# Patient Record
Sex: Male | Born: 1950 | Race: White | Hispanic: No | Marital: Married | Smoking: Never smoker
Health system: Southern US, Community
[De-identification: ages and names within clinical notes are randomized; demographics above are authoritative.]

## PROBLEM LIST (undated history)

## (undated) DIAGNOSIS — E782 Mixed hyperlipidemia: Secondary | ICD-10-CM

## (undated) DIAGNOSIS — N501 Vascular disorders of male genital organs: Secondary | ICD-10-CM

## (undated) DIAGNOSIS — G44229 Chronic tension-type headache, not intractable: Secondary | ICD-10-CM

## (undated) DIAGNOSIS — L409 Psoriasis, unspecified: Secondary | ICD-10-CM

## (undated) DIAGNOSIS — E1140 Type 2 diabetes mellitus with diabetic neuropathy, unspecified: Secondary | ICD-10-CM

## (undated) DIAGNOSIS — I10 Essential (primary) hypertension: Secondary | ICD-10-CM

## (undated) HISTORY — DX: Chronic tension-type headache, not intractable: G44.229

## (undated) HISTORY — DX: Type 2 diabetes mellitus with diabetic neuropathy, unspecified: E11.40

## (undated) HISTORY — DX: Mixed hyperlipidemia: E78.2

## (undated) HISTORY — DX: Essential (primary) hypertension: I10

## (undated) HISTORY — DX: Vascular disorders of male genital organs: N50.1

## (undated) HISTORY — PX: ANKLE SURGERY: SHX546

## (undated) HISTORY — DX: Psoriasis, unspecified: L40.9

---

## 2007-02-01 ENCOUNTER — Ambulatory Visit: Payer: Self-pay | Admitting: Internal Medicine

## 2007-03-20 ENCOUNTER — Emergency Department: Payer: Self-pay | Admitting: Emergency Medicine

## 2010-02-20 HISTORY — PX: COLECTOMY: SHX59

## 2010-05-23 ENCOUNTER — Inpatient Hospital Stay: Payer: Self-pay | Admitting: Surgery

## 2010-05-25 LAB — CEA: CEA: 1 ng/mL (ref 0.0–4.7)

## 2010-05-27 DIAGNOSIS — I472 Ventricular tachycardia: Secondary | ICD-10-CM

## 2010-05-30 LAB — PATHOLOGY REPORT

## 2010-06-25 ENCOUNTER — Inpatient Hospital Stay: Payer: Self-pay | Admitting: Internal Medicine

## 2011-01-03 LAB — HM COLONOSCOPY

## 2011-10-20 ENCOUNTER — Ambulatory Visit: Payer: Self-pay | Admitting: Internal Medicine

## 2012-01-05 ENCOUNTER — Ambulatory Visit: Payer: Self-pay | Admitting: Unknown Physician Specialty

## 2014-02-02 ENCOUNTER — Ambulatory Visit: Payer: Self-pay | Admitting: Family Medicine

## 2014-02-04 DIAGNOSIS — R51 Headache: Secondary | ICD-10-CM

## 2014-02-04 DIAGNOSIS — M5481 Occipital neuralgia: Secondary | ICD-10-CM | POA: Insufficient documentation

## 2014-02-04 DIAGNOSIS — R519 Headache, unspecified: Secondary | ICD-10-CM | POA: Insufficient documentation

## 2017-02-07 DIAGNOSIS — M1710 Unilateral primary osteoarthritis, unspecified knee: Secondary | ICD-10-CM | POA: Insufficient documentation

## 2017-02-07 DIAGNOSIS — M179 Osteoarthritis of knee, unspecified: Secondary | ICD-10-CM | POA: Insufficient documentation

## 2017-03-07 ENCOUNTER — Other Ambulatory Visit: Payer: Self-pay

## 2017-03-07 ENCOUNTER — Encounter: Payer: Self-pay | Admitting: Internal Medicine

## 2017-03-07 ENCOUNTER — Ambulatory Visit (INDEPENDENT_AMBULATORY_CARE_PROVIDER_SITE_OTHER): Payer: PPO | Admitting: Internal Medicine

## 2017-03-07 VITALS — BP 139/59 | HR 75 | Resp 16 | Ht 69.0 in | Wt 177.8 lb

## 2017-03-07 DIAGNOSIS — E1140 Type 2 diabetes mellitus with diabetic neuropathy, unspecified: Secondary | ICD-10-CM

## 2017-03-07 DIAGNOSIS — Z9049 Acquired absence of other specified parts of digestive tract: Secondary | ICD-10-CM

## 2017-03-07 DIAGNOSIS — I152 Hypertension secondary to endocrine disorders: Secondary | ICD-10-CM | POA: Insufficient documentation

## 2017-03-07 DIAGNOSIS — I10 Essential (primary) hypertension: Secondary | ICD-10-CM

## 2017-03-07 DIAGNOSIS — E781 Pure hyperglyceridemia: Secondary | ICD-10-CM

## 2017-03-07 DIAGNOSIS — Z125 Encounter for screening for malignant neoplasm of prostate: Secondary | ICD-10-CM | POA: Diagnosis not present

## 2017-03-07 DIAGNOSIS — E785 Hyperlipidemia, unspecified: Secondary | ICD-10-CM | POA: Insufficient documentation

## 2017-03-07 LAB — POCT GLYCOSYLATED HEMOGLOBIN (HGB A1C): Hemoglobin A1C: 6.7

## 2017-03-07 MED ORDER — GABAPENTIN 300 MG PO CAPS: 600.0000 mg | ORAL_CAPSULE | Freq: Three times a day (TID) | ORAL | 6 refills | Status: DC

## 2017-03-07 MED ORDER — GLIMEPIRIDE 1 MG PO TABS: ORAL_TABLET | ORAL | 3 refills | Status: DC

## 2017-03-07 MED ORDER — METOPROLOL TARTRATE 50 MG PO TABS: 50.0000 mg | ORAL_TABLET | Freq: Two times a day (BID) | ORAL | 6 refills | Status: DC

## 2017-03-07 MED ORDER — METOPROLOL SUCCINATE ER 50 MG PO TB24: 50.0000 mg | ORAL_TABLET | Freq: Two times a day (BID) | ORAL | 6 refills | Status: DC

## 2017-03-07 MED ORDER — GEMFIBROZIL 600 MG PO TABS: 600.0000 mg | ORAL_TABLET | Freq: Two times a day (BID) | ORAL | 6 refills | Status: DC

## 2017-03-07 MED ORDER — LISINOPRIL 10 MG PO TABS: 10.0000 mg | ORAL_TABLET | Freq: Every day | ORAL | 6 refills | Status: DC

## 2017-03-07 MED ORDER — GABAPENTIN 600 MG PO TABS: 600.0000 mg | ORAL_TABLET | Freq: Three times a day (TID) | ORAL | 6 refills | Status: DC

## 2017-03-07 NOTE — Telephone Encounter (Signed)
Called in phar freestyle teststrips and lancets 28 g with 10o with 3 refill as per dr [facility]

## 2017-03-07 NOTE — Progress Notes (Signed)
[facility] [place], [place] [address]  Internal MEDICINE  Office Visit Note  Patient Name: Shawn Greene  [id number]  [id number]  Date of Service: 03/07/2017   Hyperglycemia  Other  This is a new problem. The current episode started more than 1 month ago. The problem occurs intermittently. The problem has been gradually worsening. Associated symptoms include arthralgias. Pertinent negatives include no fatigue. The symptoms are aggravated by eating. The treatment provided no relief.    Pt is here for routine follow up.    Current Medication: Outpatient Encounter Medications as of 03/07/2017  Medication Sig  . gabapentin (NEURONTIN) 300 MG capsule Take 600 mg by mouth.  Marland Kitchen aspirin EC 81 MG tablet Take 81 mg by mouth.  Marland Kitchen gemfibrozil (LOPID) 600 MG tablet Take 600 mg by mouth.  Marland Kitchen lisinopril (PRINIVIL,ZESTRIL) 10 MG tablet Take 10 mg by mouth.  . metoprolol succinate (TOPROL-XL) 50 MG 24 hr tablet Take 50 mg by mouth 2 (two) times daily.   No facility-administered encounter medications on file as of 03/07/2017.     Surgical History: Past Surgical History:  Procedure Laterality Date  . ANKLE SURGERY    . COLECTOMY  2012    Medical History: Past Medical History:  Diagnosis Date  . Chronic tension-type headache, not intractable   . Essential (primary) hypertension   . Mixed hyperlipidemia   . Psoriasis   . Type 2 diabetes, controlled, with neuropathy ([place])   . 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 Hypertension Mother     Social History   Socioeconomic History  . Marital status: Married    Spouse name: Not on file  . Number of children: Not on file  . Years of education: Not on file  . Highest education level: Not on file  Social Needs  . Financial resource strain: Not on file  . Food insecurity - worry: Not on file  . Food insecurity - inability: Not on file  .  Transportation needs - medical: Not on file  . Transportation needs - non-medical: Not on file  Occupational History  . Not on file  Tobacco Use  . Smoking status: Never Smoker  . Smokeless tobacco: Never Used  Substance and Sexual Activity  . Alcohol use: Yes    Alcohol/week: 0.6 oz    Types: 1 Cans of beer per week    Comment: occational  . Drug use: No  . Sexual activity: Not on file  Other Topics Concern  . Not on file  Social History Narrative  . Not on file      Review of Systems  Constitutional: Negative.  Negative for fatigue.  HENT: Negative.   Eyes: Negative.   Respiratory: Negative.   Cardiovascular: Negative.   Musculoskeletal: Positive for arthralgias.    Vital Signs: BP (!) 139/59 (BP Location: Left Arm, Patient Position: Sitting)   Pulse 75   Resp 16   Ht 5\' 9"  (1.753 m)   Wt 177 lb 12.8 oz (80.6 kg)   SpO2 97%   BMI 26.26 kg/m    Physical Exam  Constitutional: He is oriented to person, place, and time. He appears well-developed and well-nourished. No distress.  HENT:  Head: Normocephalic and atraumatic.  Mouth/Throat: Oropharynx is clear and moist. No oropharyngeal exudate.  Eyes: EOM are normal. Pupils are equal, round, and reactive to light.  Neck: Normal range of motion. Neck  supple.  Cardiovascular: Normal rate, regular rhythm and normal heart sounds.  Pulmonary/Chest: Effort normal. He has no rales.  Abdominal: Soft. Bowel sounds are normal.  Neurological: He is alert and oriented to person, place, and time. No cranial nerve deficit.  Skin: Skin is warm and dry. He is not diaphoretic.  Psychiatric: He has a normal mood and affect.     Assessment/Plan: 1. Type 2 diabetes mellitus with diabetic neuropathy, without long-term current use of insulin (HCC)  - B12 for neuropthy - Continue gabapentin (NEURONTIN) 300 MG capsule; Take 2 capsules (600 mg total) by mouth 3 (three) times daily.  Dispense: 90 capsule; Refill: 6 - Start   glimepiride (AMARYL) 1 MG tablet; Take one tab po qd with lunch  Dispense: 30 tablet; Refill: 3 - CBC with Differential/Platelet - TSH - T4, free - Urinalysis  2. Essential hypertension, benign - Continue - metoprolol succinate (TOPROL-XL) 50 MG 24 hr tablet; Take 1 tablet (50 mg total) by mouth 2 (two) times daily.  Dispense: 60 tablet; Refill: 6 - lisinopril (PRINIVIL,ZESTRIL) 10 MG tablet; Take 1 tablet (10 mg total) by mouth daily.  Dispense: 30 tablet; Refill: 6 - Comprehensive metabolic panel  3. S/P partial resection of colon - Stable, pt developed a diverticular abscess   4. Pure hyperglyceridemia - Stable  - gemfibrozil (LOPID) 600 MG tablet; Take 1 tablet (600 mg total) by mouth 2 (two) times daily before a meal.  Dispense: 60 tablet; Refill: 6 - Lipid Panel With LDL/HDL Ratio  5. Screening PSA (prostate specific antigen) - PSA, total and free with CPE  General Counseling: yonathan perrow understanding of the findings of todays visit and agrees with plan of treatment. I have discussed any further diagnostic evaluation that may be needed or ordered today. We also reviewed his medications today. he has been encouraged to call the office with any questions or concerns that should arise related to todays visit.    Counseling: Diabetes Counseling:  1. Addition of ACE inh/ ARB'S for nephroprotection. 2. Diabetic foot care, prevention of complications.  3.Exercise and lose weight.  4. Diabetic eye examination, 5. Monitor blood sugar closlely. nutrition counseling.  6.Sign and symptoms of hypoglycemia including shaking sweating,confusion and headaches.   Dr [name] Internal medicine

## 2017-03-08 ENCOUNTER — Other Ambulatory Visit: Payer: Self-pay

## 2017-03-08 ENCOUNTER — Other Ambulatory Visit: Payer: Self-pay | Admitting: Internal Medicine

## 2017-03-08 DIAGNOSIS — I10 Essential (primary) hypertension: Secondary | ICD-10-CM

## 2017-03-08 MED ORDER — LISINOPRIL 20 MG PO TABS: ORAL_TABLET | ORAL | 6 refills | Status: DC

## 2017-03-08 MED ORDER — GLUCOCOM BLOOD GLUCOSE MONITOR DEVI: 1 refills | Status: DC

## 2017-03-09 ENCOUNTER — Ambulatory Visit: Payer: Self-pay | Admitting: Nurse Practitioner

## 2017-03-09 ENCOUNTER — Other Ambulatory Visit: Payer: Self-pay

## 2017-03-13 ENCOUNTER — Emergency Department
Admission: EM | Admit: 2017-03-13 | Discharge: 2017-03-13 | Discharge status: Absent without leave | Payer: PPO | Attending: Emergency Medicine | Admitting: Emergency Medicine

## 2017-03-15 LAB — VITAMIN B12: Vitamin B-12: 1192 pg/mL (ref 232–1245)

## 2017-05-30 LAB — CBC WITH DIFFERENTIAL/PLATELET
Basophils Absolute: 0.1 10*3/uL (ref 0.0–0.2)
Basos: 1 %
EOS (ABSOLUTE): 0.4 10*3/uL (ref 0.0–0.4)
Eos: 6 %
Hematocrit: 39.8 % (ref 37.5–51.0)
Hemoglobin: 14 g/dL (ref 13.0–17.7)
Immature Grans (Abs): 0 10*3/uL (ref 0.0–0.1)
Immature Granulocytes: 0 %
Lymphocytes Absolute: 2.4 10*3/uL (ref 0.7–3.1)
Lymphs: 41 %
MCH: 30.2 pg (ref 26.6–33.0)
MCHC: 35.2 g/dL (ref 31.5–35.7)
MCV: 86 fL (ref 79–97)
Monocytes Absolute: 0.7 10*3/uL (ref 0.1–0.9)
Monocytes: 12 %
Neutrophils Absolute: 2.3 10*3/uL (ref 1.4–7.0)
Neutrophils: 40 %
Platelets: 318 10*3/uL (ref 150–379)
RBC: 4.63 x10E6/uL (ref 4.14–5.80)
RDW: 12.9 % (ref 12.3–15.4)
WBC: 5.8 10*3/uL (ref 3.4–10.8)

## 2017-05-30 LAB — COMPREHENSIVE METABOLIC PANEL
ALT: 22 IU/L (ref 0–44)
AST: 20 IU/L (ref 0–40)
Albumin/Globulin Ratio: 1.5 (ref 1.2–2.2)
Albumin: 4.3 g/dL (ref 3.6–4.8)
Alkaline Phosphatase: 41 IU/L (ref 39–117)
BUN/Creatinine Ratio: 22 (ref 10–24)
BUN: 20 mg/dL (ref 8–27)
Bilirubin Total: 0.4 mg/dL (ref 0.0–1.2)
CO2: 21 mmol/L (ref 20–29)
Calcium: 9.5 mg/dL (ref 8.6–10.2)
Chloride: 102 mmol/L (ref 96–106)
Creatinine, Ser: 0.93 mg/dL (ref 0.76–1.27)
GFR calc Af Amer: 98 mL/min/{1.73_m2} (ref >59)
GFR calc non Af Amer: 85 mL/min/{1.73_m2} (ref >59)
Globulin, Total: 2.8 g/dL (ref 1.5–4.5)
Glucose: 99 mg/dL (ref 65–99)
Potassium: 4 mmol/L (ref 3.5–5.2)
Sodium: 138 mmol/L (ref 134–144)
Total Protein: 7.1 g/dL (ref 6.0–8.5)

## 2017-05-30 LAB — TSH: TSH: 2.44 u[IU]/mL (ref 0.450–4.500)

## 2017-05-30 LAB — PSA, TOTAL AND FREE
PSA, Free Pct: 36.7 %
PSA, Free: 0.33 ng/mL
Prostate Specific Ag, Serum: 0.9 ng/mL (ref 0.0–4.0)

## 2017-05-30 LAB — LIPID PANEL WITH LDL/HDL RATIO
Cholesterol, Total: 149 mg/dL (ref 100–199)
HDL: 33 mg/dL — ABNORMAL LOW (ref >39)
LDL Calculated: 88 mg/dL (ref 0–99)
LDl/HDL Ratio: 2.7 ratio (ref 0.0–3.6)
Triglycerides: 141 mg/dL (ref 0–149)
VLDL Cholesterol Cal: 28 mg/dL (ref 5–40)

## 2017-05-30 LAB — T4, FREE: Free T4: 1.15 ng/dL (ref 0.82–1.77)

## 2017-06-05 ENCOUNTER — Encounter: Payer: Self-pay | Admitting: Nurse Practitioner

## 2017-06-05 ENCOUNTER — Ambulatory Visit (INDEPENDENT_AMBULATORY_CARE_PROVIDER_SITE_OTHER): Payer: PPO | Admitting: Nurse Practitioner

## 2017-06-05 VITALS — BP 145/75 | HR 64 | Resp 16 | Ht 69.0 in | Wt 178.0 lb

## 2017-06-05 DIAGNOSIS — N401 Enlarged prostate with lower urinary tract symptoms: Secondary | ICD-10-CM | POA: Insufficient documentation

## 2017-06-05 DIAGNOSIS — E781 Pure hyperglyceridemia: Secondary | ICD-10-CM

## 2017-06-05 DIAGNOSIS — N400 Enlarged prostate without lower urinary tract symptoms: Secondary | ICD-10-CM | POA: Diagnosis not present

## 2017-06-05 DIAGNOSIS — Z0001 Encounter for general adult medical examination with abnormal findings: Secondary | ICD-10-CM

## 2017-06-05 DIAGNOSIS — R300 Dysuria: Secondary | ICD-10-CM

## 2017-06-05 DIAGNOSIS — E1140 Type 2 diabetes mellitus with diabetic neuropathy, unspecified: Secondary | ICD-10-CM | POA: Diagnosis not present

## 2017-06-05 DIAGNOSIS — I10 Essential (primary) hypertension: Secondary | ICD-10-CM

## 2017-06-05 DIAGNOSIS — E1165 Type 2 diabetes mellitus with hyperglycemia: Secondary | ICD-10-CM | POA: Diagnosis not present

## 2017-06-05 LAB — POCT GLYCOSYLATED HEMOGLOBIN (HGB A1C): Hemoglobin A1C: 5.7

## 2017-06-05 MED ORDER — TADALAFIL 5 MG PO TABS: 5.0000 mg | ORAL_TABLET | Freq: Every day | ORAL | 5 refills | Status: DC

## 2017-06-05 MED ORDER — METOPROLOL TARTRATE 50 MG PO TABS: 50.0000 mg | ORAL_TABLET | Freq: Two times a day (BID) | ORAL | 6 refills | Status: DC

## 2017-06-05 MED ORDER — GEMFIBROZIL 600 MG PO TABS: 600.0000 mg | ORAL_TABLET | Freq: Two times a day (BID) | ORAL | 6 refills | Status: DC

## 2017-06-05 MED ORDER — GABAPENTIN 600 MG PO TABS: 600.0000 mg | ORAL_TABLET | Freq: Three times a day (TID) | ORAL | 6 refills | Status: DC

## 2017-06-05 MED ORDER — GLIMEPIRIDE 1 MG PO TABS: ORAL_TABLET | ORAL | 3 refills | Status: DC

## 2017-06-05 MED ORDER — LISINOPRIL 20 MG PO TABS
ORAL_TABLET | ORAL | 6 refills | Status: DC
Start: 2017-06-05 — End: 2017-10-10

## 2017-06-05 NOTE — Progress Notes (Signed)
[facility] [place], [place] [address]  Internal MEDICINE  Office Visit Note  Patient Name: Shawn Greene  [id number]  [id number]  Date of Service: 06/05/2017  Chief Complaint  Patient presents with  . Diabetes    peripheral neuropathy     The patient is here for health maintenance exam. Today, he states that his blood sugars are doing well. Was started on glimepiride 1mg  daily. Has noted sugars are improved. States that even neuropathy in both feet has improved just a little.  He did see orthopedics due to left knee pain. Was given cortisone injection. This relieved a small amount of the pain, but started coming right back after a week or so. Will make another appointment for follow up.   Pt is here for routine health maintenance examination  Current Medication: Outpatient Encounter Medications as of 06/05/2017  Medication Sig  . aspirin EC 81 MG tablet Take 81 mg by mouth.  . Blood Glucose Monitoring Suppl (GLUCOCOM BLOOD GLUCOSE MONITOR) DEVI FREESTYLE LITE METER.  use as directed to check blood sugars  . gabapentin (NEURONTIN) 600 MG tablet Take 1 tablet (600 mg total) by mouth 3 (three) times daily.  Marland Kitchen gemfibrozil (LOPID) 600 MG tablet Take 1 tablet (600 mg total) by mouth 2 (two) times daily before a meal.  . glimepiride (AMARYL) 1 MG tablet Take one tab po qd with lunch  . glucose blood test strip 1 each by Other route daily. Use once a daily to check blood sugar (free style test strips )  . Lancets 28G MISC by Does not apply route daily.  Marland Kitchen lisinopril (PRINIVIL,ZESTRIL) 20 MG tablet Take one tab po qd  . metoprolol tartrate (LOPRESSOR) 50 MG tablet Take 1 tablet (50 mg total) by mouth 2 (two) times daily.  . tadalafil (CIALIS) 5 MG tablet Take 1 tablet (5 mg total) by mouth daily.  . [DISCONTINUED] gabapentin (NEURONTIN) 600 MG tablet Take 1 tablet (600 mg total) by mouth 3 (three) times daily.  . [DISCONTINUED] gemfibrozil (LOPID) 600 MG tablet  Take 1 tablet (600 mg total) by mouth 2 (two) times daily before a meal.  . [DISCONTINUED] glimepiride (AMARYL) 1 MG tablet Take one tab po qd with lunch  . [DISCONTINUED] lisinopril (PRINIVIL,ZESTRIL) 20 MG tablet Take one tab po qd  . [DISCONTINUED] metoprolol tartrate (LOPRESSOR) 50 MG tablet Take 1 tablet (50 mg total) by mouth 2 (two) times daily.   No facility-administered encounter medications on file as of 06/05/2017.     Surgical History: Past Surgical History:  Procedure Laterality Date  . ANKLE SURGERY    . COLECTOMY  2012    Medical History: Past Medical History:  Diagnosis Date  . Chronic tension-type headache, not intractable   . Essential (primary) hypertension   . Mixed hyperlipidemia   . Psoriasis   . Type 2 diabetes, controlled, with neuropathy ([place])   . 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 Hypertension Mother       Review of Systems  Constitutional: Negative for activity change, chills, fatigue and unexpected weight change.  HENT: Negative for congestion, postnasal drip, rhinorrhea, sneezing and sore throat.   Eyes: Negative.  Negative for redness.  Respiratory: Negative for cough, chest tightness, shortness of breath and wheezing.   Cardiovascular: Negative for chest pain and palpitations.  Gastrointestinal: Negative for abdominal pain, constipation, diarrhea, nausea and vomiting.  Endocrine: Negative  for cold intolerance, heat intolerance, polydipsia, polyphagia and polyuria.       Blood sugars doing well   Genitourinary: Negative for dysuria and frequency.  Musculoskeletal: Positive for arthralgias. Negative for back pain, joint swelling and neck pain.       Left knee pain  Skin: Negative for rash.  Allergic/Immunologic: Negative for environmental allergies.  Neurological: Positive for numbness. Negative for tremors.  Hematological: Negative for adenopathy. Does not  bruise/bleed easily.  Psychiatric/Behavioral: Negative for behavioral problems (Depression), dysphoric mood, sleep disturbance and suicidal ideas. The patient is not nervous/anxious.      Today's Vitals   06/05/17 0913  BP: (!) 145/75  Pulse: 64  Resp: 16  SpO2: 98%  Weight: 178 lb (80.7 kg)  Height: 5\' 9"  (1.753 m)   Physical Exam  Constitutional: He is oriented to person, place, and time. He appears well-developed and well-nourished. No distress.  HENT:  Head: Normocephalic and atraumatic.  Mouth/Throat: Oropharynx is clear and moist. No oropharyngeal exudate.  Eyes: Pupils are equal, round, and reactive to light. Conjunctivae and EOM are normal.  Neck: Normal range of motion. Neck supple. No JVD present. Carotid bruit is not present.  Cardiovascular: Normal rate, regular rhythm, normal heart sounds and intact distal pulses.  Pulmonary/Chest: Effort normal and breath sounds normal. He has no wheezes. He has no rales.  Abdominal: Soft. Bowel sounds are normal. There is no tenderness.  Musculoskeletal: Normal range of motion.  Neurological: He is alert and oriented to person, place, and time. No cranial nerve deficit.  Skin: Skin is warm and dry. Capillary refill takes 2 to 3 seconds. He is not diaphoretic.  Psychiatric: He has a normal mood and affect. His behavior is normal. Judgment and thought content normal.  Nursing note and vitals reviewed.    LABS: Recent Results (from the past 2160 hour(s))  B12     Status: None   Collection Time: 03/14/17  8:27 AM  Result Value Ref Range   Vitamin B-12 1,192 232 - 1,245 pg/mL  CBC with Differential/Platelet     Status: None   Collection Time: 05/29/17  8:38 AM  Result Value Ref Range   WBC 5.8 3.4 - 10.8 x10E3/uL   RBC 4.63 4.14 - 5.80 x10E6/uL   Hemoglobin 14.0 13.0 - 17.7 g/dL   Hematocrit 39.8 37.5 - 51.0 %   MCV 86 79 - 97 fL   MCH 30.2 26.6 - 33.0 pg   MCHC 35.2 31.5 - 35.7 g/dL   RDW 12.9 12.3 - 15.4 %   Platelets 318  150 - 379 x10E3/uL   Neutrophils 40 Not Estab. %   Lymphs 41 Not Estab. %   Monocytes 12 Not Estab. %   Eos 6 Not Estab. %   Basos 1 Not Estab. %   Neutrophils Absolute 2.3 1.4 - 7.0 x10E3/uL   Lymphocytes Absolute 2.4 0.7 - 3.1 x10E3/uL   Monocytes Absolute 0.7 0.1 - 0.9 x10E3/uL   EOS (ABSOLUTE) 0.4 0.0 - 0.4 x10E3/uL   Basophils Absolute 0.1 0.0 - 0.2 x10E3/uL   Immature Granulocytes 0 Not Estab. %   Immature Grans (Abs) 0.0 0.0 - 0.1 x10E3/uL  Lipid Panel With LDL/HDL Ratio     Status: Abnormal   Collection Time: 05/29/17  8:38 AM  Result Value Ref Range   Cholesterol, Total 149 100 - 199 mg/dL   Triglycerides 141 0 - 149 mg/dL   HDL 33 (L) >39 mg/dL   VLDL Cholesterol Cal 28 5 - 40  mg/dL   LDL Calculated 88 0 - 99 mg/dL   LDl/HDL Ratio 2.7 0.0 - 3.6 ratio    Comment:                                     LDL/HDL Ratio                                             Men  Women                               1/2 Avg.Risk  1.0    1.5                                   Avg.Risk  3.6    3.2                                2X Avg.Risk  6.2    5.0                                3X Avg.Risk  8.0    6.1   TSH     Status: None   Collection Time: 05/29/17  8:38 AM  Result Value Ref Range   TSH 2.440 0.450 - 4.500 uIU/mL  T4, free     Status: None   Collection Time: 05/29/17  8:38 AM  Result Value Ref Range   Free T4 1.15 0.82 - 1.77 ng/dL  Comprehensive metabolic panel     Status: None   Collection Time: 05/29/17  8:38 AM  Result Value Ref Range   Glucose 99 65 - 99 mg/dL   BUN 20 8 - 27 mg/dL   Creatinine, Ser 0.93 0.76 - 1.27 mg/dL   GFR calc non Af Amer 85 >59 mL/min/1.73   GFR calc Af Amer 98 >59 mL/min/1.73   BUN/Creatinine Ratio 22 10 - 24   Sodium 138 134 - 144 mmol/L   Potassium 4.0 3.5 - 5.2 mmol/L   Chloride 102 96 - 106 mmol/L   CO2 21 20 - 29 mmol/L   Calcium 9.5 8.6 - 10.2 mg/dL   Total Protein 7.1 6.0 - 8.5 g/dL   Albumin 4.3 3.6 - 4.8 g/dL   Globulin, Total 2.8  1.5 - 4.5 g/dL   Albumin/Globulin Ratio 1.5 1.2 - 2.2   Bilirubin Total 0.4 0.0 - 1.2 mg/dL   Alkaline Phosphatase 41 39 - 117 IU/L   AST 20 0 - 40 IU/L   ALT 22 0 - 44 IU/L  PSA, total and free     Status: None   Collection Time: 05/29/17  8:38 AM  Result Value Ref Range   Prostate Specific Ag, Serum 0.9 0.0 - 4.0 ng/mL    Comment: Roche ECLIA methodology. According to the American Urological Association, Serum PSA should decrease and remain at undetectable levels after radical prostatectomy. The AUA defines biochemical recurrence as an initial PSA value 0.2 ng/mL or greater followed by a subsequent confirmatory PSA value 0.2 ng/mL or greater. Values obtained with different assay  methods or kits cannot be used interchangeably. Results cannot be interpreted as absolute evidence of the presence or absence of malignant disease.    PSA, Free 0.33 N/A ng/mL    Comment: Roche ECLIA methodology.   PSA, Free Pct 36.7 %    Comment: The table below lists the probability of prostate cancer for men with non-suspicious DRE results and total PSA between 4 and 10 ng/mL, by patient age Ricci Barker, [place], [id number]).                   % Free PSA       50-64 yr        65-75 yr                   0.00-10.00%        56%             55%                  10.01-15.00%        24%             35%                  15.01-20.00%        17%             23%                  20.01-25.00%        10%             20%                       >25.00%         5%              9% Please note:  Catalona et al did not make specific               recommendations regarding the use of               percent free PSA for any other population               of men.   POCT HgB A1C     Status: None   Collection Time: 06/05/17  9:39 AM  Result Value Ref Range   Hemoglobin A1C 5.7    Assessment/Plan: 1. Encounter for general adult medical examination with abnormal findings Annual wellness visit today.  2. Type 2  diabetes mellitus with diabetic neuropathy, without long-term current use of insulin ([place]) Diabetes well controlled. Tolerating amaryl well. Continue daily. Continue gabapentin as prescribed  - gabapentin (NEURONTIN) 600 MG tablet; Take 1 tablet (600 mg total) by mouth 3 (three) times daily.  Dispense: 90 tablet; Refill: 6 - glimepiride (AMARYL) 1 MG tablet; Take one tab po qd with lunch  Dispense: 30 tablet; Refill: 3  3. Uncontrolled type 2 diabetes mellitus with hyperglycemia (HCC) - POCT HgB A1C 5.7 today. Continue amaryl as prescribed   4. Essential hypertension, benign Well controlled. Continue bp medications as prescribed  - lisinopril (PRINIVIL,ZESTRIL) 20 MG tablet; Take one tab po qd  Dispense: 30 tablet; Refill: 6 - metoprolol tartrate (LOPRESSOR) 50 MG tablet; Take 1 tablet (50 mg total) by mouth 2 (two) times daily.  Dispense: 60 tablet; Refill: 6  5. Pure hyperglyceridemia - gemfibrozil (LOPID) 600 MG tablet; Take 1 tablet (600 mg total) by  mouth 2 (two) times daily before a meal.  Dispense: 60 tablet; Refill: 6  6. Benign prostatic hyperplasia without lower urinary tract symptoms - tadalafil (CIALIS) 5 MG tablet; Take 1 tablet (5 mg total) by mouth daily.  Dispense: 30 tablet; Refill: 5  7. Dysuria - Urinalysis, Routine w reflex microscopic  General Counseling: Shawn Greene understanding of the findings of todays visit and agrees with plan of treatment. I have discussed any further diagnostic evaluation that may be needed or ordered today. We also reviewed his medications today. he has been encouraged to call the office with any questions or concerns that should arise related to todays visit.  Diabetes Counseling:  1. Addition of ACE inh/ ARB'S for nephroprotection. 2. Diabetic foot care, prevention of complications.  3.Exercise and lose weight.  4. Diabetic eye examination, 5. Monitor blood sugar closlely. nutrition counseling.  6.Sign and symptoms of hypoglycemia  including shaking sweating,confusion and headaches.   This patient was seen by Leretha Pol, FNP- C in Collaboration with Dr [name] as a part of collaborative care agreement    Orders Placed This Encounter  Procedures  . Urinalysis, Routine w reflex microscopic  . POCT HgB A1C    Meds ordered this encounter  Medications  . gabapentin (NEURONTIN) 600 MG tablet    Sig: Take 1 tablet (600 mg total) by mouth 3 (three) times daily.    Dispense:  90 tablet    Refill:  6    Order Specific Question:   Supervising Provider    Answer:   [name] [[id number]]  . gemfibrozil (LOPID) 600 MG tablet    Sig: Take 1 tablet (600 mg total) by mouth 2 (two) times daily before a meal.    Dispense:  60 tablet    Refill:  6    Order Specific Question:   Supervising Provider    Answer:   [name] [place]  . glimepiride (AMARYL) 1 MG tablet    Sig: Take one tab po qd with lunch    Dispense:  30 tablet    Refill:  3    Order Specific Question:   Supervising Provider    Answer:   [name] [place]  . lisinopril (PRINIVIL,ZESTRIL) 20 MG tablet    Sig: Take one tab po qd    Dispense:  30 tablet    Refill:  6    Order Specific Question:   Supervising Provider    Answer:   [name] [[id number]]  . metoprolol tartrate (LOPRESSOR) 50 MG tablet    Sig: Take 1 tablet (50 mg total) by mouth 2 (two) times daily.    Dispense:  60 tablet    Refill:  6    Order Specific Question:   Supervising Provider    Answer:   [name] [[id number]]  . tadalafil (CIALIS) 5 MG tablet    Sig: Take 1 tablet (5 mg total) by mouth daily.    Dispense:  30 tablet    Refill:  5    Order Specific Question:   Supervising Provider    Answer:   [name] [[id number]]    Time spent: [place], MD  Internal Medicine

## 2017-06-06 LAB — URINALYSIS, ROUTINE W REFLEX MICROSCOPIC
Bilirubin, UA: NEGATIVE
Glucose, UA: NEGATIVE
Ketones, UA: NEGATIVE
Leukocytes, UA: NEGATIVE
Nitrite, UA: NEGATIVE
Protein, UA: NEGATIVE
RBC, UA: NEGATIVE
Specific Gravity, UA: 1.023 (ref 1.005–1.030)
Urobilinogen, Ur: 0.2 mg/dL (ref 0.2–1.0)
pH, UA: 5.5 (ref 5.0–7.5)

## 2017-10-08 ENCOUNTER — Ambulatory Visit: Payer: Self-pay | Admitting: Nurse Practitioner

## 2017-10-10 ENCOUNTER — Encounter: Payer: Self-pay | Admitting: Nurse Practitioner

## 2017-10-10 ENCOUNTER — Ambulatory Visit (INDEPENDENT_AMBULATORY_CARE_PROVIDER_SITE_OTHER): Payer: PPO | Admitting: Nurse Practitioner

## 2017-10-10 VITALS — BP 142/68 | HR 74 | Resp 16 | Ht 69.0 in | Wt 177.0 lb

## 2017-10-10 DIAGNOSIS — E781 Pure hyperglyceridemia: Secondary | ICD-10-CM

## 2017-10-10 DIAGNOSIS — N400 Enlarged prostate without lower urinary tract symptoms: Secondary | ICD-10-CM

## 2017-10-10 DIAGNOSIS — E1140 Type 2 diabetes mellitus with diabetic neuropathy, unspecified: Secondary | ICD-10-CM | POA: Diagnosis not present

## 2017-10-10 DIAGNOSIS — I10 Essential (primary) hypertension: Secondary | ICD-10-CM

## 2017-10-10 DIAGNOSIS — E1165 Type 2 diabetes mellitus with hyperglycemia: Secondary | ICD-10-CM

## 2017-10-10 LAB — POCT GLYCOSYLATED HEMOGLOBIN (HGB A1C): Hemoglobin A1C: 5.3 % (ref 4.0–5.6)

## 2017-10-10 MED ORDER — GABAPENTIN ENACARBIL ER 600 MG PO TBCR: 600.0000 mg | EXTENDED_RELEASE_TABLET | Freq: Two times a day (BID) | ORAL | 3 refills | Status: DC

## 2017-10-10 MED ORDER — LISINOPRIL 20 MG PO TABS: ORAL_TABLET | ORAL | 6 refills | Status: DC

## 2017-10-10 MED ORDER — GLIMEPIRIDE 1 MG PO TABS: ORAL_TABLET | ORAL | 3 refills | Status: DC

## 2017-10-10 MED ORDER — GEMFIBROZIL 600 MG PO TABS: 600.0000 mg | ORAL_TABLET | Freq: Two times a day (BID) | ORAL | 6 refills | Status: DC

## 2017-10-10 MED ORDER — GABAPENTIN 600 MG PO TABS: 600.0000 mg | ORAL_TABLET | Freq: Three times a day (TID) | ORAL | 6 refills | Status: DC

## 2017-10-10 MED ORDER — TADALAFIL 5 MG PO TABS: 5.0000 mg | ORAL_TABLET | Freq: Every day | ORAL | 5 refills | Status: DC

## 2017-10-10 MED ORDER — METOPROLOL TARTRATE 50 MG PO TABS: 50.0000 mg | ORAL_TABLET | Freq: Two times a day (BID) | ORAL | 6 refills | Status: DC

## 2017-10-10 NOTE — Progress Notes (Signed)
[facility] [place], [place] [address]  Internal MEDICINE  Office Visit Note  Patient Name: Shawn Greene  [id number]  [id number]  Date of Service: 10/10/2017  Chief Complaint  Patient presents with  . Diabetes    4 month follow up  . Peripheral Neuropathy    bilateral lower legs and feet    The patient reports continued and worsening neuropathy in both lower legs and feet. Constantly feel numb and tingly. Makes walking a little difficult.   Diabetes  He presents for his follow-up diabetic visit. He has type 2 diabetes mellitus. No MedicAlert identification noted. There are no hypoglycemic associated symptoms. Pertinent negatives for hypoglycemia include no nervousness/anxiousness or tremors. Associated symptoms include foot paresthesias. Pertinent negatives for diabetes include no chest pain, no fatigue, no polydipsia, no polyphagia and no polyuria. There are no hypoglycemic complications. Symptoms are stable. Diabetic complications include peripheral neuropathy. Risk factors for coronary artery disease include diabetes mellitus, dyslipidemia, male sex and stress. Current diabetic treatment includes oral agent (monotherapy). He is compliant with treatment all of the time. He is following a generally healthy diet. Meal planning includes avoidance of concentrated sweets. He has not had a previous visit with a dietitian. He participates in exercise intermittently. There is no change in his home blood glucose trend. An ACE inhibitor/angiotensin II receptor blocker is being taken. He does not see a podiatrist.Eye exam is current.       Current Medication: Outpatient Encounter Medications as of 10/10/2017  Medication Sig  . aspirin EC 81 MG tablet Take 81 mg by mouth.  . Blood Glucose Monitoring Suppl (GLUCOCOM BLOOD GLUCOSE MONITOR) DEVI FREESTYLE LITE METER.  use as directed to check blood sugars  . gabapentin (NEURONTIN) 600 MG tablet Take 1 tablet (600 mg total)  by mouth 3 (three) times daily.  Marland Kitchen gemfibrozil (LOPID) 600 MG tablet Take 1 tablet (600 mg total) by mouth 2 (two) times daily before a meal.  . glimepiride (AMARYL) 1 MG tablet Take one tab po qd with lunch  . glucose blood test strip 1 each by Other route daily. Use once a daily to check blood sugar (free style test strips )  . Lancets 28G MISC by Does not apply route daily.  Marland Kitchen lisinopril (PRINIVIL,ZESTRIL) 20 MG tablet Take one tab po qd  . metoprolol tartrate (LOPRESSOR) 50 MG tablet Take 1 tablet (50 mg total) by mouth 2 (two) times daily.  . tadalafil (CIALIS) 5 MG tablet Take 1 tablet (5 mg total) by mouth daily.  . [DISCONTINUED] gabapentin (NEURONTIN) 600 MG tablet Take 1 tablet (600 mg total) by mouth 3 (three) times daily.  . [DISCONTINUED] gemfibrozil (LOPID) 600 MG tablet Take 1 tablet (600 mg total) by mouth 2 (two) times daily before a meal.  . [DISCONTINUED] glimepiride (AMARYL) 1 MG tablet Take one tab po qd with lunch  . [DISCONTINUED] lisinopril (PRINIVIL,ZESTRIL) 20 MG tablet Take one tab po qd  . [DISCONTINUED] metoprolol tartrate (LOPRESSOR) 50 MG tablet Take 1 tablet (50 mg total) by mouth 2 (two) times daily.  . [DISCONTINUED] tadalafil (CIALIS) 5 MG tablet Take 1 tablet (5 mg total) by mouth daily.  . Gabapentin Enacarbil (HORIZANT) 600 MG TBCR Take 600 mg by mouth 2 (two) times daily.   No facility-administered encounter medications on file as of 10/10/2017.     Surgical History: Past Surgical History:  Procedure Laterality Date  . ANKLE SURGERY    . COLECTOMY  2012  Medical History: Past Medical History:  Diagnosis Date  . Chronic tension-type headache, not intractable   . Essential (primary) hypertension   . Mixed hyperlipidemia   . Psoriasis   . Type 2 diabetes, controlled, with neuropathy ([place])   . 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 Hypertension Mother     Social  History   Socioeconomic History  . Marital status: Married    Spouse name: Not on file  . Number of children: Not on file  . Years of education: Not on file  . Highest education level: Not on file  Occupational History  . Not on file  Social Needs  . Financial resource strain: Not on file  . Food insecurity:    Worry: Not on file    Inability: Not on file  . Transportation needs:    Medical: Not on file    Non-medical: Not on file  Tobacco Use  . Smoking status: Never Smoker  . Smokeless tobacco: Never Used  Substance and Sexual Activity  . Alcohol use: Yes    Alcohol/week: 1.0 standard drinks    Types: 1 Cans of beer per week    Comment: occasional/rarely  . Drug use: No  . Sexual activity: Not on file  Lifestyle  . Physical activity:    Days per week: Not on file    Minutes per session: Not on file  . Stress: Not on file  Relationships  . Social connections:    Talks on phone: Not on file    Gets together: Not on file    Attends religious service: Not on file    Active member of club or organization: Not on file    Attends meetings of clubs or organizations: Not on file    Relationship status: Not on file  . Intimate partner violence:    Fear of current or ex partner: Not on file    Emotionally abused: Not on file    Physically abused: Not on file    Forced sexual activity: Not on file  Other Topics Concern  . Not on file  Social History Narrative  . Not on file      Review of Systems  Constitutional: Negative for activity change, chills, fatigue and unexpected weight change.  HENT: Negative for congestion, postnasal drip, rhinorrhea, sneezing and sore throat.   Eyes: Negative.  Negative for redness.  Respiratory: Negative for cough, chest tightness, shortness of breath and wheezing.   Cardiovascular: Negative for chest pain and palpitations.  Gastrointestinal: Negative for abdominal pain, constipation, diarrhea, nausea and vomiting.  Endocrine: Negative  for cold intolerance, heat intolerance, polydipsia, polyphagia and polyuria.       Blood sugars doing well   Genitourinary: Negative for dysuria and frequency.  Musculoskeletal: Negative for arthralgias, back pain, joint swelling and neck pain.  Skin: Negative for rash.  Allergic/Immunologic: Negative for environmental allergies.  Neurological: Positive for numbness. Negative for tremors.  Hematological: Negative for adenopathy. Does not bruise/bleed easily.  Psychiatric/Behavioral: Negative for behavioral problems (Depression), dysphoric mood, sleep disturbance and suicidal ideas. The patient is not nervous/anxious.     Today's Vitals   10/10/17 1346  BP: (!) 142/68  Pulse: 74  Resp: 16  SpO2: 97%  Weight: 177 lb (80.3 kg)  Height: 5\' 9"  (1.753 m)    Physical Exam  Constitutional: He is oriented to person, place, and time. He appears well-developed and well-nourished.  No distress.  HENT:  Head: Normocephalic and atraumatic.  Nose: Nose normal.  Mouth/Throat: Oropharynx is clear and moist. No oropharyngeal exudate.  Eyes: Pupils are equal, round, and reactive to light. Conjunctivae and EOM are normal.  Neck: Normal range of motion. Neck supple. No JVD present. Carotid bruit is not present. No tracheal deviation present. No thyromegaly present.  Cardiovascular: Normal rate, regular rhythm and normal heart sounds.  Pulmonary/Chest: Effort normal and breath sounds normal. He has no wheezes. He has no rales.  Abdominal: Soft. Bowel sounds are normal. There is no tenderness.  Musculoskeletal: Normal range of motion.  Lymphadenopathy:    He has no cervical adenopathy.  Neurological: He is alert and oriented to person, place, and time. No cranial nerve deficit.  Skin: Skin is warm and dry. Capillary refill takes 2 to 3 seconds. He is not diaphoretic.  Psychiatric: He has a normal mood and affect. His behavior is normal. Judgment and thought content normal.  Nursing note and vitals  reviewed.   Assessment/Plan: 1. Uncontrolled type 2 diabetes mellitus with hyperglycemia (HCC) - POCT HgB A1C 5.5 today. Continue diabetic medication as prescribed.   2. Type 2 diabetes mellitus with diabetic neuropathy, without long-term current use of insulin (HCC) Trial of horizant 600mg  BID. Samples provided and new prescription sent to patient care pharmacy. Will go back to gabapentin if no improvement and will adjust dosing as indicated.  - gabapentin (NEURONTIN) 600 MG tablet; Take 1 tablet (600 mg total) by mouth 3 (three) times daily.  Dispense: 90 tablet; Refill: 6 - glimepiride (AMARYL) 1 MG tablet; Take one tab po qd with lunch  Dispense: 30 tablet; Refill: 3 - Gabapentin Enacarbil (HORIZANT) 600 MG TBCR; Take 600 mg by mouth 2 (two) times daily.  Dispense: 60 tablet; Refill: 3  3. Pure hyperglyceridemia Lipid panel well managed. Continue lopid as prescribed.  - gemfibrozil (LOPID) 600 MG tablet; Take 1 tablet (600 mg total) by mouth 2 (two) times daily before a meal.  Dispense: 60 tablet; Refill: 6  4. Essential hypertension, benign Generally stable. Continue bp medication as prescribed.  - lisinopril (PRINIVIL,ZESTRIL) 20 MG tablet; Take one tab po qd  Dispense: 30 tablet; Refill: 6 - metoprolol tartrate (LOPRESSOR) 50 MG tablet; Take 1 tablet (50 mg total) by mouth 2 (two) times daily.  Dispense: 60 tablet; Refill: 6  5. Benign prostatic hyperplasia without lower urinary tract symptoms - tadalafil (CIALIS) 5 MG tablet; Take 1 tablet (5 mg total) by mouth daily.  Dispense: 30 tablet; Refill: 5    General Counseling: Shawn Greene understanding of the findings of todays visit and agrees with plan of treatment. I have discussed any further diagnostic evaluation that may be needed or ordered today. We also reviewed his medications today. he has been encouraged to call the office with any questions or concerns that should arise related to todays visit.  Diabetes Counseling:   1. Addition of ACE inh/ ARB'S for nephroprotection. Microalbumin is updated  2. Diabetic foot care, prevention of complications. Podiatry consult 3. Exercise and lose weight.  4. Diabetic eye examination, Diabetic eye exam is updated  5. Monitor blood sugar closlely. nutrition counseling.  6. Sign and symptoms of hypoglycemia including shaking sweating,confusion and headaches.  This patient was seen by Leretha Pol FNP Collaboration with Dr [name] as a part of collaborative care agreement   Orders Placed This Encounter  Procedures  . POCT HgB A1C    Meds ordered this encounter  Medications  .  gabapentin (NEURONTIN) 600 MG tablet    Sig: Take 1 tablet (600 mg total) by mouth 3 (three) times daily.    Dispense:  90 tablet    Refill:  6    Order Specific Question:   Supervising Provider    Answer:   [name] [[id number]]  . gemfibrozil (LOPID) 600 MG tablet    Sig: Take 1 tablet (600 mg total) by mouth 2 (two) times daily before a meal.    Dispense:  60 tablet    Refill:  6    Order Specific Question:   Supervising Provider    Answer:   [name] [place]  . glimepiride (AMARYL) 1 MG tablet    Sig: Take one tab po qd with lunch    Dispense:  30 tablet    Refill:  3    Order Specific Question:   Supervising Provider    Answer:   [name] [place]  . lisinopril (PRINIVIL,ZESTRIL) 20 MG tablet    Sig: Take one tab po qd    Dispense:  30 tablet    Refill:  6    Order Specific Question:   Supervising Provider    Answer:   [name] [[id number]]  . metoprolol tartrate (LOPRESSOR) 50 MG tablet    Sig: Take 1 tablet (50 mg total) by mouth 2 (two) times daily.    Dispense:  60 tablet    Refill:  6    Order Specific Question:   Supervising Provider    Answer:   [name] [[id number]]  . tadalafil (CIALIS) 5 MG tablet    Sig: Take 1 tablet (5 mg total) by mouth daily.    Dispense:  30 tablet    Refill:  5    Order Specific Question:   Supervising Provider    Answer:    [name] [[id number]]  . Gabapentin Enacarbil (HORIZANT) 600 MG TBCR    Sig: Take 600 mg by mouth 2 (two) times daily.    Dispense:  60 tablet    Refill:  3    Order Specific Question:   Supervising Provider    Answer:   [name] [[id number]]    Time spent: 18 Minutes      Dr [name] Internal medicine

## 2018-02-11 ENCOUNTER — Encounter: Payer: Self-pay | Admitting: Nurse Practitioner

## 2018-02-11 ENCOUNTER — Ambulatory Visit: Payer: PPO | Admitting: Nurse Practitioner

## 2018-02-11 VITALS — BP 138/84 | HR 74 | Resp 16 | Ht 69.0 in | Wt 179.8 lb

## 2018-02-11 DIAGNOSIS — I10 Essential (primary) hypertension: Secondary | ICD-10-CM

## 2018-02-11 DIAGNOSIS — M170 Bilateral primary osteoarthritis of knee: Secondary | ICD-10-CM

## 2018-02-11 DIAGNOSIS — E1165 Type 2 diabetes mellitus with hyperglycemia: Secondary | ICD-10-CM | POA: Diagnosis not present

## 2018-02-11 DIAGNOSIS — E1140 Type 2 diabetes mellitus with diabetic neuropathy, unspecified: Secondary | ICD-10-CM | POA: Diagnosis not present

## 2018-02-11 DIAGNOSIS — E785 Hyperlipidemia, unspecified: Secondary | ICD-10-CM | POA: Insufficient documentation

## 2018-02-11 LAB — POCT GLYCOSYLATED HEMOGLOBIN (HGB A1C): Hemoglobin A1C: 5.7 % — AB (ref 4.0–5.6)

## 2018-02-11 MED ORDER — TRAMADOL HCL 50 MG PO TABS: 50.0000 mg | ORAL_TABLET | Freq: Every evening | ORAL | 1 refills | Status: DC | PRN

## 2018-02-11 MED ORDER — GLIMEPIRIDE 1 MG PO TABS: ORAL_TABLET | ORAL | 3 refills | Status: DC

## 2018-02-11 NOTE — Progress Notes (Signed)
[facility] [place], [place] [address]  Internal MEDICINE  Office Visit Note  Patient Name: Shawn Greene  [id number]  [id number]  Date of Service: 02/11/2018  Chief Complaint  Patient presents with  . Medical Management of Chronic Issues    4 month follow up  . Pain    pain in feetand knees  . Diabetes    The patient is here for routine follow up visit. Blood sugars are well controlled, with HgbA1c at 5.7 today. States that neuropathy in his feet continues to be moderate to severe. Tried the [employer] 600mg  daily to twice daily which did not work at all. Lyrica made him feel dizzy and confused and tired. Is now back on gabapentin 600mg  three times daily, but pain continues ti get worse. Pain is described as 4 to 5/10 in severity, but has moments when the pain reaches 7/10 in severity. Pain will wake him up at night due to severity.       Current Medication: Outpatient Encounter Medications as of 02/11/2018  Medication Sig  . aspirin EC 81 MG tablet Take 81 mg by mouth.  . Blood Glucose Monitoring Suppl (GLUCOCOM BLOOD GLUCOSE MONITOR) DEVI FREESTYLE LITE METER.  use as directed to check blood sugars  . gabapentin (NEURONTIN) 600 MG tablet Take 1 tablet (600 mg total) by mouth 3 (three) times daily.  Marland Kitchen gemfibrozil (LOPID) 600 MG tablet Take 1 tablet (600 mg total) by mouth 2 (two) times daily before a meal.  . glimepiride (AMARYL) 1 MG tablet Take one tab po qd with lunch  . glucose blood test strip 1 each by Other route daily. Use once a daily to check blood sugar (free style test strips )  . Lancets 28G MISC by Does not apply route daily.  Marland Kitchen lisinopril (PRINIVIL,ZESTRIL) 20 MG tablet Take one tab po qd  . metoprolol tartrate (LOPRESSOR) 50 MG tablet Take 1 tablet (50 mg total) by mouth 2 (two) times daily.  . tadalafil (CIALIS) 5 MG tablet Take 1 tablet (5 mg total) by mouth daily.  . [DISCONTINUED] Gabapentin Enacarbil (HORIZANT) 600 MG TBCR Take 600  mg by mouth 2 (two) times daily.  . [DISCONTINUED] glimepiride (AMARYL) 1 MG tablet Take one tab po qd with lunch  . traMADol (ULTRAM) 50 MG tablet Take 1 tablet (50 mg total) by mouth at bedtime as needed for moderate pain.   No facility-administered encounter medications on file as of 02/11/2018.     Surgical History: Past Surgical History:  Procedure Laterality Date  . ANKLE SURGERY    . COLECTOMY  2012    Medical History: Past Medical History:  Diagnosis Date  . Chronic tension-type headache, not intractable   . Essential (primary) hypertension   . Mixed hyperlipidemia   . Psoriasis   . Type 2 diabetes, controlled, with neuropathy ([place])   . 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 Hypertension Mother     Social History   Socioeconomic History  . Marital status: Married    Spouse name: Not on file  . Number of children: Not on file  . Years of education: Not on file  . Highest education level: Not on file  Occupational History  . Not on file  Social Needs  . Financial resource strain: Not on file  . Food insecurity:    Worry: Not on file    Inability: Not  on file  . Transportation needs:    Medical: Not on file    Non-medical: Not on file  Tobacco Use  . Smoking status: Never Smoker  . Smokeless tobacco: Never Used  Substance and Sexual Activity  . Alcohol use: Yes    Alcohol/week: 1.0 standard drinks    Types: 1 Cans of beer per week    Comment: has not had any in 3 months drinks rarely  . Drug use: No  . Sexual activity: Not on file  Lifestyle  . Physical activity:    Days per week: Not on file    Minutes per session: Not on file  . Stress: Not on file  Relationships  . Social connections:    Talks on phone: Not on file    Gets together: Not on file    Attends religious service: Not on file    Active member of club or organization: Not on file    Attends meetings of clubs or  organizations: Not on file    Relationship status: Not on file  . Intimate partner violence:    Fear of current or ex partner: Not on file    Emotionally abused: Not on file    Physically abused: Not on file    Forced sexual activity: Not on file  Other Topics Concern  . Not on file  Social History Narrative  . Not on file      Review of Systems  Constitutional: Negative for activity change, chills, fatigue and unexpected weight change.  HENT: Negative for congestion, postnasal drip, rhinorrhea, sneezing and sore throat.   Respiratory: Negative for cough, chest tightness, shortness of breath and wheezing.   Cardiovascular: Negative for chest pain and palpitations.  Gastrointestinal: Negative for abdominal pain, constipation, diarrhea, nausea and vomiting.  Endocrine: Negative for cold intolerance, heat intolerance, polydipsia, polyphagia and polyuria.       Blood sugars doing well   Musculoskeletal: Positive for arthralgias. Negative for back pain, joint swelling and neck pain.       Bilateral knee pain for which he sees orthopedics.   Skin: Negative for rash.  Allergic/Immunologic: Negative for environmental allergies.  Neurological: Positive for numbness. Negative for tremors.       Neuropathy in his feet which is worsening, especially at night.   Hematological: Negative for adenopathy. Does not bruise/bleed easily.  Psychiatric/Behavioral: Negative for behavioral problems (Depression), dysphoric mood, sleep disturbance and suicidal ideas. The patient is not nervous/anxious.     Vital Signs: BP 138/84 (BP Location: Left Arm, Patient Position: Sitting, Cuff Size: Large)   Pulse 74   Resp 16   Ht 5\' 9"  (1.753 m)   Wt 179 lb 12.8 oz (81.6 kg)   SpO2 95%   BMI 26.55 kg/m    Physical Exam Vitals signs and nursing note reviewed.  Constitutional:      General: He is not in acute distress.    Appearance: Normal appearance. He is well-developed. He is not diaphoretic.  HENT:      Head: Normocephalic and atraumatic.     Nose: Nose normal.     Mouth/Throat:     Pharynx: No oropharyngeal exudate.  Eyes:     Conjunctiva/sclera: Conjunctivae normal.     Pupils: Pupils are equal, round, and reactive to light.  Neck:     Musculoskeletal: Normal range of motion and neck supple.     Thyroid: No thyromegaly.     Vascular: No carotid bruit or JVD.  Trachea: No tracheal deviation.  Cardiovascular:     Rate and Rhythm: Normal rate and regular rhythm.     Heart sounds: Normal heart sounds.  Pulmonary:     Effort: Pulmonary effort is normal.     Breath sounds: Normal breath sounds. No wheezing or rales.  Abdominal:     General: Bowel sounds are normal.     Palpations: Abdomen is soft.     Tenderness: There is no abdominal tenderness.  Musculoskeletal: Normal range of motion.     Comments: Bilateral knee tenderness.   Lymphadenopathy:     Cervical: No cervical adenopathy.  Skin:    General: Skin is warm and dry.     Capillary Refill: Capillary refill takes 2 to 3 seconds.  Neurological:     General: No focal deficit present.     Mental Status: He is alert and oriented to person, place, and time.     Cranial Nerves: No cranial nerve deficit.  Psychiatric:        Behavior: Behavior normal.        Thought Content: Thought content normal.        Judgment: Judgment normal.   Assessment/Plan: 1. Type 2 diabetes mellitus with diabetic neuropathy, without long-term current use of insulin (HCC) - POCT HgB A1C 5.7 today. Continue glimepiride as prescribed. Due to worsening neuropathy in both feet at night, will do trial of tramadol 50mg  at bedtime to help pain. Continue gabapentin 600mg  three times day as before  - glimepiride (AMARYL) 1 MG tablet; Take one tab po qd with lunch  Dispense: 30 tablet; Refill: 3 - traMADol (ULTRAM) 50 MG tablet; Take 1 tablet (50 mg total) by mouth at bedtime as needed for moderate pain.  Dispense: 30 tablet; Refill: 1  2. Essential  hypertension, benign Stable. Continue bp medication as prescribed   3. Primary osteoarthritis of both knees Continue regular visits with orthopedics as scheduled.     General Counseling: ubaldo daywalt understanding of the findings of todays visit and agrees with plan of treatment. I have discussed any further diagnostic evaluation that may be needed or ordered today. We also reviewed his medications today. he has been encouraged to call the office with any questions or concerns that should arise related to todays visit.  Diabetes Counseling:  1. Addition of ACE inh/ ARB'S for nephroprotection. Microalbumin is updated  2. Diabetic foot care, prevention of complications. Podiatry consult 3. Exercise and lose weight.  4. Diabetic eye examination, Diabetic eye exam is updated  5. Monitor blood sugar closlely. nutrition counseling.  6. Sign and symptoms of hypoglycemia including shaking sweating,confusion and headaches.  Reviewed risks and possible side effects associated with taking opiates, benzodiazepines and other CNS depressants. Combination of these could cause dizziness and drowsiness. Advised patient not to drive or operate machinery when taking these medications, as patient's and other's life can be at risk and will have consequences. Patient verbalized understanding in this matter. Dependence and abuse for these drugs will be monitored closely. A Controlled substance policy and procedure is on file which allows [facility] to order a urine drug screen test at any visit. Patient understands and agrees with the plan  This patient was seen by Leretha Pol FNP Collaboration with Dr [name] as a part of collaborative care agreement  Orders Placed This Encounter  Procedures  . POCT HgB A1C    Meds ordered this encounter  Medications  . glimepiride (AMARYL) 1 MG tablet  Sig: Take one tab po qd with lunch    Dispense:  30 tablet    Refill:  3    Order Specific  Question:   Supervising Provider    Answer:   [name] [place]  . traMADol (ULTRAM) 50 MG tablet    Sig: Take 1 tablet (50 mg total) by mouth at bedtime as needed for moderate pain.    Dispense:  30 tablet    Refill:  1    Tramadol to be taken at night only.    Order Specific Question:   Supervising Provider    Answer:   [name] [[id number]]    Time spent: 72 Minutes      Dr [name] Internal medicine

## 2018-03-28 ENCOUNTER — Ambulatory Visit (INDEPENDENT_AMBULATORY_CARE_PROVIDER_SITE_OTHER): Payer: PPO | Admitting: Nurse Practitioner

## 2018-03-28 ENCOUNTER — Encounter: Payer: Self-pay | Admitting: Nurse Practitioner

## 2018-03-28 VITALS — BP 137/71 | HR 83 | Resp 16 | Ht 69.0 in | Wt 176.0 lb

## 2018-03-28 DIAGNOSIS — I10 Essential (primary) hypertension: Secondary | ICD-10-CM | POA: Diagnosis not present

## 2018-03-28 DIAGNOSIS — K219 Gastro-esophageal reflux disease without esophagitis: Secondary | ICD-10-CM | POA: Diagnosis not present

## 2018-03-28 DIAGNOSIS — E1140 Type 2 diabetes mellitus with diabetic neuropathy, unspecified: Secondary | ICD-10-CM | POA: Diagnosis not present

## 2018-03-28 DIAGNOSIS — N400 Enlarged prostate without lower urinary tract symptoms: Secondary | ICD-10-CM | POA: Diagnosis not present

## 2018-03-28 MED ORDER — METOPROLOL TARTRATE 50 MG PO TABS: 50.0000 mg | ORAL_TABLET | Freq: Two times a day (BID) | ORAL | 6 refills | Status: DC

## 2018-03-28 MED ORDER — OMEPRAZOLE 20 MG PO CPDR: 20.0000 mg | DELAYED_RELEASE_CAPSULE | Freq: Every day | ORAL | 3 refills | Status: DC

## 2018-03-28 MED ORDER — GLIMEPIRIDE 1 MG PO TABS: ORAL_TABLET | ORAL | 3 refills | Status: DC

## 2018-03-28 MED ORDER — TADALAFIL 5 MG PO TABS: 5.0000 mg | ORAL_TABLET | Freq: Every day | ORAL | 5 refills | Status: DC

## 2018-03-28 MED ORDER — LISINOPRIL 20 MG PO TABS: ORAL_TABLET | ORAL | 6 refills | Status: DC

## 2018-03-28 MED ORDER — GABAPENTIN 800 MG PO TABS: 800.0000 mg | ORAL_TABLET | Freq: Three times a day (TID) | ORAL | 3 refills | Status: DC

## 2018-03-28 NOTE — Progress Notes (Signed)
[facility] [place], [place] [address]  Internal MEDICINE  Office Visit Note  Patient Name: Shawn Greene  [id number]  [id number]  Date of Service: 03/28/2018  Chief Complaint  Patient presents with  . Knee Pain    wants to maybe increase medication for neuropathy     The patient is here for routine follow up visit. Blood sugars are well controlled, with HgbA1c at 5.7 at last visit. States that neuropathy in his feet continues to be moderate to severe. Tried adding a dose of tramadol to take as needed to help with pain. Stayed with gabapentin dose at 600mg  three times daily. States that tramadol did not help much with the paint. Gave him constipation and made him sleepy. He has ultimately stopped taking this altogether. In the past, Lyrica made him feel dizzy and confused and tired. We did try switching to Horizant, and this just didn't help neuropathy at all.       Current Medication: Outpatient Encounter Medications as of 03/28/2018  Medication Sig  . aspirin EC 81 MG tablet Take 81 mg by mouth.  . Blood Glucose Monitoring Suppl (GLUCOCOM BLOOD GLUCOSE MONITOR) DEVI FREESTYLE LITE METER.  use as directed to check blood sugars  . gabapentin (NEURONTIN) 800 MG tablet Take 1 tablet (800 mg total) by mouth 3 (three) times daily.  Marland Kitchen gemfibrozil (LOPID) 600 MG tablet Take 1 tablet (600 mg total) by mouth 2 (two) times daily before a meal.  . glimepiride (AMARYL) 1 MG tablet Take one tab po qd with lunch  . glucose blood test strip 1 each by Other route daily. Use once a daily to check blood sugar (free style test strips )  . Lancets 28G MISC by Does not apply route daily.  Marland Kitchen lisinopril (PRINIVIL,ZESTRIL) 20 MG tablet Take one tab po qd  . metoprolol tartrate (LOPRESSOR) 50 MG tablet Take 1 tablet (50 mg total) by mouth 2 (two) times daily.  . tadalafil (CIALIS) 5 MG tablet Take 1 tablet (5 mg total) by mouth daily.  . [DISCONTINUED] gabapentin (NEURONTIN) 600 MG  tablet Take 1 tablet (600 mg total) by mouth 3 (three) times daily.  . [DISCONTINUED] glimepiride (AMARYL) 1 MG tablet Take one tab po qd with lunch  . [DISCONTINUED] lisinopril (PRINIVIL,ZESTRIL) 20 MG tablet Take one tab po qd  . [DISCONTINUED] metoprolol tartrate (LOPRESSOR) 50 MG tablet Take 1 tablet (50 mg total) by mouth 2 (two) times daily.  . [DISCONTINUED] tadalafil (CIALIS) 5 MG tablet Take 1 tablet (5 mg total) by mouth daily.  . [DISCONTINUED] traMADol (ULTRAM) 50 MG tablet Take 1 tablet (50 mg total) by mouth at bedtime as needed for moderate pain.  Marland Kitchen omeprazole (PRILOSEC) 20 MG capsule Take 1 capsule (20 mg total) by mouth daily.   No facility-administered encounter medications on file as of 03/28/2018.     Surgical History: Past Surgical History:  Procedure Laterality Date  . ANKLE SURGERY    . COLECTOMY  2012    Medical History: Past Medical History:  Diagnosis Date  . Chronic tension-type headache, not intractable   . Essential (primary) hypertension   . Mixed hyperlipidemia   . Psoriasis   . Type 2 diabetes, controlled, with neuropathy ([place])   . 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 Hypertension Mother     Social History   Socioeconomic History  . Marital status: Married  Spouse name: Not on file  . Number of children: Not on file  . Years of education: Not on file  . Highest education level: Not on file  Occupational History  . Not on file  Social Needs  . Financial resource strain: Not on file  . Food insecurity:    Worry: Not on file    Inability: Not on file  . Transportation needs:    Medical: Not on file    Non-medical: Not on file  Tobacco Use  . Smoking status: Never Smoker  . Smokeless tobacco: Never Used  Substance and Sexual Activity  . Alcohol use: Yes    Alcohol/week: 1.0 standard drinks    Types: 1 Cans of beer per week    Comment: has not had any in 3  months drinks rarely  . Drug use: No  . Sexual activity: Not on file  Lifestyle  . Physical activity:    Days per week: Not on file    Minutes per session: Not on file  . Stress: Not on file  Relationships  . Social connections:    Talks on phone: Not on file    Gets together: Not on file    Attends religious service: Not on file    Active member of club or organization: Not on file    Attends meetings of clubs or organizations: Not on file    Relationship status: Not on file  . Intimate partner violence:    Fear of current or ex partner: Not on file    Emotionally abused: Not on file    Physically abused: Not on file    Forced sexual activity: Not on file  Other Topics Concern  . Not on file  Social History Narrative  . Not on file      Review of Systems  Constitutional: Negative for activity change, chills, fatigue and unexpected weight change.  HENT: Negative for congestion, postnasal drip, rhinorrhea, sneezing and sore throat.   Respiratory: Negative for cough, chest tightness, shortness of breath and wheezing.   Cardiovascular: Negative for chest pain and palpitations.  Gastrointestinal: Positive for constipation. Negative for abdominal pain, diarrhea, nausea and vomiting.       Having reflux symptoms after he eats. Not having every time, but generally about once a day. Will take Alka Seltzer to help symptoms.   Endocrine: Negative for cold intolerance, heat intolerance, polydipsia and polyuria.       Blood sugars doing well   Musculoskeletal: Positive for arthralgias. Negative for back pain, joint swelling and neck pain.       Bilateral knee pain for which he sees orthopedics.   Skin: Negative for rash.  Allergic/Immunologic: Negative for environmental allergies.  Neurological: Positive for numbness. Negative for tremors.       Neuropathy in his feet which is worsening, especially at night.   Hematological: Negative for adenopathy. Does not bruise/bleed easily.   Psychiatric/Behavioral: Negative for behavioral problems (Depression), dysphoric mood, sleep disturbance and suicidal ideas. The patient is not nervous/anxious.     Today's Vitals   03/28/18 0839  BP: 137/71  Pulse: 83  Resp: 16  SpO2: 97%  Weight: 176 lb (79.8 kg)  Height: 5\' 9"  (1.753 m)   Body mass index is 25.99 kg/m.  Physical Exam Vitals signs and nursing note reviewed.  Constitutional:      General: He is not in acute distress.    Appearance: Normal appearance. He is well-developed. He is not diaphoretic.  HENT:  Head: Normocephalic and atraumatic.     Nose: Nose normal.     Mouth/Throat:     Pharynx: No oropharyngeal exudate.  Eyes:     Conjunctiva/sclera: Conjunctivae normal.     Pupils: Pupils are equal, round, and reactive to light.  Neck:     Musculoskeletal: Normal range of motion and neck supple.     Thyroid: No thyromegaly.     Vascular: No carotid bruit or JVD.     Trachea: No tracheal deviation.  Cardiovascular:     Rate and Rhythm: Normal rate and regular rhythm.     Heart sounds: Normal heart sounds.  Pulmonary:     Effort: Pulmonary effort is normal.     Breath sounds: Normal breath sounds. No wheezing or rales.  Abdominal:     General: Bowel sounds are normal.     Palpations: Abdomen is soft.     Tenderness: There is no abdominal tenderness.  Musculoskeletal: Normal range of motion.     Comments: Bilateral knee tenderness.   Lymphadenopathy:     Cervical: No cervical adenopathy.  Skin:    General: Skin is warm and dry.     Capillary Refill: Capillary refill takes 2 to 3 seconds.  Neurological:     General: No focal deficit present.     Mental Status: He is alert and oriented to person, place, and time.     Cranial Nerves: No cranial nerve deficit.  Psychiatric:        Behavior: Behavior normal.        Thought Content: Thought content normal.        Judgment: Judgment normal.    Assessment/Plan: 1. Type 2 diabetes mellitus with  diabetic neuropathy, without long-term current use of insulin (HCC) Increase gabapentin to 800mg  tid for neuropathy. Continue diabetic medication as prescribed . - gabapentin (NEURONTIN) 800 MG tablet; Take 1 tablet (800 mg total) by mouth 3 (three) times daily.  Dispense: 90 tablet; Refill: 3 - glimepiride (AMARYL) 1 MG tablet; Take one tab po qd with lunch  Dispense: 30 tablet; Refill: 3  2. Essential hypertension, benign Stable. Continue bp medication as prescribed . - lisinopril (PRINIVIL,ZESTRIL) 20 MG tablet; Take one tab po qd  Dispense: 30 tablet; Refill: 6 - metoprolol tartrate (LOPRESSOR) 50 MG tablet; Take 1 tablet (50 mg total) by mouth 2 (two) times daily.  Dispense: 60 tablet; Refill: 6  3. Benign prostatic hyperplasia without lower urinary tract symptoms May take cialis 5mg  daily.  - tadalafil (CIALIS) 5 MG tablet; Take 1 tablet (5 mg total) by mouth daily.  Dispense: 30 tablet; Refill: 5  4. Gastroesophageal reflux disease without esophagitis Start omeprazole 20mg  daily with supper as needed. Reassess at next visit.  - omeprazole (PRILOSEC) 20 MG capsule; Take 1 capsule (20 mg total) by mouth daily.  Dispense: 30 capsule; Refill: 3  General Counseling: Shawn Greene verbalizes understanding of the findings of todays visit and agrees with plan of treatment. I have discussed any further diagnostic evaluation that may be needed or ordered today. We also reviewed his medications today. he has been encouraged to call the office with any questions or concerns that should arise related to todays visit.  Diabetes Counseling:  1. Addition of ACE inh/ ARB'S for nephroprotection. Microalbumin is updated  2. Diabetic foot care, prevention of complications. Podiatry consult 3. Exercise and lose weight.  4. Diabetic eye examination, Diabetic eye exam is updated  5. Monitor blood sugar closlely. nutrition counseling.  6. Sign and symptoms  of hypoglycemia including shaking sweating,confusion and  headaches.   This patient was seen by [place] with Dr [name] as a part of collaborative care agreement  Meds ordered this encounter  Medications  . gabapentin (NEURONTIN) 800 MG tablet    Sig: Take 1 tablet (800 mg total) by mouth 3 (three) times daily.    Dispense:  90 tablet    Refill:  3    Please note increased dsoe    Order Specific Question:   Supervising Provider    Answer:   [name] [[id number]]  . glimepiride (AMARYL) 1 MG tablet    Sig: Take one tab po qd with lunch    Dispense:  30 tablet    Refill:  3    Order Specific Question:   Supervising Provider    Answer:   [name] [place]  . lisinopril (PRINIVIL,ZESTRIL) 20 MG tablet    Sig: Take one tab po qd    Dispense:  30 tablet    Refill:  6    Order Specific Question:   Supervising Provider    Answer:   [name] [[id number]]  . metoprolol tartrate (LOPRESSOR) 50 MG tablet    Sig: Take 1 tablet (50 mg total) by mouth 2 (two) times daily.    Dispense:  60 tablet    Refill:  6    Order Specific Question:   Supervising Provider    Answer:   [name] [[id number]]  . tadalafil (CIALIS) 5 MG tablet    Sig: Take 1 tablet (5 mg total) by mouth daily.    Dispense:  30 tablet    Refill:  5    Order Specific Question:   Supervising Provider    Answer:   [name] [[id number]]  . omeprazole (PRILOSEC) 20 MG capsule    Sig: Take 1 capsule (20 mg total) by mouth daily.    Dispense:  30 capsule    Refill:  3    Order Specific Question:   Supervising Provider    Answer:   [name] [[id number]]    Time spent: 51 Minutes      Dr [name] Internal medicine

## 2018-04-11 DIAGNOSIS — M170 Bilateral primary osteoarthritis of knee: Secondary | ICD-10-CM | POA: Diagnosis not present

## 2018-06-10 ENCOUNTER — Ambulatory Visit: Payer: Self-pay | Admitting: Adult Health

## 2018-06-19 DIAGNOSIS — E538 Deficiency of other specified B group vitamins: Secondary | ICD-10-CM | POA: Diagnosis not present

## 2018-06-19 DIAGNOSIS — M5481 Occipital neuralgia: Secondary | ICD-10-CM | POA: Diagnosis not present

## 2018-06-19 DIAGNOSIS — R7309 Other abnormal glucose: Secondary | ICD-10-CM | POA: Diagnosis not present

## 2018-06-19 DIAGNOSIS — E559 Vitamin D deficiency, unspecified: Secondary | ICD-10-CM | POA: Diagnosis not present

## 2018-06-19 DIAGNOSIS — R200 Anesthesia of skin: Secondary | ICD-10-CM | POA: Diagnosis not present

## 2018-07-05 ENCOUNTER — Ambulatory Visit (INDEPENDENT_AMBULATORY_CARE_PROVIDER_SITE_OTHER): Payer: PPO | Admitting: Nurse Practitioner

## 2018-07-05 ENCOUNTER — Encounter: Payer: Self-pay | Admitting: Nurse Practitioner

## 2018-07-05 ENCOUNTER — Other Ambulatory Visit: Payer: Self-pay

## 2018-07-05 VITALS — BP 144/88 | HR 73 | Resp 16 | Ht 69.0 in | Wt 179.0 lb

## 2018-07-05 DIAGNOSIS — R0609 Other forms of dyspnea: Secondary | ICD-10-CM | POA: Diagnosis not present

## 2018-07-05 DIAGNOSIS — R0600 Dyspnea, unspecified: Secondary | ICD-10-CM

## 2018-07-05 DIAGNOSIS — K219 Gastro-esophageal reflux disease without esophagitis: Secondary | ICD-10-CM

## 2018-07-05 DIAGNOSIS — E1140 Type 2 diabetes mellitus with diabetic neuropathy, unspecified: Secondary | ICD-10-CM | POA: Diagnosis not present

## 2018-07-05 DIAGNOSIS — I10 Essential (primary) hypertension: Secondary | ICD-10-CM | POA: Diagnosis not present

## 2018-07-05 LAB — POCT GLYCOSYLATED HEMOGLOBIN (HGB A1C): Hemoglobin A1C: 5.6 % (ref 4.0–5.6)

## 2018-07-05 MED ORDER — OMEPRAZOLE 20 MG PO CPDR: 20.0000 mg | DELAYED_RELEASE_CAPSULE | Freq: Every day | ORAL | 3 refills | Status: DC

## 2018-07-05 MED ORDER — GLIMEPIRIDE 1 MG PO TABS: ORAL_TABLET | ORAL | 3 refills | Status: DC

## 2018-07-05 MED ORDER — GABAPENTIN 800 MG PO TABS: 800.0000 mg | ORAL_TABLET | Freq: Three times a day (TID) | ORAL | 3 refills | Status: DC

## 2018-07-05 NOTE — Progress Notes (Signed)
[facility] [place], [place] [address]  Internal MEDICINE  Office Visit Note  Patient Name: Shawn Greene  [id number]  [id number]  Date of Service: 07/05/2018  Chief Complaint  Patient presents with  . Diabetes  . Hyperlipidemia  . Hypertension    The patient is here for routine follow up visit. Blood sugars are well controlled, with HgbA1c at 5.6 at this visit. States that neuropathy in his feet continues to be moderate to severe. Tried adding a dose of tramadol to take as needed to help with pain. Stayed with gabapentin dose at 800mg  three times daily. The patient states that this increase in dosing seems to be helping some. Has noted some intermittent swelling in his feet and ankles. Comes and goes. Cannot think of anything that triggers the swelling. States that swelling is not painful. He has very mild shortness of breath with exertion and denies chest pain. He has seen a neurologist since his most recent visit. He is currently taking cymbalta 20mg  one in the morning and one in the evening. He denies problems or negative side effects from this .      Current Medication: Outpatient Encounter Medications as of 07/05/2018  Medication Sig  . aspirin EC 81 MG tablet Take 81 mg by mouth.  . Blood Glucose Monitoring Suppl (GLUCOCOM BLOOD GLUCOSE MONITOR) DEVI FREESTYLE LITE METER.  use as directed to check blood sugars  . DULoxetine (CYMBALTA) 20 MG capsule Take 20 mg daily for 2 weeks then increase to 40 mg daily  . gabapentin (NEURONTIN) 800 MG tablet Take 1 tablet (800 mg total) by mouth 3 (three) times daily.  Marland Kitchen gemfibrozil (LOPID) 600 MG tablet Take 1 tablet (600 mg total) by mouth 2 (two) times daily before a meal.  . glimepiride (AMARYL) 1 MG tablet Take one tab po qd with lunch  . glucose blood test strip 1 each by Other route daily. Use once a daily to check blood sugar (free style test strips )  . Lancets 28G MISC by Does not apply route daily.  Marland Kitchen  lisinopril (PRINIVIL,ZESTRIL) 20 MG tablet Take one tab po qd  . metoprolol tartrate (LOPRESSOR) 50 MG tablet Take 1 tablet (50 mg total) by mouth 2 (two) times daily.  Marland Kitchen omeprazole (PRILOSEC) 20 MG capsule Take 1 capsule (20 mg total) by mouth daily.  . tadalafil (CIALIS) 5 MG tablet Take 1 tablet (5 mg total) by mouth daily.   No facility-administered encounter medications on file as of 07/05/2018.     Surgical History: Past Surgical History:  Procedure Laterality Date  . ANKLE SURGERY    . COLECTOMY  2012    Medical History: Past Medical History:  Diagnosis Date  . Chronic tension-type headache, not intractable   . Essential (primary) hypertension   . Mixed hyperlipidemia   . Psoriasis   . Type 2 diabetes, controlled, with neuropathy ([place])   . 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 Hypertension Mother     Social History   Socioeconomic History  . Marital status: Married    Spouse name: Not on file  . Number of children: Not on file  . Years of education: Not on file  . Highest education level: Not on file  Occupational History  . Not on file  Social Needs  . Financial resource strain: Not on file  . Food insecurity:    Worry:  Not on file    Inability: Not on file  . Transportation needs:    Medical: Not on file    Non-medical: Not on file  Tobacco Use  . Smoking status: Never Smoker  . Smokeless tobacco: Never Used  Substance and Sexual Activity  . Alcohol use: Yes    Alcohol/week: 1.0 standard drinks    Types: 1 Cans of beer per week    Comment: has not had any in 3 months drinks rarely  . Drug use: No  . Sexual activity: Not on file  Lifestyle  . Physical activity:    Days per week: Not on file    Minutes per session: Not on file  . Stress: Not on file  Relationships  . Social connections:    Talks on phone: Not on file    Gets together: Not on file    Attends religious  service: Not on file    Active member of club or organization: Not on file    Attends meetings of clubs or organizations: Not on file    Relationship status: Not on file  . Intimate partner violence:    Fear of current or ex partner: Not on file    Emotionally abused: Not on file    Physically abused: Not on file    Forced sexual activity: Not on file  Other Topics Concern  . Not on file  Social History Narrative  . Not on file      Review of Systems  Constitutional: Negative for activity change, chills, fatigue and unexpected weight change.  HENT: Negative for congestion, postnasal drip, rhinorrhea, sneezing and sore throat.   Respiratory: Negative for cough, chest tightness, shortness of breath and wheezing.   Cardiovascular: Positive for leg swelling. Negative for chest pain and palpitations.  Gastrointestinal: Negative for abdominal pain, constipation, diarrhea, nausea and vomiting.  Endocrine: Negative for cold intolerance, heat intolerance, polydipsia and polyuria.       Blood sugars doing well   Musculoskeletal: Positive for arthralgias. Negative for back pain, joint swelling and neck pain.       Bilateral knee pain for which he sees orthopedics.   Skin: Negative for rash.  Allergic/Immunologic: Negative for environmental allergies.  Neurological: Positive for numbness. Negative for tremors.       Neuropathy in his feet which is worsening, especially at night.   Hematological: Negative for adenopathy. Does not bruise/bleed easily.  Psychiatric/Behavioral: Negative for behavioral problems (Depression), dysphoric mood, sleep disturbance and suicidal ideas. The patient is not nervous/anxious.     Today's Vitals   07/05/18 0830  BP: (!) 144/88  Pulse: 73  Resp: 16  SpO2: 97%  Weight: 179 lb (81.2 kg)  Height: 5\' 9"  (1.753 m)   Body mass index is 26.43 kg/m.  Physical Exam Vitals signs and nursing note reviewed.  Constitutional:      General: He is not in acute  distress.    Appearance: Normal appearance. He is well-developed. He is not diaphoretic.  HENT:     Head: Normocephalic and atraumatic.     Mouth/Throat:     Pharynx: No oropharyngeal exudate.  Eyes:     Conjunctiva/sclera: Conjunctivae normal.     Pupils: Pupils are equal, round, and reactive to light.  Neck:     Musculoskeletal: Normal range of motion and neck supple.     Thyroid: No thyromegaly.     Vascular: No carotid bruit or JVD.     Trachea: No tracheal deviation.  Cardiovascular:  Rate and Rhythm: Normal rate and regular rhythm.     Heart sounds: Normal heart sounds.  Pulmonary:     Effort: Pulmonary effort is normal.     Breath sounds: Normal breath sounds. No wheezing or rales.  Abdominal:     General: Bowel sounds are normal.     Palpations: Abdomen is soft.     Tenderness: There is no abdominal tenderness.  Musculoskeletal: Normal range of motion.  Lymphadenopathy:     Cervical: No cervical adenopathy.  Skin:    General: Skin is warm and dry.     Capillary Refill: Capillary refill takes 2 to 3 seconds.  Neurological:     General: No focal deficit present.     Mental Status: He is alert and oriented to person, place, and time.     Cranial Nerves: No cranial nerve deficit.  Psychiatric:        Behavior: Behavior normal.        Thought Content: Thought content normal.        Judgment: Judgment normal.    Assessment/Plan:  1. Type 2 diabetes mellitus with diabetic neuropathy, without long-term current use of insulin (HCC) - POCT HgB A1C 5.6 today. Improved neuropathy with increased dose gabapentin. Continue TID. Started on duloxetine 20mg  bid and doing well. Continue as prescribed   2. Dyspnea on exertion Will get echocardiogram for further evaluation.  - ECHOCARDIOGRAM COMPLETE; Future  3. Essential hypertension, benign Stable. Continue bp medication as prescribed   General Counseling: narciso stoutenburg understanding of the findings of todays visit and  agrees with plan of treatment. I have discussed any further diagnostic evaluation that may be needed or ordered today. We also reviewed his medications today. he has been encouraged to call the office with any questions or concerns that should arise related to todays visit.  Diabetes Counseling:  1. Addition of ACE inh/ ARB'S for nephroprotection. Microalbumin is updated  2. Diabetic foot care, prevention of complications. Podiatry consult 3. Exercise and lose weight.  4. Diabetic eye examination, Diabetic eye exam is updated  5. Monitor blood sugar closlely. nutrition counseling.  6. Sign and symptoms of hypoglycemia including shaking sweating,confusion and headaches.   This patient was seen by Leretha Pol FNP Collaboration with Dr [name] as a part of collaborative care agreement  Orders Placed This Encounter  Procedures  . POCT HgB A1C  . ECHOCARDIOGRAM COMPLETE     Time spent: 25 Minutes      Dr [name] Internal medicine

## 2018-07-12 ENCOUNTER — Ambulatory Visit: Payer: PPO

## 2018-07-12 ENCOUNTER — Other Ambulatory Visit: Payer: Self-pay

## 2018-07-12 DIAGNOSIS — R0600 Dyspnea, unspecified: Secondary | ICD-10-CM

## 2018-07-12 DIAGNOSIS — R0609 Other forms of dyspnea: Secondary | ICD-10-CM

## 2018-07-22 ENCOUNTER — Other Ambulatory Visit: Payer: Self-pay

## 2018-07-22 DIAGNOSIS — E781 Pure hyperglyceridemia: Secondary | ICD-10-CM

## 2018-07-22 MED ORDER — GEMFIBROZIL 600 MG PO TABS: 600.0000 mg | ORAL_TABLET | Freq: Two times a day (BID) | ORAL | 6 refills | Status: DC

## 2018-07-25 DIAGNOSIS — H25813 Combined forms of age-related cataract, bilateral: Secondary | ICD-10-CM | POA: Diagnosis not present

## 2018-08-02 ENCOUNTER — Other Ambulatory Visit: Payer: Self-pay

## 2018-08-02 ENCOUNTER — Ambulatory Visit (INDEPENDENT_AMBULATORY_CARE_PROVIDER_SITE_OTHER): Payer: PPO | Admitting: Nurse Practitioner

## 2018-08-02 ENCOUNTER — Encounter: Payer: Self-pay | Admitting: Nurse Practitioner

## 2018-08-02 VITALS — BP 132/64 | HR 78 | Resp 16 | Ht 69.0 in | Wt 182.6 lb

## 2018-08-02 DIAGNOSIS — R0609 Other forms of dyspnea: Secondary | ICD-10-CM

## 2018-08-02 DIAGNOSIS — I10 Essential (primary) hypertension: Secondary | ICD-10-CM

## 2018-08-02 DIAGNOSIS — E1140 Type 2 diabetes mellitus with diabetic neuropathy, unspecified: Secondary | ICD-10-CM | POA: Diagnosis not present

## 2018-08-02 DIAGNOSIS — R0600 Dyspnea, unspecified: Secondary | ICD-10-CM

## 2018-08-02 NOTE — Progress Notes (Signed)
[facility] [place], [place] [address]  Internal MEDICINE  Office Visit Note  Patient Name: Shawn Greene  [id number]  [id number]  Date of Service: 08/02/2018  Chief Complaint  Patient presents with  . Medical Management of Chronic Issues    Follow up  . Labs Only    Echo results    Patient had echocardiogram since his last visit. Neurologist wanted to make sure there was no cardiac contribution to neuropathy and swelling in the feet and lower legs. Echo shows normal size and function of the left ventricle. There is mild aortic sclerosis and trace to mild tricuspid regurgitation. Patient states that he feels well. Still taking gabapentin and duloxetine as prescribed. States that he is doing well with these medication.       Current Medication: Outpatient Encounter Medications as of 08/02/2018  Medication Sig  . aspirin EC 81 MG tablet Take 81 mg by mouth.  . Blood Glucose Monitoring Suppl (GLUCOCOM BLOOD GLUCOSE MONITOR) DEVI FREESTYLE LITE METER.  use as directed to check blood sugars  . DULoxetine (CYMBALTA) 20 MG capsule Take 20 mg daily for 2 weeks then increase to 40 mg daily  . gabapentin (NEURONTIN) 800 MG tablet Take 1 tablet (800 mg total) by mouth 3 (three) times daily.  Marland Kitchen gemfibrozil (LOPID) 600 MG tablet Take 1 tablet (600 mg total) by mouth 2 (two) times daily before a meal.  . glimepiride (AMARYL) 1 MG tablet Take one tab po qd with lunch  . glucose blood test strip 1 each by Other route daily. Use once a daily to check blood sugar (free style test strips )  . Lancets 28G MISC by Does not apply route daily.  Marland Kitchen lisinopril (PRINIVIL,ZESTRIL) 20 MG tablet Take one tab po qd  . metoprolol tartrate (LOPRESSOR) 50 MG tablet Take 1 tablet (50 mg total) by mouth 2 (two) times daily.  Marland Kitchen omeprazole (PRILOSEC) 20 MG capsule Take 1 capsule (20 mg total) by mouth daily.  . tadalafil (CIALIS) 5 MG tablet Take 1 tablet (5 mg total) by mouth daily.   No  facility-administered encounter medications on file as of 08/02/2018.     Surgical History: Past Surgical History:  Procedure Laterality Date  . ANKLE SURGERY    . COLECTOMY  2012    Medical History: Past Medical History:  Diagnosis Date  . Chronic tension-type headache, not intractable   . Essential (primary) hypertension   . Mixed hyperlipidemia   . Psoriasis   . Type 2 diabetes, controlled, with neuropathy ([place])   . 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 Hypertension Mother     Social History   Socioeconomic History  . Marital status: Married    Spouse name: Not on file  . Number of children: Not on file  . Years of education: Not on file  . Highest education level: Not on file  Occupational History  . Not on file  Social Needs  . Financial resource strain: Not on file  . Food insecurity    Worry: Not on file    Inability: Not on file  . Transportation needs    Medical: Not on file    Non-medical: Not on file  Tobacco Use  . Smoking status: Never Smoker  . Smokeless tobacco: Never Used  Substance and Sexual Activity  . Alcohol use: Yes    Alcohol/week: 1.0 standard drinks  Types: 1 Cans of beer per week    Comment: has not had any in 3 months drinks rarely  . Drug use: No  . Sexual activity: Not on file  Lifestyle  . Physical activity    Days per week: Not on file    Minutes per session: Not on file  . Stress: Not on file  Relationships  . Social Herbalist on phone: Not on file    Gets together: Not on file    Attends religious service: Not on file    Active member of club or organization: Not on file    Attends meetings of clubs or organizations: Not on file    Relationship status: Not on file  . Intimate partner violence    Fear of current or ex partner: Not on file    Emotionally abused: Not on file    Physically abused: Not on file    Forced sexual  activity: Not on file  Other Topics Concern  . Not on file  Social History Narrative  . Not on file      Review of Systems  Constitutional: Negative for activity change, chills, fatigue and unexpected weight change.  HENT: Negative for congestion, postnasal drip, rhinorrhea, sneezing and sore throat.   Respiratory: Negative for cough, chest tightness, shortness of breath and wheezing.   Cardiovascular: Positive for leg swelling. Negative for chest pain and palpitations.  Gastrointestinal: Negative for abdominal pain, constipation, diarrhea, nausea and vomiting.  Endocrine: Negative for cold intolerance, heat intolerance, polydipsia and polyuria.  Musculoskeletal: Negative for arthralgias, back pain, joint swelling and neck pain.  Skin: Negative for rash.  Allergic/Immunologic: Negative for environmental allergies.  Neurological: Positive for numbness. Negative for tremors.       Neuropathy in his feet which is worsening, especially at night.   Hematological: Negative for adenopathy. Does not bruise/bleed easily.  Psychiatric/Behavioral: Negative for behavioral problems (Depression), dysphoric mood, sleep disturbance and suicidal ideas. The patient is not nervous/anxious.    Today's Vitals   08/02/18 0902  BP: 132/64  Pulse: 78  Resp: 16  SpO2: 93%  Weight: 182 lb 9.6 oz (82.8 kg)  Height: 5\' 9"  (1.753 m)   Body mass index is 26.97 kg/m.  Physical Exam Vitals signs and nursing note reviewed.  Constitutional:      General: He is not in acute distress.    Appearance: Normal appearance. He is well-developed. He is not diaphoretic.  HENT:     Head: Normocephalic and atraumatic.     Mouth/Throat:     Pharynx: No oropharyngeal exudate.  Eyes:     Conjunctiva/sclera: Conjunctivae normal.     Pupils: Pupils are equal, round, and reactive to light.  Neck:     Musculoskeletal: Normal range of motion and neck supple.     Thyroid: No thyromegaly.     Vascular: No carotid bruit  or JVD.     Trachea: No tracheal deviation.  Cardiovascular:     Rate and Rhythm: Normal rate and regular rhythm.     Heart sounds: Normal heart sounds.  Pulmonary:     Effort: Pulmonary effort is normal.     Breath sounds: Normal breath sounds. No wheezing or rales.  Abdominal:     General: Bowel sounds are normal.     Palpations: Abdomen is soft.     Tenderness: There is no abdominal tenderness.  Musculoskeletal: Normal range of motion.  Lymphadenopathy:     Cervical: No cervical adenopathy.  Skin:    General: Skin is warm and dry.     Capillary Refill: Capillary refill takes 2 to 3 seconds.  Neurological:     General: No focal deficit present.     Mental Status: He is alert and oriented to person, place, and time.     Cranial Nerves: No cranial nerve deficit.  Psychiatric:        Behavior: Behavior normal.        Thought Content: Thought content normal.        Judgment: Judgment normal.    Assessment/Plan: 1. Dyspnea on exertion Reviewed results of echocardiogram with patient and his wife. Normal size and function of left ventricle. Mild aortic sclerosis and trace to mild tricuspid regurgitation. Will follow up with repeat study in one year.   2. Essential hypertension, benign Stable.   3. Type 2 diabetes mellitus with diabetic neuropathy, without long-term current use of insulin (HCC) Stable. Continue diabetic medication as well as gabapentin and cymbalta as prescribed   General Counseling: meet weathington understanding of the findings of todays visit and agrees with plan of treatment. I have discussed any further diagnostic evaluation that may be needed or ordered today. We also reviewed his medications today. he has been encouraged to call the office with any questions or concerns that should arise related to todays visit.  Diabetes Counseling:  1. Addition of ACE inh/ ARB'S for nephroprotection. Microalbumin is updated  2. Diabetic foot care, prevention of  complications. Podiatry consult 3. Exercise and lose weight.  4. Diabetic eye examination, Diabetic eye exam is updated  5. Monitor blood sugar closlely. nutrition counseling.  6. Sign and symptoms of hypoglycemia including shaking sweating,confusion and headaches.   This patient was seen by Leretha Pol FNP Collaboration with Dr [name] as a part of collaborative care agreement  Time spent: 68 Minutes      Dr [name] Internal medicine

## 2018-09-02 DIAGNOSIS — M170 Bilateral primary osteoarthritis of knee: Secondary | ICD-10-CM | POA: Diagnosis not present

## 2018-09-26 DIAGNOSIS — L814 Other melanin hyperpigmentation: Secondary | ICD-10-CM | POA: Diagnosis not present

## 2018-09-26 DIAGNOSIS — L820 Inflamed seborrheic keratosis: Secondary | ICD-10-CM | POA: Diagnosis not present

## 2018-09-26 DIAGNOSIS — L570 Actinic keratosis: Secondary | ICD-10-CM | POA: Diagnosis not present

## 2018-09-26 DIAGNOSIS — L821 Other seborrheic keratosis: Secondary | ICD-10-CM | POA: Diagnosis not present

## 2018-09-26 DIAGNOSIS — D229 Melanocytic nevi, unspecified: Secondary | ICD-10-CM | POA: Diagnosis not present

## 2018-10-01 DIAGNOSIS — M170 Bilateral primary osteoarthritis of knee: Secondary | ICD-10-CM | POA: Diagnosis not present

## 2018-10-15 ENCOUNTER — Encounter: Payer: Self-pay | Admitting: Adult Health

## 2018-10-15 ENCOUNTER — Ambulatory Visit (INDEPENDENT_AMBULATORY_CARE_PROVIDER_SITE_OTHER): Payer: PPO | Admitting: Adult Health

## 2018-10-15 ENCOUNTER — Other Ambulatory Visit: Payer: Self-pay

## 2018-10-15 VITALS — BP 118/70 | HR 78 | Resp 16 | Ht 69.0 in | Wt 180.0 lb

## 2018-10-15 DIAGNOSIS — M25562 Pain in left knee: Secondary | ICD-10-CM | POA: Diagnosis not present

## 2018-10-15 DIAGNOSIS — Z01818 Encounter for other preprocedural examination: Secondary | ICD-10-CM

## 2018-10-15 DIAGNOSIS — I700 Atherosclerosis of aorta: Secondary | ICD-10-CM

## 2018-10-15 DIAGNOSIS — M170 Bilateral primary osteoarthritis of knee: Secondary | ICD-10-CM | POA: Diagnosis not present

## 2018-10-15 DIAGNOSIS — M179 Osteoarthritis of knee, unspecified: Secondary | ICD-10-CM | POA: Diagnosis not present

## 2018-10-15 DIAGNOSIS — E1165 Type 2 diabetes mellitus with hyperglycemia: Secondary | ICD-10-CM | POA: Diagnosis not present

## 2018-10-15 DIAGNOSIS — M25662 Stiffness of left knee, not elsewhere classified: Secondary | ICD-10-CM | POA: Diagnosis not present

## 2018-10-15 LAB — POCT GLYCOSYLATED HEMOGLOBIN (HGB A1C): Hemoglobin A1C: 6.3 % — AB (ref 4.0–5.6)

## 2018-10-15 NOTE — Progress Notes (Signed)
[facility] [place], [place] [address]  Internal MEDICINE  Office Visit Note  Patient Name: Shawn Greene  [id number]  [id number]  Date of Service: 10/15/2018  Chief Complaint  Patient presents with  . Medical Clearance    surgical clearance for left knee replacement     HPI  Pt is here for surgical clearance.  He has a history of osteoarthritis and has been dealing with pain for a long time. He needs labs and an ekg for surgical clearance, which will be ordered today.      Current Medication: Outpatient Encounter Medications as of 10/15/2018  Medication Sig  . aspirin EC 81 MG tablet Take 81 mg by mouth.  . Blood Glucose Monitoring Suppl (GLUCOCOM BLOOD GLUCOSE MONITOR) DEVI FREESTYLE LITE METER.  use as directed to check blood sugars  . DULoxetine (CYMBALTA) 20 MG capsule Take 20 mg daily for 2 weeks then increase to 40 mg daily  . gabapentin (NEURONTIN) 800 MG tablet Take 1 tablet (800 mg total) by mouth 3 (three) times daily.  Marland Kitchen gemfibrozil (LOPID) 600 MG tablet Take 1 tablet (600 mg total) by mouth 2 (two) times daily before a meal.  . glimepiride (AMARYL) 1 MG tablet Take one tab po qd with lunch  . glucose blood test strip 1 each by Other route daily. Use once a daily to check blood sugar (free style test strips )  . Lancets 28G MISC by Does not apply route daily.  Marland Kitchen lisinopril (PRINIVIL,ZESTRIL) 20 MG tablet Take one tab po qd  . metoprolol tartrate (LOPRESSOR) 50 MG tablet Take 1 tablet (50 mg total) by mouth 2 (two) times daily.  Marland Kitchen omeprazole (PRILOSEC) 20 MG capsule Take 1 capsule (20 mg total) by mouth daily.  . tadalafil (CIALIS) 5 MG tablet Take 1 tablet (5 mg total) by mouth daily.   No facility-administered encounter medications on file as of 10/15/2018.     Surgical History: Past Surgical History:  Procedure Laterality Date  . ANKLE SURGERY    . COLECTOMY  2012    Medical History: Past Medical History:  Diagnosis Date  . Chronic  tension-type headache, not intractable   . Essential (primary) hypertension   . Mixed hyperlipidemia   . Psoriasis   . Type 2 diabetes, controlled, with neuropathy ([place])   . 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 Hypertension Mother     Social History   Socioeconomic History  . Marital status: Married    Spouse name: Not on file  . Number of children: Not on file  . Years of education: Not on file  . Highest education level: Not on file  Occupational History  . Not on file  Social Needs  . Financial resource strain: Not on file  . Food insecurity    Worry: Not on file    Inability: Not on file  . Transportation needs    Medical: Not on file    Non-medical: Not on file  Tobacco Use  . Smoking status: Never Smoker  . Smokeless tobacco: Never Used  Substance and Sexual Activity  . Alcohol use: Yes    Alcohol/week: 1.0 standard drinks    Types: 1 Cans of beer per week    Comment: has not had any in 3 months drinks rarely  . Drug use: No  . Sexual activity: Not on file  Lifestyle  . Physical  activity    Days per week: Not on file    Minutes per session: Not on file  . Stress: Not on file  Relationships  . Social Herbalist on phone: Not on file    Gets together: Not on file    Attends religious service: Not on file    Active member of club or organization: Not on file    Attends meetings of clubs or organizations: Not on file    Relationship status: Not on file  . Intimate partner violence    Fear of current or ex partner: Not on file    Emotionally abused: Not on file    Physically abused: Not on file    Forced sexual activity: Not on file  Other Topics Concern  . Not on file  Social History Narrative  . Not on file      Review of Systems  Constitutional: Negative.  Negative for chills, fatigue and unexpected weight change.  HENT: Negative.  Negative for congestion,  rhinorrhea, sneezing and sore throat.   Eyes: Negative for redness.  Respiratory: Negative.  Negative for cough, chest tightness and shortness of breath.   Cardiovascular: Negative.  Negative for chest pain and palpitations.  Gastrointestinal: Negative.  Negative for abdominal pain, constipation, diarrhea, nausea and vomiting.  Endocrine: Negative.   Genitourinary: Negative.  Negative for dysuria and frequency.  Musculoskeletal: Negative.  Negative for arthralgias, back pain, joint swelling and neck pain.  Skin: Negative.  Negative for rash.  Allergic/Immunologic: Negative.   Neurological: Negative.  Negative for tremors and numbness.  Hematological: Negative for adenopathy. Does not bruise/bleed easily.  Psychiatric/Behavioral: Negative.  Negative for behavioral problems, sleep disturbance and suicidal ideas. The patient is not nervous/anxious.     Vital Signs: BP 118/70 (BP Location: Left Arm, Patient Position: Sitting, Cuff Size: Normal)   Pulse 78   Resp 16   Ht 5\' 9"  (1.753 m)   Wt 180 lb (81.6 kg)   SpO2 97%   BMI 26.58 kg/m    Physical Exam Vitals signs and nursing note reviewed.  Constitutional:      General: He is not in acute distress.    Appearance: He is well-developed. He is not diaphoretic.  HENT:     Head: Normocephalic and atraumatic.     Mouth/Throat:     Pharynx: No oropharyngeal exudate.  Eyes:     Pupils: Pupils are equal, round, and reactive to light.  Neck:     Musculoskeletal: Normal range of motion and neck supple.     Thyroid: No thyromegaly.     Vascular: No JVD.     Trachea: No tracheal deviation.  Cardiovascular:     Rate and Rhythm: Normal rate and regular rhythm.     Heart sounds: Normal heart sounds. No murmur. No friction rub. No gallop.   Pulmonary:     Effort: Pulmonary effort is normal. No respiratory distress.     Breath sounds: Normal breath sounds. No wheezing or rales.  Chest:     Chest wall: No tenderness.  Abdominal:      Palpations: Abdomen is soft.     Tenderness: There is no abdominal tenderness. There is no guarding.  Musculoskeletal: Normal range of motion.  Lymphadenopathy:     Cervical: No cervical adenopathy.  Skin:    General: Skin is warm and dry.  Neurological:     Mental Status: He is alert and oriented to person, place, and time.  Cranial Nerves: No cranial nerve deficit.  Psychiatric:        Behavior: Behavior normal.        Thought Content: Thought content normal.        Judgment: Judgment normal.        Assessment/Plan: 1. Uncontrolled type 2 diabetes mellitus with hyperglycemia (HCC) A1C 6.3 today.  - POCT HgB A1C  2. Encounter for preoperative examination for general surgical procedure Labs ordered will review when available.  - EKG 12-Lead - CBC with Differential/Platelet - Lipid Panel With LDL/HDL Ratio - TSH - T4, free - Comprehensive metabolic panel  3. Atherosclerosis of aorta ([place]) - US Carotid Bilateral; Future  General Counseling: kedron nor understanding of the findings of todays visit and agrees with plan of treatment. I have discussed any further diagnostic evaluation that may be needed or ordered today. We also reviewed his medications today. he has been encouraged to call the office with any questions or concerns that should arise related to todays visit.    Orders Placed This Encounter  Procedures  . POCT HgB A1C  . EKG 12-Lead    No orders of the defined types were placed in this encounter.   Time spent: 20  Minutes   This patient was seen by Orson Gear AGNP-C in Collaboration with Dr [name] as a part of collaborative care agreement     Kendell Bane AGNP-C Internal medicine

## 2018-10-16 LAB — BASIC METABOLIC PANEL
BUN/Creatinine Ratio: 23 (ref 10–24)
BUN: 29 mg/dL — ABNORMAL HIGH (ref 8–27)
CO2: 20 mmol/L (ref 20–29)
Calcium: 10 mg/dL (ref 8.6–10.2)
Chloride: 103 mmol/L (ref 96–106)
Creatinine, Ser: 1.26 mg/dL (ref 0.76–1.27)
GFR calc Af Amer: 67 mL/min/{1.73_m2} (ref >59)
GFR calc non Af Amer: 58 mL/min/{1.73_m2} — ABNORMAL LOW (ref >59)
Glucose: 186 mg/dL — ABNORMAL HIGH (ref 65–99)
Potassium: 5.4 mmol/L — ABNORMAL HIGH (ref 3.5–5.2)
Sodium: 141 mmol/L (ref 134–144)

## 2018-10-16 LAB — CBC WITH DIFFERENTIAL/PLATELET
Basophils Absolute: 0.1 10*3/uL (ref 0.0–0.2)
Basos: 1 %
EOS (ABSOLUTE): 0.2 10*3/uL (ref 0.0–0.4)
Eos: 3 %
Hematocrit: 40.8 % (ref 37.5–51.0)
Hemoglobin: 14.4 g/dL (ref 13.0–17.7)
Immature Grans (Abs): 0.1 10*3/uL (ref 0.0–0.1)
Immature Granulocytes: 1 %
Lymphocytes Absolute: 2.5 10*3/uL (ref 0.7–3.1)
Lymphs: 32 %
MCH: 30.2 pg (ref 26.6–33.0)
MCHC: 35.3 g/dL (ref 31.5–35.7)
MCV: 86 fL (ref 79–97)
Monocytes Absolute: 1 10*3/uL — ABNORMAL HIGH (ref 0.1–0.9)
Monocytes: 13 %
Neutrophils Absolute: 3.9 10*3/uL (ref 1.4–7.0)
Neutrophils: 50 %
Platelets: 345 10*3/uL (ref 150–450)
RBC: 4.77 x10E6/uL (ref 4.14–5.80)
RDW: 12 % (ref 11.6–15.4)
WBC: 7.8 10*3/uL (ref 3.4–10.8)

## 2018-10-21 ENCOUNTER — Telehealth: Payer: Self-pay

## 2018-10-21 NOTE — Telephone Encounter (Signed)
FAXED SIGNED [facility].

## 2018-10-25 DIAGNOSIS — E119 Type 2 diabetes mellitus without complications: Secondary | ICD-10-CM | POA: Diagnosis not present

## 2018-10-25 DIAGNOSIS — I10 Essential (primary) hypertension: Secondary | ICD-10-CM | POA: Diagnosis not present

## 2018-10-25 DIAGNOSIS — M1712 Unilateral primary osteoarthritis, left knee: Secondary | ICD-10-CM | POA: Diagnosis not present

## 2018-10-25 DIAGNOSIS — Z79899 Other long term (current) drug therapy: Secondary | ICD-10-CM | POA: Diagnosis not present

## 2018-10-25 DIAGNOSIS — Z7984 Long term (current) use of oral hypoglycemic drugs: Secondary | ICD-10-CM | POA: Diagnosis not present

## 2018-10-25 DIAGNOSIS — E785 Hyperlipidemia, unspecified: Secondary | ICD-10-CM | POA: Diagnosis not present

## 2018-10-25 DIAGNOSIS — Z03818 Encounter for observation for suspected exposure to other biological agents ruled out: Secondary | ICD-10-CM | POA: Diagnosis not present

## 2018-10-25 DIAGNOSIS — Z9049 Acquired absence of other specified parts of digestive tract: Secondary | ICD-10-CM | POA: Diagnosis not present

## 2018-10-25 DIAGNOSIS — M25562 Pain in left knee: Secondary | ICD-10-CM | POA: Diagnosis not present

## 2018-10-25 DIAGNOSIS — F39 Unspecified mood [affective] disorder: Secondary | ICD-10-CM | POA: Diagnosis not present

## 2018-10-25 DIAGNOSIS — G8918 Other acute postprocedural pain: Secondary | ICD-10-CM | POA: Diagnosis not present

## 2018-10-25 DIAGNOSIS — Z7982 Long term (current) use of aspirin: Secondary | ICD-10-CM | POA: Diagnosis not present

## 2018-10-28 ENCOUNTER — Emergency Department: Payer: PPO

## 2018-10-28 ENCOUNTER — Inpatient Hospital Stay
Admission: EM | Admit: 2018-10-28 | Discharge: 2018-10-30 | DRG: 948 | Disposition: A | Discharge status: Home / Self Care | Payer: PPO | Attending: Internal Medicine | Admitting: Internal Medicine

## 2018-10-28 ENCOUNTER — Other Ambulatory Visit: Payer: Self-pay

## 2018-10-28 DIAGNOSIS — Z79899 Other long term (current) drug therapy: Secondary | ICD-10-CM

## 2018-10-28 DIAGNOSIS — E1140 Type 2 diabetes mellitus with diabetic neuropathy, unspecified: Secondary | ICD-10-CM | POA: Diagnosis present

## 2018-10-28 DIAGNOSIS — Z8249 Family history of ischemic heart disease and other diseases of the circulatory system: Secondary | ICD-10-CM | POA: Diagnosis not present

## 2018-10-28 DIAGNOSIS — R350 Frequency of micturition: Secondary | ICD-10-CM | POA: Diagnosis present

## 2018-10-28 DIAGNOSIS — G8918 Other acute postprocedural pain: Principal | ICD-10-CM | POA: Diagnosis present

## 2018-10-28 DIAGNOSIS — Z7982 Long term (current) use of aspirin: Secondary | ICD-10-CM

## 2018-10-28 DIAGNOSIS — M7989 Other specified soft tissue disorders: Secondary | ICD-10-CM | POA: Diagnosis not present

## 2018-10-28 DIAGNOSIS — T819XXA Unspecified complication of procedure, initial encounter: Secondary | ICD-10-CM | POA: Diagnosis not present

## 2018-10-28 DIAGNOSIS — Z20828 Contact with and (suspected) exposure to other viral communicable diseases: Secondary | ICD-10-CM | POA: Diagnosis not present

## 2018-10-28 DIAGNOSIS — K219 Gastro-esophageal reflux disease without esophagitis: Secondary | ICD-10-CM | POA: Diagnosis present

## 2018-10-28 DIAGNOSIS — L0390 Cellulitis, unspecified: Secondary | ICD-10-CM | POA: Diagnosis not present

## 2018-10-28 DIAGNOSIS — N401 Enlarged prostate with lower urinary tract symptoms: Secondary | ICD-10-CM | POA: Diagnosis present

## 2018-10-28 DIAGNOSIS — M47816 Spondylosis without myelopathy or radiculopathy, lumbar region: Secondary | ICD-10-CM | POA: Diagnosis not present

## 2018-10-28 DIAGNOSIS — R509 Fever, unspecified: Secondary | ICD-10-CM | POA: Diagnosis not present

## 2018-10-28 DIAGNOSIS — E119 Type 2 diabetes mellitus without complications: Secondary | ICD-10-CM | POA: Diagnosis not present

## 2018-10-28 DIAGNOSIS — M48061 Spinal stenosis, lumbar region without neurogenic claudication: Secondary | ICD-10-CM | POA: Diagnosis not present

## 2018-10-28 DIAGNOSIS — M79605 Pain in left leg: Secondary | ICD-10-CM | POA: Diagnosis not present

## 2018-10-28 DIAGNOSIS — M2578 Osteophyte, vertebrae: Secondary | ICD-10-CM | POA: Diagnosis not present

## 2018-10-28 DIAGNOSIS — E785 Hyperlipidemia, unspecified: Secondary | ICD-10-CM | POA: Diagnosis present

## 2018-10-28 DIAGNOSIS — N400 Enlarged prostate without lower urinary tract symptoms: Secondary | ICD-10-CM | POA: Diagnosis not present

## 2018-10-28 DIAGNOSIS — R319 Hematuria, unspecified: Secondary | ICD-10-CM | POA: Diagnosis not present

## 2018-10-28 DIAGNOSIS — Z7901 Long term (current) use of anticoagulants: Secondary | ICD-10-CM

## 2018-10-28 DIAGNOSIS — E782 Mixed hyperlipidemia: Secondary | ICD-10-CM | POA: Diagnosis present

## 2018-10-28 DIAGNOSIS — A419 Sepsis, unspecified organism: Secondary | ICD-10-CM | POA: Diagnosis present

## 2018-10-28 DIAGNOSIS — Z7984 Long term (current) use of oral hypoglycemic drugs: Secondary | ICD-10-CM

## 2018-10-28 DIAGNOSIS — I10 Essential (primary) hypertension: Secondary | ICD-10-CM | POA: Diagnosis not present

## 2018-10-28 DIAGNOSIS — L03116 Cellulitis of left lower limb: Secondary | ICD-10-CM | POA: Diagnosis not present

## 2018-10-28 DIAGNOSIS — M5126 Other intervertebral disc displacement, lumbar region: Secondary | ICD-10-CM | POA: Diagnosis not present

## 2018-10-28 DIAGNOSIS — K760 Fatty (change of) liver, not elsewhere classified: Secondary | ICD-10-CM | POA: Diagnosis not present

## 2018-10-28 LAB — URINALYSIS, COMPLETE (UACMP) WITH MICROSCOPIC
Bacteria, UA: NONE SEEN
Bilirubin Urine: NEGATIVE
Glucose, UA: NEGATIVE mg/dL
Hgb urine dipstick: NEGATIVE
Ketones, ur: NEGATIVE mg/dL
Leukocytes,Ua: NEGATIVE
Nitrite: NEGATIVE
Protein, ur: NEGATIVE mg/dL
Specific Gravity, Urine: 1.008 (ref 1.005–1.030)
Squamous Epithelial / HPF: NONE SEEN (ref 0–5)
pH: 6 (ref 5.0–8.0)

## 2018-10-28 LAB — CBC
HCT: 34 % — ABNORMAL LOW (ref 39.0–52.0)
Hemoglobin: 11.5 g/dL — ABNORMAL LOW (ref 13.0–17.0)
MCH: 30.3 pg (ref 26.0–34.0)
MCHC: 33.8 g/dL (ref 30.0–36.0)
MCV: 89.5 fL (ref 80.0–100.0)
Platelets: 275 10*3/uL (ref 150–400)
RBC: 3.8 MIL/uL — ABNORMAL LOW (ref 4.22–5.81)
RDW: 13 % (ref 11.5–15.5)
WBC: 13.4 10*3/uL — ABNORMAL HIGH (ref 4.0–10.5)
nRBC: 0 % (ref 0.0–0.2)

## 2018-10-28 LAB — COMPREHENSIVE METABOLIC PANEL
ALT: 28 U/L (ref 0–44)
AST: 34 U/L (ref 15–41)
Albumin: 3.4 g/dL — ABNORMAL LOW (ref 3.5–5.0)
Alkaline Phosphatase: 47 U/L (ref 38–126)
Anion gap: 14 (ref 5–15)
BUN: 21 mg/dL (ref 8–23)
CO2: 21 mmol/L — ABNORMAL LOW (ref 22–32)
Calcium: 8.9 mg/dL (ref 8.9–10.3)
Chloride: 101 mmol/L (ref 98–111)
Creatinine, Ser: 1.13 mg/dL (ref 0.61–1.24)
GFR calc Af Amer: >60 mL/min (ref >60)
GFR calc non Af Amer: >60 mL/min (ref >60)
Glucose, Bld: 148 mg/dL — ABNORMAL HIGH (ref 70–99)
Potassium: 3.3 mmol/L — ABNORMAL LOW (ref 3.5–5.1)
Sodium: 136 mmol/L (ref 135–145)
Total Bilirubin: 1.3 mg/dL — ABNORMAL HIGH (ref 0.3–1.2)
Total Protein: 7 g/dL (ref 6.5–8.1)

## 2018-10-28 LAB — LACTIC ACID, PLASMA: Lactic Acid, Venous: 2.2 mmol/L (ref 0.5–1.9)

## 2018-10-28 MED ORDER — MORPHINE SULFATE (PF) 4 MG/ML IV SOLN
INTRAVENOUS | Status: AC
  Administered 2018-10-28: 22:00:00 4 mg via INTRAVENOUS
  Filled 2018-10-28: qty 1

## 2018-10-28 MED ORDER — MORPHINE SULFATE (PF) 4 MG/ML IV SOLN
4.0000 mg | Freq: Once | INTRAVENOUS | Status: AC
  Administered 2018-10-28: 22:00:00 4 mg via INTRAVENOUS

## 2018-10-28 MED ORDER — GADOBUTROL 1 MMOL/ML IV SOLN
8.0000 mL | Freq: Once | INTRAVENOUS | Status: AC | PRN
  Administered 2018-10-29: 8 mL via INTRAVENOUS

## 2018-10-28 NOTE — ED Triage Notes (Addendum)
Pt arrived via POV with reports of left knee swelling and redness since Saturday afternoon as well as urinary frequency since Saturday afternoon.  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 Left leg swelling noted as well as redness.   Pt states urinary sxs include not being able to control bladder, states he gets the urge to use the bathroom, but by the time he gets the urge, he has already been incontinent.   Pt is taking oxycodone 2 tabs Q4H last dose was at 15:15  Pt had surgery at [facility] in [place].

## 2018-10-28 NOTE — ED Provider Notes (Signed)
[facility] Emergency Department Provider Note   ____________________________________________   I have reviewed the triage vital signs and the nursing notes.   HISTORY  Chief Complaint Post-op Problem and Urinary Frequency   History limited by: Not Limited   HPI Shawn Greene is a 68 y.o. male who presents to the emergency department today because of concerns for both left knee problems after recent knee replacement as well as a urinary issues.  The patient had knee replacement performed 3 days ago.  He states that since then he has noticed swelling and some erythema and warmth to his left knee and lower leg.  He did have concerns for possible blood clot stating that he has had blood clots in the past after surgeries.  Patient denies any chest pain or shortness of breath.  In addition the patient states that he has had some increased urinary frequency and urgency.  He states that he will not be able to make to the bathroom when he gets the urge to urinate.  He states he has noticed some numbness while wiping.  Denies any fevers.   Records reviewed. Per medical record review patient has a history of osteoarthritis of both knees.   Past Medical History:  Diagnosis Date  . Chronic tension-type headache, not intractable   . Essential (primary) hypertension   . Mixed hyperlipidemia   . Psoriasis   . Type 2 diabetes, controlled, with neuropathy ([place])   . 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 Dyspnea on exertion 07/05/2018  . Gastroesophageal reflux disease without esophagitis 03/28/2018  . Hyperlipidemia 02/11/2018  . Primary osteoarthritis of both knees 02/11/2018  . Encounter for general adult medical examination with abnormal findings 06/05/2017  . Type 2 diabetes mellitus with diabetic neuropathy, without long-term current use of insulin ([place]) 06/05/2017  . Uncontrolled type 2 diabetes mellitus  with hyperglycemia ([place]) 06/05/2017  . Essential hypertension, benign 06/05/2017  . Benign prostatic hyperplasia without lower urinary tract symptoms 06/05/2017  . Dysuria 06/05/2017  . Hyperlipidemia, unspecified 03/07/2017  . Hypertension 03/07/2017  . Osteoarthritis of knee 02/07/2017  . Cervico-occipital neuralgia of right side 02/04/2014  . Headache, unspecified headache type 02/04/2014    Past Surgical History:  Procedure Laterality Date  . ANKLE SURGERY    . COLECTOMY  2012    Prior to Admission medications   Medication Sig Start Date End Date Taking? Authorizing Provider  aspirin EC 81 MG tablet Take 81 mg by mouth.    [provider]  Blood Glucose Monitoring Suppl (GLUCOCOM BLOOD GLUCOSE MONITOR) DEVI FREESTYLE LITE METER.  use as directed to check blood sugars 03/08/17   [name], MD  DULoxetine (CYMBALTA) 20 MG capsule Take 20 mg daily for 2 weeks then increase to 40 mg daily 06/19/18   [provider]  gabapentin (NEURONTIN) 800 MG tablet Take 1 tablet (800 mg total) by mouth 3 (three) times daily. 07/05/18   Ronnell Freshwater, NP  gemfibrozil (LOPID) 600 MG tablet Take 1 tablet (600 mg total) by mouth 2 (two) times daily before a meal. 07/22/18   Ronnell Freshwater, NP  glimepiride (AMARYL) 1 MG tablet Take one tab po qd with lunch 07/05/18   Leretha Pol E, NP  glucose blood test strip 1 each by Other route daily. Use once a daily to check blood sugar (free style test strips )    [provider]  Lancets 28G MISC by Does not apply route daily.    [provider]  lisinopril (PRINIVIL,ZESTRIL) 20 MG tablet Take one tab po qd 03/28/18   Ronnell Freshwater, NP  metoprolol tartrate (LOPRESSOR) 50 MG tablet Take 1 tablet (50 mg total) by mouth 2 (two) times daily. 03/28/18   Ronnell Freshwater, NP  omeprazole (PRILOSEC) 20 MG capsule Take 1 capsule (20 mg total) by mouth daily. 07/05/18   Ronnell Freshwater, NP  tadalafil (CIALIS) 5 MG tablet Take  1 tablet (5 mg total) by mouth daily. 03/28/18   Ronnell Freshwater, NP    Allergies Other  Family History  Problem Relation Age of Onset  . Hypertension Mother     Social History Social History   Tobacco Use  . Smoking status: Never Smoker  . Smokeless tobacco: Never Used  Substance Use Topics  . Alcohol use: Yes    Alcohol/week: 1.0 standard drinks    Types: 1 Cans of beer per week    Comment: has not had any in 3 months drinks rarely  . Drug use: No    Review of Systems Constitutional: No fever/chills Eyes: No visual changes. ENT: No sore throat. Cardiovascular: Denies chest pain. Respiratory: Denies shortness of breath. Gastrointestinal: No abdominal pain.  No nausea, no vomiting.  No diarrhea.   Genitourinary: Positive for urinary urgency, discomfort.  Musculoskeletal: Positive for left knee pain and swelling.  Skin: Positive for erythema to the left knee. Neurological: Positive for some numbness to the anus.  ____________________________________________   PHYSICAL EXAM:  VITAL SIGNS: ED Triage Vitals [10/28/18 2002]  Enc Vitals Group     BP (!) 107/49     Pulse Rate (!) 135     Resp 20     Temp 99.1 F (37.3 C)     Temp Source Oral     SpO2 98 %     Weight 180 lb (81.6 kg)     Height 5\' 9"  (1.753 m)     Head Circumference      Peak Flow      Pain Score 9   Constitutional: Alert and oriented.  Eyes: Conjunctivae are normal.  ENT      Head: Normocephalic and atraumatic.      Nose: No congestion/rhinnorhea.      Mouth/Throat: Mucous membranes are moist.      Neck: No stridor. Hematological/Lymphatic/Immunilogical: No cervical lymphadenopathy. Cardiovascular: Normal rate, regular rhythm.  No murmurs, rubs, or gallops.  Respiratory: Normal respiratory effort without tachypnea nor retractions. Breath sounds are clear and equal bilaterally. No wheezes/rales/rhonchi. Gastrointestinal: Soft and non tender. No rebound. No guarding.  Genitourinary:  Deferred Musculoskeletal: Left knee and lower leg with swelling. Mild tenderness to palpation around the knee.  Neurologic:  Normal speech and language. No gross focal neurologic deficits are appreciated.  Skin:  Skin is warm, dry and intact. No significant erythema noted around left knee.  Psychiatric: Mood and affect are normal. Speech and behavior are normal. Patient exhibits appropriate insight and judgment.  ____________________________________________    LABS (pertinent positives/negatives)  Lactic 2.2 CBC wbc 13.4, hgb 11.5, plt 275 CMP wnl except k 3.3, co2, 21, glu 148, alb 3.4, t bili 1.3 UA clear, 0-5 wbc ____________________________________________   EKG  I, Nance Pear, attending physician, personally viewed and interpreted this EKG  EKG Time: 2007 Rate: 142 Rhythm: sinus tachycardia Axis: normal Intervals: qtc 495 QRS: RBBB ST changes: no st elevation Impression: abnormal ekg  ____________________________________________    RADIOLOGY [place] left lower extremity No evidence of DVT ____________________________________________   PROCEDURES  Procedures  ____________________________________________   INITIAL IMPRESSION / ASSESSMENT AND PLAN / ED COURSE  Pertinent labs & imaging results that were available during my care of the patient were reviewed by me and considered in my medical decision making (see chart for details).   Patient presented to the emergency department today because of concerns for both left knee issues after surgery and urinary issues.  Terms of the left knee.  Patient does have some swelling to the left knee and lower leg.  Ultrasound was performed which not show any blood clots.  Blood work did show mild lactic acidosis and leukocytosis.  Discussed with Dr. Harlow Mares who did not think antibiotics are necessary that this time and that those findings in the blood would be expected after surgery.  The patient urine was not consistent with a  urinary tract infection.  Given that he has had spinal anesthesia would raise perhaps some concern for hematoma or abscess.  Will get MRI.   ____________________________________________   FINAL CLINICAL IMPRESSION(S) / ED DIAGNOSES  Post op concern  Note: This dictation was prepared with Dragon dictation. Any transcriptional errors that result from this process are unintentional     Nance Pear, MD 10/28/18 2311

## 2018-10-28 NOTE — ED Provider Notes (Addendum)
Patient was signed out to follow-up MRI for postoperative pain.  On review of vitals, patient is persistently tachycardic to the 120s.  CBC shows leukocytosis and lab work shows elevated lactic acid.  On exam, he has significant streaking erythema medial to his operative site streaking to his groin.  There is significant warmth and tenderness extending throughout essentially the entire medial leg.  He did have an ultrasound which was negative.  Given his tachycardia, leukocytosis, lactic acidosis, and clinical cellulitis on exam, will start sepsis protocol with fluids, cultures, and initial nonpurulent antibiotics.  Agree that the knee itself does not appear infected, but will need to be watched closely.  MRI reviewed and is essentially unremarkable.  Chest x-ray negative.  CRITICAL CARE Performed by: Evonnie Pat   Total critical care time: 35 minutes  Critical care time was exclusive of separately billable procedures and treating other patients.  Critical care was necessary to treat or prevent imminent or life-threatening deterioration.  Critical care was time spent personally by me on the following activities: development of treatment plan with patient and/or surrogate as well as nursing, discussions with consultants, evaluation of patient's response to treatment, examination of patient, obtaining history from patient or surrogate, ordering and performing treatments and interventions, ordering and review of laboratory studies, ordering and review of radiographic studies, pulse oximetry and re-evaluation of patient's condition.        Duffy Bruce, MD 10/29/18 [id number]    Duffy Bruce, MD 10/29/18 [phone]

## 2018-10-29 ENCOUNTER — Inpatient Hospital Stay: Payer: PPO

## 2018-10-29 ENCOUNTER — Emergency Department: Payer: PPO

## 2018-10-29 DIAGNOSIS — N401 Enlarged prostate with lower urinary tract symptoms: Secondary | ICD-10-CM | POA: Diagnosis present

## 2018-10-29 DIAGNOSIS — Z8249 Family history of ischemic heart disease and other diseases of the circulatory system: Secondary | ICD-10-CM | POA: Diagnosis not present

## 2018-10-29 DIAGNOSIS — G8918 Other acute postprocedural pain: Secondary | ICD-10-CM | POA: Diagnosis present

## 2018-10-29 DIAGNOSIS — R350 Frequency of micturition: Secondary | ICD-10-CM | POA: Diagnosis present

## 2018-10-29 DIAGNOSIS — A419 Sepsis, unspecified organism: Secondary | ICD-10-CM | POA: Diagnosis present

## 2018-10-29 DIAGNOSIS — L0390 Cellulitis, unspecified: Secondary | ICD-10-CM | POA: Diagnosis present

## 2018-10-29 DIAGNOSIS — E785 Hyperlipidemia, unspecified: Secondary | ICD-10-CM | POA: Diagnosis present

## 2018-10-29 DIAGNOSIS — I10 Essential (primary) hypertension: Secondary | ICD-10-CM | POA: Diagnosis present

## 2018-10-29 DIAGNOSIS — R319 Hematuria, unspecified: Secondary | ICD-10-CM | POA: Diagnosis present

## 2018-10-29 DIAGNOSIS — Z7901 Long term (current) use of anticoagulants: Secondary | ICD-10-CM | POA: Diagnosis not present

## 2018-10-29 DIAGNOSIS — Z7982 Long term (current) use of aspirin: Secondary | ICD-10-CM | POA: Diagnosis not present

## 2018-10-29 DIAGNOSIS — E782 Mixed hyperlipidemia: Secondary | ICD-10-CM | POA: Diagnosis present

## 2018-10-29 DIAGNOSIS — Z79899 Other long term (current) drug therapy: Secondary | ICD-10-CM | POA: Diagnosis not present

## 2018-10-29 DIAGNOSIS — Z20828 Contact with and (suspected) exposure to other viral communicable diseases: Secondary | ICD-10-CM | POA: Diagnosis present

## 2018-10-29 DIAGNOSIS — Z7984 Long term (current) use of oral hypoglycemic drugs: Secondary | ICD-10-CM | POA: Diagnosis not present

## 2018-10-29 DIAGNOSIS — K219 Gastro-esophageal reflux disease without esophagitis: Secondary | ICD-10-CM | POA: Diagnosis present

## 2018-10-29 DIAGNOSIS — E1140 Type 2 diabetes mellitus with diabetic neuropathy, unspecified: Secondary | ICD-10-CM | POA: Diagnosis present

## 2018-10-29 LAB — GLUCOSE, CAPILLARY
Glucose-Capillary: 128 mg/dL — ABNORMAL HIGH (ref 70–99)
Glucose-Capillary: 166 mg/dL — ABNORMAL HIGH (ref 70–99)
Glucose-Capillary: 97 mg/dL (ref 70–99)

## 2018-10-29 LAB — TSH: TSH: 2.366 u[IU]/mL (ref 0.350–4.500)

## 2018-10-29 LAB — SARS CORONAVIRUS 2 (TAT 6-24 HRS): SARS Coronavirus 2: NEGATIVE

## 2018-10-29 MED ORDER — MORPHINE SULFATE (PF) 4 MG/ML IV SOLN
4.0000 mg | Freq: Once | INTRAVENOUS | Status: AC
  Administered 2018-10-29: 4 mg via INTRAVENOUS
  Filled 2018-10-29: qty 1

## 2018-10-29 MED ORDER — INSULIN ASPART 100 UNIT/ML ~~LOC~~ SOLN
0.0000 [IU] | Freq: Three times a day (TID) | SUBCUTANEOUS | Status: DC
  Administered 2018-10-29: 1 [IU] via SUBCUTANEOUS
  Administered 2018-10-29 – 2018-10-30 (×2): 2 [IU] via SUBCUTANEOUS
  Filled 2018-10-29 (×3): qty 1

## 2018-10-29 MED ORDER — GEMFIBROZIL 600 MG PO TABS
600.0000 mg | ORAL_TABLET | Freq: Two times a day (BID) | ORAL | Status: DC
  Administered 2018-10-29 – 2018-10-30 (×3): 600 mg via ORAL
  Filled 2018-10-29 (×4): qty 1

## 2018-10-29 MED ORDER — PANTOPRAZOLE SODIUM 40 MG PO TBEC
40.0000 mg | DELAYED_RELEASE_TABLET | Freq: Every day | ORAL | Status: DC
  Administered 2018-10-29 – 2018-10-30 (×2): 40 mg via ORAL
  Filled 2018-10-29 (×2): qty 1

## 2018-10-29 MED ORDER — POTASSIUM CHLORIDE IN NACL 40-0.9 MEQ/L-% IV SOLN
INTRAVENOUS | Status: DC
  Administered 2018-10-29 – 2018-10-30 (×2): 125 mL/h via INTRAVENOUS
  Filled 2018-10-29 (×6): qty 1000

## 2018-10-29 MED ORDER — INSULIN ASPART 100 UNIT/ML ~~LOC~~ SOLN: 0.0000 [IU] | Freq: Every day | SUBCUTANEOUS | Status: DC

## 2018-10-29 MED ORDER — ACETAMINOPHEN 325 MG PO TABS: 650.0000 mg | ORAL_TABLET | Freq: Four times a day (QID) | ORAL | Status: DC | PRN

## 2018-10-29 MED ORDER — RIVAROXABAN 10 MG PO TABS
10.0000 mg | ORAL_TABLET | Freq: Every day | ORAL | Status: DC
  Administered 2018-10-29: 10 mg via ORAL
  Filled 2018-10-29 (×2): qty 1

## 2018-10-29 MED ORDER — IOHEXOL 300 MG/ML  SOLN
100.0000 mL | Freq: Once | INTRAMUSCULAR | Status: AC | PRN
  Administered 2018-10-29: 100 mL via INTRAVENOUS

## 2018-10-29 MED ORDER — ACETAMINOPHEN 650 MG RE SUPP: 650.0000 mg | Freq: Four times a day (QID) | RECTAL | Status: DC | PRN

## 2018-10-29 MED ORDER — LISINOPRIL 20 MG PO TABS
20.0000 mg | ORAL_TABLET | Freq: Every day | ORAL | Status: DC
  Administered 2018-10-29 – 2018-10-30 (×2): 20 mg via ORAL
  Filled 2018-10-29 (×2): qty 1

## 2018-10-29 MED ORDER — VANCOMYCIN HCL 10 G IV SOLR
2000.0000 mg | Freq: Once | INTRAVENOUS | Status: AC
  Administered 2018-10-29: 2000 mg via INTRAVENOUS
  Filled 2018-10-29: qty 2000

## 2018-10-29 MED ORDER — SODIUM CHLORIDE 0.9 % IV SOLN
Freq: Once | INTRAVENOUS | Status: AC
  Administered 2018-10-29: 08:00:00 via INTRAVENOUS

## 2018-10-29 MED ORDER — PIPERACILLIN-TAZOBACTAM 3.375 G IVPB
3.3750 g | Freq: Three times a day (TID) | INTRAVENOUS | Status: DC
  Administered 2018-10-29 – 2018-10-30 (×4): 3.375 g via INTRAVENOUS
  Filled 2018-10-29 (×5): qty 50

## 2018-10-29 MED ORDER — VANCOMYCIN HCL 1.5 G IV SOLR
1500.0000 mg | INTRAVENOUS | Status: DC
  Administered 2018-10-30: 1500 mg via INTRAVENOUS
  Filled 2018-10-29 (×2): qty 1500

## 2018-10-29 MED ORDER — ONDANSETRON HCL 4 MG PO TABS: 4.0000 mg | ORAL_TABLET | Freq: Four times a day (QID) | ORAL | Status: DC | PRN

## 2018-10-29 MED ORDER — DULOXETINE HCL 20 MG PO CPEP
40.0000 mg | ORAL_CAPSULE | Freq: Every day | ORAL | Status: DC
  Administered 2018-10-29 – 2018-10-30 (×2): 40 mg via ORAL
  Filled 2018-10-29 (×2): qty 2

## 2018-10-29 MED ORDER — METOPROLOL TARTRATE 50 MG PO TABS
50.0000 mg | ORAL_TABLET | Freq: Two times a day (BID) | ORAL | Status: DC
  Administered 2018-10-29 – 2018-10-30 (×3): 50 mg via ORAL
  Filled 2018-10-29 (×3): qty 1

## 2018-10-29 MED ORDER — OXYCODONE HCL 5 MG PO TABS: 5.0000 mg | ORAL_TABLET | ORAL | Status: DC | PRN

## 2018-10-29 MED ORDER — SODIUM CHLORIDE 0.9 % IV BOLUS
1000.0000 mL | Freq: Once | INTRAVENOUS | Status: AC
  Administered 2018-10-29: 1000 mL via INTRAVENOUS

## 2018-10-29 MED ORDER — GABAPENTIN 400 MG PO CAPS
800.0000 mg | ORAL_CAPSULE | Freq: Three times a day (TID) | ORAL | Status: DC
  Administered 2018-10-29 – 2018-10-30 (×4): 800 mg via ORAL
  Filled 2018-10-29 (×4): qty 2

## 2018-10-29 MED ORDER — RAMELTEON 8 MG PO TABS
8.0000 mg | ORAL_TABLET | Freq: Every day | ORAL | Status: DC
  Administered 2018-10-29: 8 mg via ORAL
  Filled 2018-10-29 (×2): qty 1

## 2018-10-29 MED ORDER — ONDANSETRON HCL 4 MG/2ML IJ SOLN: 4.0000 mg | Freq: Four times a day (QID) | INTRAMUSCULAR | Status: DC | PRN

## 2018-10-29 MED ORDER — OXYCODONE-ACETAMINOPHEN 5-325 MG PO TABS
1.0000 | ORAL_TABLET | Freq: Four times a day (QID) | ORAL | Status: DC | PRN
  Administered 2018-10-29 – 2018-10-30 (×3): 1 via ORAL
  Filled 2018-10-29 (×3): qty 1

## 2018-10-29 MED ORDER — DOCUSATE SODIUM 100 MG PO CAPS
100.0000 mg | ORAL_CAPSULE | Freq: Two times a day (BID) | ORAL | Status: DC
  Administered 2018-10-29 – 2018-10-30 (×3): 100 mg via ORAL
  Filled 2018-10-29 (×3): qty 1

## 2018-10-29 MED ORDER — SODIUM CHLORIDE 0.9 % IV SOLN
2.0000 g | Freq: Once | INTRAVENOUS | Status: AC
  Administered 2018-10-29: 2 g via INTRAVENOUS
  Filled 2018-10-29: qty 20

## 2018-10-29 MED ORDER — MORPHINE SULFATE (PF) 2 MG/ML IV SOLN
2.0000 mg | INTRAVENOUS | Status: DC | PRN
  Administered 2018-10-29 – 2018-10-30 (×3): 2 mg via INTRAVENOUS
  Filled 2018-10-29 (×3): qty 1

## 2018-10-29 NOTE — Consult Note (Signed)
ORTHOPAEDIC CONSULTATION  REQUESTING PHYSICIAN: Bettey Costa, MD  Chief Complaint: Urinary frequency  HPI: Shawn Greene is a 68 y.o. male who complains of urinary frequency after a total knee replacement last Friday 10/25/2018. Please see H&P and ED notes for details. Denies any fever.  Past Medical History:  Diagnosis Date  . Chronic tension-type headache, not intractable   . Essential (primary) hypertension   . Mixed hyperlipidemia   . Psoriasis   . Type 2 diabetes, controlled, with neuropathy ([place])   . 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 ANKLE SURGERY    . COLECTOMY  2012   Social History   Socioeconomic History  . Marital status: Married    Spouse name: Not on file  . Number of children: Not on file  . Years of education: Not on file  . Highest education level: Not on file  Occupational History  . Not on file  Social Needs  . Financial resource strain: Not on file  . Food insecurity    Worry: Not on file    Inability: Not on file  . Transportation needs    Medical: Not on file    Non-medical: Not on file  Tobacco Use  . Smoking status: Never Smoker  . Smokeless tobacco: Never Used  Substance and Sexual Activity  . Alcohol use: Yes    Alcohol/week: 1.0 standard drinks    Types: 1 Cans of beer per week    Comment: has not had any in 3 months drinks rarely  . Drug use: No  . Sexual activity: Not on file  Lifestyle  . Physical activity    Days per week: Not on file    Minutes per session: Not on file  . Stress: Not on file  Relationships  . Social Herbalist on phone: Not on file    Gets together: Not on file    Attends religious service: Not on file    Active member of club or organization: Not on file    Attends meetings of clubs or organizations: Not on file    Relationship status: Not on file  Other Topics Concern  . Not on file  Social History Narrative  . Not on file    Family History  Problem Relation Age of Onset  . Hypertension Mother    No Known Allergies Prior to Admission medications   Medication Sig Start Date End Date Taking? Authorizing Provider  DULoxetine (CYMBALTA) 20 MG capsule Take 40 mg by mouth daily.  06/19/18  Yes [provider]  gabapentin (NEURONTIN) 800 MG tablet Take 1 tablet (800 mg total) by mouth 3 (three) times daily. 07/05/18  Yes Boscia, Greer Ee, NP  gemfibrozil (LOPID) 600 MG tablet Take 1 tablet (600 mg total) by mouth 2 (two) times daily before a meal. 07/22/18  Yes Boscia, Heather E, NP  glimepiride (AMARYL) 1 MG tablet Take one tab po qd with lunch Patient taking differently: Take 1 mg by mouth daily with supper.  07/05/18  Yes Boscia, Greer Ee, NP  lisinopril (PRINIVIL,ZESTRIL) 20 MG tablet Take one tab po qd Patient taking differently: Take 20 mg by mouth daily. Take one tab po qd 03/28/18  Yes Boscia, Heather E, NP  metoprolol tartrate (LOPRESSOR) 50 MG tablet Take 1 tablet (50 mg total) by mouth 2 (two) times daily. 03/28/18  Yes Ronnell Freshwater, NP  omeprazole (PRILOSEC) 20 MG capsule Take  1 capsule (20 mg total) by mouth daily. 07/05/18  Yes Boscia, Heather E, NP  oxyCODONE (OXY IR/ROXICODONE) 5 MG immediate release tablet Take 5 mg by mouth every 4 (four) hours as needed (pain).  10/26/18  Yes [provider]  XARELTO 10 MG TABS tablet Take 10 mg by mouth daily with supper.  10/26/18 11/04/18 Yes [provider]  aspirin EC 81 MG tablet Take 81 mg by mouth daily.     [provider]  Blood Glucose Monitoring Suppl (GLUCOCOM BLOOD GLUCOSE MONITOR) DEVI FREESTYLE LITE METER.  use as directed to check blood sugars 03/08/17   [name], MD  glucose blood test strip 1 each by Other route daily. Use once a daily to check blood sugar (free style test strips )    [provider]  Lancets 28G MISC by Does not apply route daily.    [provider]  meloxicam (MOBIC) 15 MG tablet  Take 15 mg by mouth daily. 10/21/18   [provider]   Dg Chest 2 View  Result Date: 10/29/2018 CLINICAL DATA:  Postop fever EXAM: CHEST - 2 VIEW COMPARISON:  June 01, 2010 FINDINGS: The heart size and mediastinal contours are within normal limits. Elevation of the right hemidiaphragm. Both lungs are clear. No acute osseous abnormality. IMPRESSION: No active cardiopulmonary disease. Electronically Signed   By: Prudencio Pair M.D.   On: 10/29/2018 00:25   Mr Lumbar Spine W Wo Contrast  Result Date: 10/29/2018 CLINICAL DATA:  Lower back pain EXAM: MRI LUMBAR SPINE WITHOUT AND WITH CONTRAST TECHNIQUE: Multiplanar and multiecho pulse sequences of the lumbar spine were obtained without and with intravenous contrast. CONTRAST:  8 mL Gadavist COMPARISON:  None. FINDINGS: Segmentation: There are 5 non-rib bearing lumbar type vertebral bodies. There is however a rudimentary disc seen at S1-S2 and the last inter disc space is labeled as S1-S2. Alignment:  Normal Vertebrae: The vertebral body heights are well maintained. No fracture, marrow edema,or pathologic marrow infiltration. There is mildly increased STIR signal with endplate reactive changes at L5-S1. Small anterior osteophytes seen throughout the lumbar spine. There is slightly heterogeneous appearance to the marrow. Conus medullaris and cauda equina: Conus extends to the L1 level. Conus and cauda equina appear normal. No abnormal intramedullary or leptomeningeal enhancement is seen. Paraspinal and other soft tissues: The paraspinal soft tissues and visualized retroperitoneal structures are unremarkable. The sacroiliac joints are intact. There is mildly enhancing paraspinal soft tissues at L3-L4 and inter spinous soft tissues at L3-L4. No enhancing loculated fluid collection. Disc levels: T12-L1:  No significant canal or neural foraminal narrowing. L1-L2:   No significant canal or neural foraminal narrowing. L2-L3: There is a minimal broad-based disc  bulge, however no significant canal or neural foraminal narrowing. L3-L4: There is a broad-based disc bulge with facet arthrosis which causes mild bilateral neural foraminal narrowing L4-L5: There is a broad-based disc bulge with facet arthrosis which causes mild bilateral neural foraminal narrowing. L5-S1: There is a broad-based disc bulge with facet arthrosis and a central disc protrusion which contacts the bilateral descending S1 nerve roots. There severe bilateral neural foraminal narrowing. IMPRESSION: 1. For the purposes of this dictation the last inter disc space is labeled as S1-S2. 2. 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 3. Mild enhancing soft tissue edema seen within the paraspinal soft tissues and intraspinous ligaments at L4-L5, likely from recent injection. Electronically Signed   By:  Prudencio Pair M.D.   On: 10/29/2018 00:16   Ct Abdomen Pelvis W Contrast  Result Date: 10/29/2018 CLINICAL DATA:  Acute abdominal pain with frequency and hematuria EXAM: CT ABDOMEN AND PELVIS WITH CONTRAST TECHNIQUE: Multidetector CT imaging of the abdomen and pelvis was performed using the standard protocol following bolus administration of intravenous contrast. CONTRAST:  168mL OMNIPAQUE IOHEXOL 300 MG/ML  SOLN COMPARISON:  05/23/2010 FINDINGS: Lower chest: Bibasilar atelectasis. Hepatobiliary: Low-attenuation of the liver as can be seen with hepatic steatosis. No focal hepatic mass. Mildly distended gallbladder. No other focal abnormality. Pancreas: Unremarkable. No pancreatic ductal dilatation or surrounding inflammatory changes. Spleen: Normal in size without focal abnormality. Adrenals/Urinary Tract: Adrenal glands are unremarkable. Kidneys are normal, without renal calculi, focal lesion, or hydronephrosis. Evaluation of bladder pathology is limited given that the bladder is decompressed with a Foley catheter present.  Stomach/Bowel: Stomach is within normal limits. No evidence of bowel wall thickening, distention, or inflammatory changes. Prior partial sigmoid resection. Vascular/Lymphatic: Normal caliber abdominal aorta with mild atherosclerosis. No lymphadenopathy. Reproductive: Prostate is unremarkable. Other: No abdominal wall hernia or abnormality. No abdominopelvic ascites. Musculoskeletal: No acute osseous abnormality. No aggressive osseous lesion. Degenerative disease with disc height loss at L4-5 with bilateral facet arthropathy and foraminal narrowing. IMPRESSION: 1. No acute abdominal or pelvic pathology. 2. Hepatic steatosis. 3. Evaluation of bladder pathology is limited given that the bladder is decompressed with a Foley catheter present. Electronically Signed   By: Kathreen Devoid   On: 10/29/2018 05:51   US Venous Img Lower Unilateral Left  Result Date: 10/28/2018 CLINICAL DATA:  Left lower extremity pain and swelling EXAM: LEFT LOWER EXTREMITY VENOUS DOPPLER ULTRASOUND TECHNIQUE: Gray-scale sonography with graded compression, as well as color Doppler and duplex ultrasound were performed to evaluate the lower extremity deep venous systems from the level of the common femoral vein and including the common femoral, femoral, profunda femoral, popliteal and calf veins including the posterior tibial, peroneal and gastrocnemius veins when visible. The superficial great saphenous vein was also interrogated. Spectral Doppler was utilized to evaluate flow at rest and with distal augmentation maneuvers in the common femoral, femoral and popliteal veins. COMPARISON:  None. FINDINGS: Contralateral Common Femoral Vein: Respiratory phasicity is normal and symmetric with the symptomatic side. No evidence of thrombus. Normal compressibility. Common Femoral Vein: No evidence of thrombus. Normal compressibility, respiratory phasicity and response to augmentation. Saphenofemoral Junction: No evidence of thrombus. Normal  compressibility and flow on color Doppler imaging. Profunda Femoral Vein: No evidence of thrombus. Normal compressibility and flow on color Doppler imaging. Femoral Vein: No evidence of thrombus. Normal compressibility, respiratory phasicity and response to augmentation. Popliteal Vein: No evidence of thrombus. Normal compressibility, respiratory phasicity and response to augmentation. Calf Veins: Somewhat suboptimal due to overlying subcutaneous edema, however no definite thrombus. Superficial Great Saphenous Vein: No evidence of thrombus. Normal compressibility. Venous Reflux:  None. Other Findings:  None. IMPRESSION: No evidence of deep venous thrombosis. Electronically Signed   By: Prudencio Pair M.D.   On: 10/28/2018 20:55    Positive ROS: All other systems have been reviewed and were otherwise negative with the exception of those mentioned in the HPI and as above.  Physical Exam: General: Alert, no acute distress Cardiovascular: No pedal edema Respiratory: No cyanosis, no use of accessory musculature GI: No organomegaly, abdomen is soft and non-tender Skin: No lesions in the area of chief complaint Neurologic: Sensation intact distally Psychiatric: Patient is competent for consent with normal mood and affect Lymphatic:  No axillary or cervical lymphadenopathy  MUSCULOSKELETAL: left knee with mild drainage, mild erythema, no warmth. Compartments soft. Good cap refill. Motor and sensory intact distally.  Assessment: Left total knee with appropriate healing at POD #4 Urinary symptoms  Plan: His dressing is changed. Recommend physical therapy. Will follow.    Lovell Sheehan, MD    10/29/2018 4:25 PM

## 2018-10-29 NOTE — H&P (Signed)
Shawn Greene is an 68 y.o. male.   Chief Complaint: Knee swelling HPI: The patient with past medical history of hypertension and diabetes presents to the emergency department complaining of swelling of his left knee.  The patient had a total knee replacement 3 days ago but has subsequently developed swelling and erythema of the knee streaking upward toward his groin.  He also reports increased urinary frequency and has passed a lot of clots in his urine following catheterization in the emergency department.  He denies fevers, nausea or vomiting.  Significant for elevated lactic acid as well as leukocytosis.  Sepsis protocol was initiated prior to the emergency department staff called the hospitalist service for admission.  Past Medical History:  Diagnosis Date  . Chronic tension-type headache, not intractable   . Essential (primary) hypertension   . Mixed hyperlipidemia   . Psoriasis   . Type 2 diabetes, controlled, with neuropathy ([place])   . 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 ANKLE SURGERY    . COLECTOMY  2012    Family History  Problem Relation Age of Onset  . Hypertension Mother    Social History:  reports that he has never smoked. He has never used smokeless tobacco. He reports current alcohol use of about 1.0 standard drinks of alcohol per week. He reports that he does not use drugs.  Allergies: No Known Allergies  Prior to Admission medications   Medication Sig Start Date End Date Taking? Authorizing Provider  DULoxetine (CYMBALTA) 20 MG capsule Take 40 mg by mouth daily.  06/19/18  Yes [provider]  gabapentin (NEURONTIN) 800 MG tablet Take 1 tablet (800 mg total) by mouth 3 (three) times daily. 07/05/18  Yes Boscia, Greer Ee, NP  gemfibrozil (LOPID) 600 MG tablet Take 1 tablet (600 mg total) by mouth 2 (two) times daily before a meal. 07/22/18  Yes Boscia, Heather E, NP  glimepiride (AMARYL) 1 MG tablet  Take one tab po qd with lunch Patient taking differently: Take 1 mg by mouth daily with supper.  07/05/18  Yes Boscia, Greer Ee, NP  lisinopril (PRINIVIL,ZESTRIL) 20 MG tablet Take one tab po qd Patient taking differently: Take 20 mg by mouth daily. Take one tab po qd 03/28/18  Yes Boscia, Heather E, NP  metoprolol tartrate (LOPRESSOR) 50 MG tablet Take 1 tablet (50 mg total) by mouth 2 (two) times daily. 03/28/18  Yes Boscia, Greer Ee, NP  omeprazole (PRILOSEC) 20 MG capsule Take 1 capsule (20 mg total) by mouth daily. 07/05/18  Yes Boscia, Heather E, NP  oxyCODONE (OXY IR/ROXICODONE) 5 MG immediate release tablet Take 5 mg by mouth every 4 (four) hours as needed (pain).  10/26/18  Yes [provider]  XARELTO 10 MG TABS tablet Take 10 mg by mouth daily with supper.  10/26/18 11/04/18 Yes [provider]  aspirin EC 81 MG tablet Take 81 mg by mouth daily.     [provider]  Blood Glucose Monitoring Suppl (GLUCOCOM BLOOD GLUCOSE MONITOR) DEVI FREESTYLE LITE METER.  use as directed to check blood sugars 03/08/17   [name], MD  glucose blood test strip 1 each by Other route daily. Use once a daily to check blood sugar (free style test strips )    [provider]  Lancets 28G MISC by Does not apply route daily.    [provider]  meloxicam (MOBIC) 15 MG tablet Take  15 mg by mouth daily. 10/21/18   [provider]     Results for orders placed or performed during the hospital encounter of 10/28/18 (from the past 48 hour(s))  Urinalysis, Complete w Microscopic     Status: Abnormal   Collection Time: 10/28/18  9:16 PM  Result Value Ref Range   Color, Urine YELLOW (A) YELLOW   APPearance CLEAR (A) CLEAR   Specific Gravity, Urine 1.008 1.005 - 1.030   pH 6.0 5.0 - 8.0   Glucose, UA NEGATIVE NEGATIVE mg/dL   Hgb urine dipstick NEGATIVE NEGATIVE   Bilirubin Urine NEGATIVE NEGATIVE   Ketones, ur NEGATIVE NEGATIVE mg/dL   Protein, ur NEGATIVE  NEGATIVE mg/dL   Nitrite NEGATIVE NEGATIVE   Leukocytes,Ua NEGATIVE NEGATIVE   WBC, UA 0-5 0 - 5 WBC/hpf   Bacteria, UA NONE SEEN NONE SEEN   Squamous Epithelial / LPF NONE SEEN 0 - 5    Comment: Performed at [facility], [address] [place], [place] [address]  CBC     Status: Abnormal   Collection Time: 10/28/18  9:16 PM  Result Value Ref Range   WBC 13.4 (H) 4.0 - 10.5 K/uL   RBC 3.80 (L) 4.22 - 5.81 MIL/uL   Hemoglobin 11.5 (L) 13.0 - 17.0 g/dL   HCT 34.0 (L) 39.0 - 52.0 %   MCV 89.5 80.0 - 100.0 fL   MCH 30.3 26.0 - 34.0 pg   MCHC 33.8 30.0 - 36.0 g/dL   RDW 13.0 11.5 - 15.5 %   Platelets 275 150 - 400 K/uL   nRBC 0.0 0.0 - 0.2 %    Comment: Performed at [facility], [address] [place], [place] [address]  Lactic acid, plasma     Status: Abnormal   Collection Time: 10/28/18  9:16 PM  Result Value Ref Range   Lactic Acid, Venous 2.2 (HH) 0.5 - 1.9 mmol/L    Comment: CRITICAL RESULT CALLED TO, READ BACK BY AND VERIFIED WITH VANESSA  ASHLEY ON 10/28/2018 AT 2148 TIK Performed at [facility], [address] [place], [place] [address]   Comprehensive metabolic panel     Status: Abnormal   Collection Time: 10/28/18  9:16 PM  Result Value Ref Range   Sodium 136 135 - 145 mmol/L   Potassium 3.3 (L) 3.5 - 5.1 mmol/L   Chloride 101 98 - 111 mmol/L   CO2 21 (L) 22 - 32 mmol/L   Glucose, Bld 148 (H) 70 - 99 mg/dL   BUN 21 8 - 23 mg/dL   Creatinine, Ser 1.13 0.61 - 1.24 mg/dL   Calcium 8.9 8.9 - 10.3 mg/dL   Total Protein 7.0 6.5 - 8.1 g/dL   Albumin 3.4 (L) 3.5 - 5.0 g/dL   AST 34 15 - 41 U/L   ALT 28 0 - 44 U/L   Alkaline Phosphatase 47 38 - 126 U/L   Total Bilirubin 1.3 (H) 0.3 - 1.2 mg/dL   GFR calc non Af Amer >60 >60 mL/min   GFR calc Af Amer >60 >60 mL/min   Anion gap 14 5 - 15    Comment: Performed at [facility], [address] [place], [place] [address]   Dg Chest 2 View  Result Date: 10/29/2018 CLINICAL DATA:   Postop fever EXAM: CHEST - 2 VIEW COMPARISON:  June 01, 2010 FINDINGS: The heart size and mediastinal contours are within normal limits. Elevation of the right hemidiaphragm. Both lungs are clear. No acute  osseous abnormality. IMPRESSION: No active cardiopulmonary disease. Electronically Signed   By: Prudencio Pair M.D.   On: 10/29/2018 00:25   Mr Lumbar Spine W Wo Contrast  Result Date: 10/29/2018 CLINICAL DATA:  Lower back pain EXAM: MRI LUMBAR SPINE WITHOUT AND WITH CONTRAST TECHNIQUE: Multiplanar and multiecho pulse sequences of the lumbar spine were obtained without and with intravenous contrast. CONTRAST:  8 mL Gadavist COMPARISON:  None. FINDINGS: Segmentation: There are 5 non-rib bearing lumbar type vertebral bodies. There is however a rudimentary disc seen at S1-S2 and the last inter disc space is labeled as S1-S2. Alignment:  Normal Vertebrae: The vertebral body heights are well maintained. No fracture, marrow edema,or pathologic marrow infiltration. There is mildly increased STIR signal with endplate reactive changes at L5-S1. Small anterior osteophytes seen throughout the lumbar spine. There is slightly heterogeneous appearance to the marrow. Conus medullaris and cauda equina: Conus extends to the L1 level. Conus and cauda equina appear normal. No abnormal intramedullary or leptomeningeal enhancement is seen. Paraspinal and other soft tissues: The paraspinal soft tissues and visualized retroperitoneal structures are unremarkable. The sacroiliac joints are intact. There is mildly enhancing paraspinal soft tissues at L3-L4 and inter spinous soft tissues at L3-L4. No enhancing loculated fluid collection. Disc levels: T12-L1:  No significant canal or neural foraminal narrowing. L1-L2:   No significant canal or neural foraminal narrowing. L2-L3: There is a minimal broad-based disc bulge, however no significant canal or neural foraminal narrowing. L3-L4: There is a broad-based disc bulge with facet arthrosis  which causes mild bilateral neural foraminal narrowing L4-L5: There is a broad-based disc bulge with facet arthrosis which causes mild bilateral neural foraminal narrowing. L5-S1: There is a broad-based disc bulge with facet arthrosis and a central disc protrusion which contacts the bilateral descending S1 nerve roots. There severe bilateral neural foraminal narrowing. IMPRESSION: 1. For the purposes of this dictation the last inter disc space is labeled as S1-S2. 2. 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 3. Mild enhancing soft tissue edema seen within the paraspinal soft tissues and intraspinous ligaments at L4-L5, likely from recent injection. Electronically Signed   By: Prudencio Pair M.D.   On: 10/29/2018 00:16   US Venous Img Lower Unilateral Left  Result Date: 10/28/2018 CLINICAL DATA:  Left lower extremity pain and swelling EXAM: LEFT LOWER EXTREMITY VENOUS DOPPLER ULTRASOUND TECHNIQUE: Gray-scale sonography with graded compression, as well as color Doppler and duplex ultrasound were performed to evaluate the lower extremity deep venous systems from the level of the common femoral vein and including the common femoral, femoral, profunda femoral, popliteal and calf veins including the posterior tibial, peroneal and gastrocnemius veins when visible. The superficial great saphenous vein was also interrogated. Spectral Doppler was utilized to evaluate flow at rest and with distal augmentation maneuvers in the common femoral, femoral and popliteal veins. COMPARISON:  None. FINDINGS: Contralateral Common Femoral Vein: Respiratory phasicity is normal and symmetric with the symptomatic side. No evidence of thrombus. Normal compressibility. Common Femoral Vein: No evidence of thrombus. Normal compressibility, respiratory phasicity and response to augmentation. Saphenofemoral Junction: No evidence of thrombus. Normal  compressibility and flow on color Doppler imaging. Profunda Femoral Vein: No evidence of thrombus. Normal compressibility and flow on color Doppler imaging. Femoral Vein: No evidence of thrombus. Normal compressibility, respiratory phasicity and response to augmentation. Popliteal Vein: No evidence of thrombus. Normal compressibility, respiratory phasicity and response to augmentation. Calf Veins: Somewhat suboptimal due  to overlying subcutaneous edema, however no definite thrombus. Superficial Great Saphenous Vein: No evidence of thrombus. Normal compressibility. Venous Reflux:  None. Other Findings:  None. IMPRESSION: No evidence of deep venous thrombosis. Electronically Signed   By: Prudencio Pair M.D.   On: 10/28/2018 20:55    Review of Systems  Constitutional: Negative for chills and fever.  HENT: Negative for sore throat and tinnitus.   Eyes: Negative for blurred vision and redness.  Respiratory: Negative for cough and shortness of breath.   Cardiovascular: Negative for chest pain, palpitations, orthopnea and PND.  Gastrointestinal: Negative for abdominal pain, diarrhea, nausea and vomiting.  Genitourinary: Positive for frequency and hematuria. Negative for dysuria and urgency.  Musculoskeletal: Negative for joint pain and myalgias.  Skin: Negative for rash.       No lesions  Neurological: Negative for speech change, focal weakness and weakness.  Endo/Heme/Allergies: Does not bruise/bleed easily.       No temperature intolerance  Psychiatric/Behavioral: Negative for depression and suicidal ideas.    Blood pressure 130/71, pulse (!) 112, temperature 99.1 F (37.3 C), temperature source Oral, resp. rate 17, height 5\' 9"  (1.753 m), weight 81.6 kg, SpO2 98 %. Physical Exam  Vitals reviewed. Constitutional: He is oriented to person, place, and time. He appears well-developed and well-nourished. No distress.  HENT:  Head: Normocephalic and atraumatic.  Mouth/Throat: Oropharynx is clear and  moist.  Eyes: Pupils are equal, round, and reactive to light. Conjunctivae and EOM are normal. No scleral icterus.  Neck: Normal range of motion. Neck supple. No JVD present. No tracheal deviation present. No thyromegaly present.  Cardiovascular: Normal rate, regular rhythm and normal heart sounds. Exam reveals no gallop and no friction rub.  No murmur heard. Respiratory: Effort normal and breath sounds normal. No respiratory distress.  GI: Soft. Bowel sounds are normal. He exhibits no distension. There is no abdominal tenderness.  Genitourinary:    Genitourinary Comments: Deferred   Musculoskeletal: Normal range of motion.        General: No edema.     Comments: Postoperative surgical wound left knee clean and dressed  Lymphadenopathy:    He has no cervical adenopathy.  Neurological: He is alert and oriented to person, place, and time. No cranial nerve deficit.  Skin: Skin is warm and dry. No rash noted. There is erythema (Streaking erythema of left inner thigh upward toward groin).  Psychiatric: He has a normal mood and affect. His behavior is normal. Judgment and thought content normal.     Assessment/Plan This is a 68 year old male admitted for cellulitis.   1.  Cellulitis: No purulence or dehiscence of wound.  Map surrounding erythema and track streaking. 2.  Sepsis: The patient's wound does not appear infected (per ED physician and nursing staff).  He is hemodynamically stable.  Follow blood cultures for growth and sensitivities.  Continue Vanco and Zosyn 3.  Hypertension: Controlled; continue metoprolol and lisinopril 4.  Diabetes mellitus type 2: Hold oral hypoglycemic agents.  Sliding scale insulin while hospitalized 5.  Hematuria: Clotting likely secondary to traumatic Foley insertion.  Obtain CT abdomen pelvis to rule out bladder mass 6.  DVT prophylaxis: Xarelto (following orthopedic surgery) 7.  GI prophylaxis: None The patient is a full code.  Time spent on admission orders  and patient care approximately 45 minutes  Harrie Foreman, MD 10/29/2018, 2:42 AM

## 2018-10-29 NOTE — Consult Note (Signed)
PHARMACY CONSULT NOTE - FOLLOW UP  Pharmacy Consult for Electrolyte Monitoring and Replacement   Recent Labs: Potassium (mmol/L)  Date Value  10/28/2018 3.3 (L)   Calcium (mg/dL)  Date Value  10/28/2018 8.9   Albumin (g/dL)  Date Value  10/28/2018 3.4 (L)  05/29/2017 4.3   Sodium (mmol/L)  Date Value  10/28/2018 136  10/15/2018 141     Assessment: Pharmacy has been consulted to monitor and replace Electrolytes in 68yo patient with possible cellulitis.   Goal of Therapy:  Electrolytes WNL  Plan:  K 3.3. Patient currently is receiving NS+KCl 55mEq continuous infusion at 129ml/hr. Will defer further replacement and recheck levels with AM labs.  Pearla Dubonnet ,PharmD Clinical Pharmacist 10/29/2018 10:44 AM

## 2018-10-29 NOTE — Progress Notes (Signed)
Chart reviewed.  Continue broad-spectrum antibiotics.  Consult placed for Dr. Harlow Mares to evaluate knee given recent surgery.  Pharmacy consultation for electrolyte repletion.  Labs for a.m.

## 2018-10-29 NOTE — Progress Notes (Signed)
PHARMACY -  BRIEF ANTIBIOTIC NOTE   Pharmacy has received consult(s) for Vancomycin from an ED provider.  The patient's profile has been reviewed for ht/wt/allergies/indication/available labs.    One time order(s) placed for Vancomycin 2g  Further antibiotics/pharmacy consults should be ordered by admitting physician if indicated.                       Thank you, Vira Blanco 10/29/2018  1:09 AM

## 2018-10-29 NOTE — ED Notes (Signed)
Pt found in bathroom with blood on self, legs and floor. MD called to assist. Blood appears to be coming from urethra.

## 2018-10-29 NOTE — Progress Notes (Signed)
Pharmacy Antibiotic Note  Shawn Greene is a 68 y.o. male admitted on 10/28/2018 with sepsis and cellulitis.  Pharmacy has been consulted for Vancomycin and Zosyn dosing.  Plan: Vancomycin 2000mg  IV loading dose followed by Vancomycin 1500 mg IV Q 24 hrs. Goal AUC 400-550. Expected AUC: 459.1 SCr used: 1.13 Expected Cmin: 9.9  Zosyn 3.375g IV q8h extended infusion  Watch renal function closely.   Height: 5\' 9"  (175.3 cm) Weight: 180 lb (81.6 kg) IBW/kg (Calculated) : 70.7  Temp (24hrs), Avg:99.1 F (37.3 C), Min:99.1 F (37.3 C), Max:99.1 F (37.3 C)  Recent Labs  Lab 10/28/18 2116  WBC 13.4*  CREATININE 1.13  LATICACIDVEN 2.2*    Estimated Creatinine Clearance: 61.7 mL/min (by C-G formula based on SCr of 1.13 mg/dL).    No Known Allergies  Antimicrobials this admission: Ceftriaxone 9/8 x 1 Vancomycin 9/8 >>  Zosyn 9/8 >>  Thank you for allowing pharmacy to be a part of this patient's care.  Paulina Fusi, PharmD, BCPS 10/29/2018 2:54 AM

## 2018-10-29 NOTE — ED Notes (Signed)
Pt helped to bathroom for BM.

## 2018-10-29 NOTE — ED Notes (Signed)
ED TO INPATIENT HANDOFF REPORT  ED Nurse Name and Phone #:  Jinny Blossom [phone]  S Name/Age/Gender Shawn Greene 68 y.o. male Room/Bed: ED06A/ED06A  Code Status   Code Status: Not on file  Home/SNF/Other Home Patient oriented to: self, place, time and situation Is this baseline? Yes   Triage Complete: Triage complete  Chief Complaint Left knee pain  Triage Note Pt arrived via [place] with reports of left knee swelling and redness since Saturday afternoon as well as urinary frequency since Saturday afternoon.  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 Left leg swelling noted as well as redness.   Pt states urinary sxs include not being able to control bladder, states he gets the urge to use the bathroom, but by the time he gets the urge, he has already been incontinent.   Pt is taking oxycodone 2 tabs Q4H last dose was at 15:15  Pt had surgery at [facility] in [place].   Allergies No Known Allergies  Level of Care/Admitting Diagnosis ED Disposition    ED Disposition Condition [facility] Area: [place] [[id number]]  Level of Care: Med-Surg [16]  Covid Evaluation: Asymptomatic Screening Protocol (No Symptoms)  Diagn[facility]: [id number]  Admitting Physician: Harrie Foreman [id number]  Attending Physician: Harrie Foreman [id number]  Estimated length of stay: past midnight tomorrow  Certification:: I certify this patient will need inpatient services for at least 2 midnights  PT Class (Do Not Modify): Inpatient [101]  PT Acc Code (Do Not Modify): Private [1]       B Medical/Surgery History Past Medical History:  Diagnosis Date  . Chronic tension-type headache, not intractable   . Essential (primary) hypertension   . Mixed hyperlipidemia   . Psoriasis   . Type 2 diabetes, controlled, with neuropathy ([place])   . 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 ANKLE SURGERY    . COLECTOMY  2012     A IV Location/Drains/Wounds Patient Lines/Drains/Airways Status   Active Line/Drains/Airways    Name:   Placement date:   Placement time:   Site:   Days:   Peripheral IV 10/29/18 Left Wrist   10/29/18    0000    Wrist   less than 1   Peripheral IV 10/29/18 Right Wrist   10/29/18    0056    Wrist   less than 1   Urethral Catheter Marcy Salvo RN Straight-tip 14 Fr.   10/29/18    0437    Straight-tip   less than 1          Intake/Output Last 24 hours No intake or output data in the 24 hours ending 10/29/18 [id number]  Labs/Imaging Results for orders placed or performed during the hospital encounter of 10/28/18 (from the past 48 hour(s))  Urinalysis, Complete w Microscopic     Status: Abnormal   Collection Time: 10/28/18  9:16 PM  Result Value Ref Range   Color, Urine YELLOW (A) YELLOW   APPearance CLEAR (A) CLEAR   Specific Gravity, Urine 1.008 1.005 - 1.030   pH 6.0 5.0 - 8.0   Glucose, UA NEGATIVE NEGATIVE mg/dL   Hgb urine dipstick NEGATIVE NEGATIVE   Bilirubin Urine NEGATIVE NEGATIVE   Ketones, ur NEGATIVE NEGATIVE mg/dL   Protein, ur NEGATIVE NEGATIVE mg/dL   Nitrite NEGATIVE NEGATIVE   Leukocytes,Ua NEGATIVE NEGATIVE   WBC,  UA 0-5 0 - 5 WBC/hpf   Bacteria, UA NONE SEEN NONE SEEN   Squamous Epithelial / LPF NONE SEEN 0 - 5    Comment: Performed at [facility], [address] [place], [place] [address]  CBC     Status: Abnormal   Collection Time: 10/28/18  9:16 PM  Result Value Ref Range   WBC 13.4 (H) 4.0 - 10.5 K/uL   RBC 3.80 (L) 4.22 - 5.81 MIL/uL   Hemoglobin 11.5 (L) 13.0 - 17.0 g/dL   HCT 34.0 (L) 39.0 - 52.0 %   MCV 89.5 80.0 - 100.0 fL   MCH 30.3 26.0 - 34.0 pg   MCHC 33.8 30.0 - 36.0 g/dL   RDW 13.0 11.5 - 15.5 %   Platelets 275 150 - 400 K/uL   nRBC 0.0 0.0 - 0.2 %    Comment: Performed at [facility], [address] [place], [place] [address]  Lactic acid,  plasma     Status: Abnormal   Collection Time: 10/28/18  9:16 PM  Result Value Ref Range   Lactic Acid, Venous 2.2 (HH) 0.5 - 1.9 mmol/L    Comment: CRITICAL RESULT CALLED TO, READ BACK BY AND VERIFIED WITH VANESSA  ASHLEY ON 10/28/2018 AT 2148 TIK Performed at [facility], [address] [place], [place] [address]   Comprehensive metabolic panel     Status: Abnormal   Collection Time: 10/28/18  9:16 PM  Result Value Ref Range   Sodium 136 135 - 145 mmol/L   Potassium 3.3 (L) 3.5 - 5.1 mmol/L   Chloride 101 98 - 111 mmol/L   CO2 21 (L) 22 - 32 mmol/L   Glucose, Bld 148 (H) 70 - 99 mg/dL   BUN 21 8 - 23 mg/dL   Creatinine, Ser 1.13 0.61 - 1.24 mg/dL   Calcium 8.9 8.9 - 10.3 mg/dL   Total Protein 7.0 6.5 - 8.1 g/dL   Albumin 3.4 (L) 3.5 - 5.0 g/dL   AST 34 15 - 41 U/L   ALT 28 0 - 44 U/L   Alkaline Phosphatase 47 38 - 126 U/L   Total Bilirubin 1.3 (H) 0.3 - 1.2 mg/dL   GFR calc non Af Amer >60 >60 mL/min   GFR calc Af Amer >60 >60 mL/min   Anion gap 14 5 - 15    Comment: Performed at [facility], [address] [place], [place] [address]  Blood culture (routine x 2)     Status: None (Preliminary result)   Collection Time: 10/29/18 12:53 AM   Specimen: BLOOD  Result Value Ref Range   Specimen Description BLOOD BLOOD LEFT WRIST    Special Requests      BOTTLES DRAWN AEROBIC AND ANAEROBIC Blood Culture results may not be optimal due to an excessive volume of blood received in culture bottles   Culture      NO GROWTH < 12 HOURS Performed at [facility], [address] [place], [place] [address]    Report Status PENDING   Blood culture (routine x 2)     Status: None (Preliminary result)   Collection Time: 10/29/18 12:53 AM   Specimen: BLOOD  Result Value Ref Range   Specimen Description BLOOD BLOOD RIGHT WRIST    Special Requests      BOTTLES DRAWN AEROBIC AND ANAEROBIC Blood Culture results may not be optimal due to an excessive volume of  blood received in culture bottles   Culture  NO GROWTH < 12 HOURS Performed at [facility], [address] [place], [place] [address]    Report Status PENDING    Dg Chest 2 View  Result Date: 10/29/2018 CLINICAL DATA:  Postop fever EXAM: CHEST - 2 VIEW COMPARISON:  June 01, 2010 FINDINGS: The heart size and mediastinal contours are within normal limits. Elevation of the right hemidiaphragm. Both lungs are clear. No acute osseous abnormality. IMPRESSION: No active cardiopulmonary disease. Electronically Signed   By: Prudencio Pair M.D.   On: 10/29/2018 00:25   Mr Lumbar Spine W Wo Contrast  Result Date: 10/29/2018 CLINICAL DATA:  Lower back pain EXAM: MRI LUMBAR SPINE WITHOUT AND WITH CONTRAST TECHNIQUE: Multiplanar and multiecho pulse sequences of the lumbar spine were obtained without and with intravenous contrast. CONTRAST:  8 mL Gadavist COMPARISON:  None. FINDINGS: Segmentation: There are 5 non-rib bearing lumbar type vertebral bodies. There is however a rudimentary disc seen at S1-S2 and the last inter disc space is labeled as S1-S2. Alignment:  Normal Vertebrae: The vertebral body heights are well maintained. No fracture, marrow edema,or pathologic marrow infiltration. There is mildly increased STIR signal with endplate reactive changes at L5-S1. Small anterior osteophytes seen throughout the lumbar spine. There is slightly heterogeneous appearance to the marrow. Conus medullaris and cauda equina: Conus extends to the L1 level. Conus and cauda equina appear normal. No abnormal intramedullary or leptomeningeal enhancement is seen. Paraspinal and other soft tissues: The paraspinal soft tissues and visualized retroperitoneal structures are unremarkable. The sacroiliac joints are intact. There is mildly enhancing paraspinal soft tissues at L3-L4 and inter spinous soft tissues at L3-L4. No enhancing loculated fluid collection. Disc levels: T12-L1:  No significant canal or neural foraminal  narrowing. L1-L2:   No significant canal or neural foraminal narrowing. L2-L3: There is a minimal broad-based disc bulge, however no significant canal or neural foraminal narrowing. L3-L4: There is a broad-based disc bulge with facet arthrosis which causes mild bilateral neural foraminal narrowing L4-L5: There is a broad-based disc bulge with facet arthrosis which causes mild bilateral neural foraminal narrowing. L5-S1: There is a broad-based disc bulge with facet arthrosis and a central disc protrusion which contacts the bilateral descending S1 nerve roots. There severe bilateral neural foraminal narrowing. IMPRESSION: 1. For the purposes of this dictation the last inter disc space is labeled as S1-S2. 2. 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 3. Mild enhancing soft tissue edema seen within the paraspinal soft tissues and intraspinous ligaments at L4-L5, likely from recent injection. Electronically Signed   By: Prudencio Pair M.D.   On: 10/29/2018 00:16   US Venous Img Lower Unilateral Left  Result Date: 10/28/2018 CLINICAL DATA:  Left lower extremity pain and swelling EXAM: LEFT LOWER EXTREMITY VENOUS DOPPLER ULTRASOUND TECHNIQUE: Gray-scale sonography with graded compression, as well as color Doppler and duplex ultrasound were performed to evaluate the lower extremity deep venous systems from the level of the common femoral vein and including the common femoral, femoral, profunda femoral, popliteal and calf veins including the posterior tibial, peroneal and gastrocnemius veins when visible. The superficial great saphenous vein was also interrogated. Spectral Doppler was utilized to evaluate flow at rest and with distal augmentation maneuvers in the common femoral, femoral and popliteal veins. COMPARISON:  None. FINDINGS: Contralateral Common Femoral Vein: Respiratory phasicity is normal and symmetric with the symptomatic  side. No evidence of thrombus. Normal compressibility. Common Femoral Vein: No evidence of  thrombus. Normal compressibility, respiratory phasicity and response to augmentation. Saphenofemoral Junction: No evidence of thrombus. Normal compressibility and flow on color Doppler imaging. Profunda Femoral Vein: No evidence of thrombus. Normal compressibility and flow on color Doppler imaging. Femoral Vein: No evidence of thrombus. Normal compressibility, respiratory phasicity and response to augmentation. Popliteal Vein: No evidence of thrombus. Normal compressibility, respiratory phasicity and response to augmentation. Calf Veins: Somewhat suboptimal due to overlying subcutaneous edema, however no definite thrombus. Superficial Great Saphenous Vein: No evidence of thrombus. Normal compressibility. Venous Reflux:  None. Other Findings:  None. IMPRESSION: No evidence of deep venous thrombosis. Electronically Signed   By: Prudencio Pair M.D.   On: 10/28/2018 20:55    Pending Labs Unresulted Labs (From admission, onward)    Start     Ordered   10/29/18 0541  SARS CORONAVIRUS 2 (TAT 6-24 HRS) Nasopharyngeal Nasopharyngeal Swab  (Asymptomatic/Tier 2 Patients Labs)  Once,   STAT    Question Answer Comment  Is this test for diagnosis or screening Screening   Symptomatic for COVID-19 as defined by CDC No   Hospitalized for COVID-19 No   Admitted to ICU for COVID-19 No   Previously tested for COVID-19 No   Resident in a congregate (group) care setting No   Employed in healthcare setting No      10/29/18 0540   Signed and Held  TSH  Add-on,   R     Signed and Held          Vitals/Pain Today's Vitals   10/28/18 2002 10/29/18 0017 10/29/18 0200  BP: (!) 107/49 130/71   Pulse: (!) 135 (!) 112   Resp: 20 17   Temp: 99.1 F (37.3 C)    TempSrc: Oral    SpO2: 98% 98%   Weight: 81.6 kg    Height: 5\' 9"  (1.753 m)    PainSc: 9   4     Isolation Precautions No active  isolations  Medications Medications  0.9 %  sodium chloride infusion (has no administration in time range)  insulin aspart (novoLOG) injection 0-9 Units (has no administration in time range)  insulin aspart (novoLOG) injection 0-5 Units (has no administration in time range)  vancomycin (VANCOCIN) 1,500 mg in sodium chloride 0.9 % 500 mL IVPB (has no administration in time range)  piperacillin-tazobactam (ZOSYN) IVPB 3.375 g (has no administration in time range)  morphine 4 MG/ML injection 4 mg (4 mg Intravenous Given 10/28/18 2132)  gadobutrol (GADAVIST) 1 MMOL/ML injection 8 mL (8 mLs Intravenous Contrast Given 10/29/18 0002)  cefTRIAXone (ROCEPHIN) 2 g in sodium chloride 0.9 % 100 mL IVPB (0 g Intravenous Stopped 10/29/18 0216)  morphine 4 MG/ML injection 4 mg (4 mg Intravenous Given 10/29/18 0114)  sodium chloride 0.9 % bolus 1,000 mL (0 mLs Intravenous Stopped 10/29/18 0445)  vancomycin (VANCOCIN) 2,000 mg in sodium chloride 0.9 % 500 mL IVPB (0 mg Intravenous Stopped 10/29/18 0445)  iohexol (OMNIPAQUE) 300 MG/ML solution 100 mL (100 mLs Intravenous Contrast Given 10/29/18 0501)    Mobility Low fall risk   Focused Assessments See H&P   R Recommendations: See Admitting Provider Note  Report given to:   Additional Notes:

## 2018-10-30 DIAGNOSIS — N390 Urinary tract infection, site not specified: Secondary | ICD-10-CM | POA: Insufficient documentation

## 2018-10-30 LAB — CBC
HCT: 28.2 % — ABNORMAL LOW (ref 39.0–52.0)
Hemoglobin: 9.4 g/dL — ABNORMAL LOW (ref 13.0–17.0)
MCH: 30 pg (ref 26.0–34.0)
MCHC: 33.3 g/dL (ref 30.0–36.0)
MCV: 90.1 fL (ref 80.0–100.0)
Platelets: 269 10*3/uL (ref 150–400)
RBC: 3.13 MIL/uL — ABNORMAL LOW (ref 4.22–5.81)
RDW: 13.2 % (ref 11.5–15.5)
WBC: 8.2 10*3/uL (ref 4.0–10.5)
nRBC: 0 % (ref 0.0–0.2)

## 2018-10-30 LAB — BASIC METABOLIC PANEL
Anion gap: 9 (ref 5–15)
BUN: 16 mg/dL (ref 8–23)
CO2: 23 mmol/L (ref 22–32)
Calcium: 8.4 mg/dL — ABNORMAL LOW (ref 8.9–10.3)
Chloride: 110 mmol/L (ref 98–111)
Creatinine, Ser: 0.9 mg/dL (ref 0.61–1.24)
GFR calc Af Amer: >60 mL/min (ref >60)
GFR calc non Af Amer: >60 mL/min (ref >60)
Glucose, Bld: 124 mg/dL — ABNORMAL HIGH (ref 70–99)
Potassium: 4.3 mmol/L (ref 3.5–5.1)
Sodium: 142 mmol/L (ref 135–145)

## 2018-10-30 LAB — GLUCOSE, CAPILLARY
Glucose-Capillary: 122 mg/dL — ABNORMAL HIGH (ref 70–99)
Glucose-Capillary: 110 mg/dL — ABNORMAL HIGH (ref 70–99)
Glucose-Capillary: 162 mg/dL — ABNORMAL HIGH (ref 70–99)

## 2018-10-30 LAB — MAGNESIUM: Magnesium: 1.8 mg/dL (ref 1.7–2.4)

## 2018-10-30 LAB — PHOSPHORUS: Phosphorus: 2.9 mg/dL (ref 2.5–4.6)

## 2018-10-30 MED ORDER — OXYCODONE-ACETAMINOPHEN 7.5-325 MG PO TABS: 1.0000 | ORAL_TABLET | ORAL | 0 refills | Status: DC | PRN

## 2018-10-30 MED ORDER — AMOXICILLIN-POT CLAVULANATE 875-125 MG PO TABS: 1.0000 | ORAL_TABLET | Freq: Two times a day (BID) | ORAL | 0 refills | Status: DC

## 2018-10-30 MED ORDER — TAMSULOSIN HCL 0.4 MG PO CAPS: 0.4000 mg | ORAL_CAPSULE | Freq: Every day | ORAL | 0 refills | Status: DC

## 2018-10-30 NOTE — Evaluation (Signed)
Physical Therapy Evaluation Patient Details Name: Shawn Greene MRN: [id number] DOB: [birth year] Today's Date: 10/30/2018   History of Present Illness  presented to ER secondary to L LE edema, redness; admitted for management of cellulitis. Recent medical history significant for L TKR (10/25/18) at outside facility by Dr. Harlow Mares, [facility].  Clinical Impression  Upon evaluation, patient alert and oriented; follows commands and demonstrates good effort with session despite pain (rated 8/10).  Demonstrates fair/good post-op strength (at least 3-/5) and ROM (3-85 degrees) to L LE.  Able to complete bed mobility with mod indep; sit/stand, basic transfers and gait (10') with RW, close sup.  Heavy WBing on RW with standing/gait efforts due to pain; additional distance declined (by patient) due to pain.  Anticipate good mobility progression once pain controlled (requested and administered per RN during session). Would benefit from skilled PT to address above deficits and promote optimal return to PLOF; recommend transition to home with outpatient PT follow up per surgeon.    Follow Up Recommendations Outpatient PT(scheduled for initial outpatient appt 9/11 per patient)    Equipment Recommendations       Recommendations for Other Services       Precautions / Restrictions Precautions Precautions: Fall Restrictions Weight Bearing Restrictions: Yes LLE Weight Bearing: Weight bearing as tolerated      Mobility  Bed Mobility Overal bed mobility: Modified Independent Bed Mobility: Sit to Supine       Sit to supine: Modified independent (Device/Increase time)   General bed mobility comments: increased time/effort to raise L LE over edge of bed, self-assisting with bilat UEs as needed  Transfers Overall transfer level: Needs assistance Equipment used: Rolling walker (2 wheeled) Transfers: Sit to/from Stand Sit to Stand: Supervision            Ambulation/Gait Ambulation/Gait assistance:  Supervision Gait Distance (Feet): 10 Feet Assistive device: Rolling walker (2 wheeled)       General Gait Details: step to gait pattern, heavy WBing bilat UEs; decreased stance time, TKE L LE in loading phases, limited by pain.  Declines additional gait distance at this time due to pain.  Stairs            Wheelchair Mobility    Modified Rankin (Stroke Patients Only)       Balance Overall balance assessment: Needs assistance Sitting-balance support: No upper extremity supported;Feet supported Sitting balance-Leahy Scale: Good     Standing balance support: Bilateral upper extremity supported Standing balance-Leahy Scale: Fair                               Pertinent Vitals/Pain Pain Assessment: Faces Faces Pain Scale: Hurts whole lot Pain Location: L knee Pain Descriptors / Indicators: Aching;Guarding;Grimacing;Moaning Pain Intervention(s): Limited activity within patient's tolerance;Monitored during session;Repositioned;RN gave pain meds during session;Patient requesting pain meds-RN notified    Home Living Family/patient expects to be discharged to:: Private residence   Available Help at Discharge: Family Type of Home: House Home Access: Stairs to enter Entrance Stairs-Rails: None Entrance Stairs-Number of Steps: 2 Home Layout: One level Home Equipment: Walker - 2 wheels      Prior Function Level of Independence: Independent         Comments: Indep with ADLs, household and community mobilization without assist device; recent use of RW since TKR     Hand Dominance        Extremity/Trunk Assessment   Upper Extremity Assessment Upper Extremity Assessment: Overall  WFL for tasks assessed    Lower Extremity Assessment Lower Extremity Assessment: (L knee grossly 3-/5, limited by pain; generally edematous throughout. Otherwise, bilat LEs grossly WFL)       Communication   Communication: No difficulties  Cognition Arousal/Alertness:  Awake/alert Behavior During Therapy: WFL for tasks assessed/performed Overall Cognitive Status: Within Functional Limits for tasks assessed                                        General Comments      Exercises Total Joint Exercises Goniometric ROM: L knee: 3-85 degrees, act assist Other Exercises Other Exercises: Supine L LE therex, 1x10, act assist ROM: ankle pumps, quad sets, SAQs, heel slides, hip abduct/adduct and SLR.  Incresaed time due to pain, but demonstrates fair/good post-op ROM with supine therex.   Assessment/Plan    PT Assessment Patient needs continued PT services  PT Problem List Decreased strength;Decreased range of motion;Decreased activity tolerance;Decreased balance;Decreased mobility;Decreased knowledge of use of DME;Decreased safety awareness;Decreased knowledge of precautions;Pain       PT Treatment Interventions DME instruction;Gait training;Stair training;Functional mobility training;Therapeutic activities;Therapeutic exercise;Balance training;Patient/family education    PT Goals (Current goals can be found in the Care Plan section)  Acute Rehab PT Goals Patient Stated Goal: to return home PT Goal Formulation: With patient/family Time For Goal Achievement: 11/13/18 Potential to Achieve Goals: Good    Frequency 7X/week   Barriers to discharge        Co-evaluation               AM-PAC PT "6 Clicks" Mobility  Outcome Measure Help needed turning from your back to your side while in a flat bed without using bedrails?: None Help needed moving from lying on your back to sitting on the side of a flat bed without using bedrails?: None Help needed moving to and from a bed to a chair (including a wheelchair)?: None Help needed standing up from a chair using your arms (e.g., wheelchair or bedside chair)?: None Help needed to walk in hospital room?: A Little Help needed climbing 3-5 steps with a railing? : A Little 6 Click Score:  22    End of Session Equipment Utilized During Treatment: Gait belt Activity Tolerance: Patient limited by pain Patient left: in bed;with call bell/phone within reach;with family/visitor present Nurse Communication: Mobility status PT Visit Diagnosis: Muscle weakness (generalized) (M62.81);Difficulty in walking, not elsewhere classified (R26.2);Pain Pain - Right/Left: Left Pain - part of body: Knee    Time: [id number] PT Time Calculation (min) (ACUTE ONLY): 22 min   Charges:   PT Evaluation $PT Eval Moderate Complexity: 1 Mod PT Treatments $Therapeutic Exercise: 8-22 mins        [facility] Currin H. Owens Shark, PT, DPT, NCS 10/30/18, 10:25 AM [phone]

## 2018-10-30 NOTE — Progress Notes (Signed)
   10/30/18 0800  Clinical Encounter Type  Visited With Patient not available;Health care provider  Visit Type Initial;Spiritual support  Referral From Nurse  Consult/Referral To Chaplain   Chaplain received an OR for prayer for the patient. The patient's nurse reported that he was asleep, but easily awakened. This chaplain will pass this request on to another member of the spiritual care team for later follow-up.

## 2018-10-30 NOTE — Discharge Summary (Signed)
[facility] NAME: Shawn Greene    MR#:  [id number]  DATE OF BIRTH:  1951/01/11  DATE OF ADMISSION:  10/28/2018 ADMITTING PHYSICIAN: Harrie Foreman, MD  DATE OF DISCHARGE: 10/30/2018  PRIMARY CARE PHYSICIAN: [name], MD    ADMISSION DIAGNOSIS:  Sepsis due to cellulitis ([place]) [L03.90, A41.9]  DISCHARGE DIAGNOSIS:  Active Problems:   Knee pain   SECONDARY DIAGNOSIS:   Past Medical History:  Diagnosis Date  . Chronic tension-type headache, not intractable   . Essential (primary) hypertension   . Mixed hyperlipidemia   . Psoriasis   . Type 2 diabetes, controlled, with neuropathy ([place])   . 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   1.  Left knee pain: Patient is postoperative day 4 left total knee replacement.  Patient evaluated by Dr. Harlow Mares who performed surgery.  Patient has no signs of acute infection as per Dr. Harlow Mares.  Patient initially admitted with sepsis.  Sepsis ruled out.  Patient will be discharged empirically on Augmentin.  Again at this timeit is not felt patient has cellulitis or infection.  Patient will follow-up with Dr. Harlow Mares this week.  Patient will continue outpatient PT.  His dressing was changed by Dr. Harlow Mares during his hospital stay.  He will continue with pain medications. He is on Xarelto for DVT prophylaxis. 2.  Hematuria after insertion of Foley with no acute changes on CT scan/BPH. Patient started on Flomax for enlarged prostate.  3.  Diabetes: Patient will continue outpatient regimen and ADA diet  4.  Essential hypertension: Continue metoprolol and lisinopril  DISCHARGE CONDITIONS AND DIET:   Stable for discharge diabetic diet   CONSULTS OBTAINED:    DRUG ALLERGIES:  No Known Allergies  DISCHARGE MEDICATIONS:   Allergies as of 10/30/2018   No Known  Allergies     Medication List    STOP taking these medications   aspirin EC 81 MG tablet   meloxicam 15 MG tablet Commonly known as: MOBIC   oxyCODONE 5 MG immediate release tablet Commonly known as: Oxy IR/ROXICODONE     TAKE these medications   amoxicillin-clavulanate 875-125 MG tablet Commonly known as: Augmentin Take 1 tablet by mouth 2 (two) times daily.   DULoxetine 20 MG capsule Commonly known as: CYMBALTA Take 40 mg by mouth daily.   gabapentin 800 MG tablet Commonly known as: NEURONTIN Take 1 tablet (800 mg total) by mouth 3 (three) times daily.   gemfibrozil 600 MG tablet Commonly known as: LOPID Take 1 tablet (600 mg total) by mouth 2 (two) times daily before a meal.   glimepiride 1 MG tablet Commonly known as: Amaryl Take one tab po qd with lunch What changed:   how much to take  how to take this  when to take this  additional instructions   GlucoCom Blood Glucose Monitor Devi FREESTYLE LITE METER.  use as directed to check blood sugars   glucose blood test strip 1 each by Other route daily. Use once a daily to check blood sugar (free style test strips )   Lancets 28G Misc by Does not apply route daily.   lisinopril 20 MG tablet Commonly known as: ZESTRIL Take one tab po qd What changed:   how much to take  how to take this  when to take  this   metoprolol tartrate 50 MG tablet Commonly known as: LOPRESSOR Take 1 tablet (50 mg total) by mouth 2 (two) times daily.   omeprazole 20 MG capsule Commonly known as: PRILOSEC Take 1 capsule (20 mg total) by mouth daily.   oxyCODONE-acetaminophen 7.5-325 MG tablet Commonly known as: Percocet Take 1 tablet by mouth every 4 (four) hours as needed for severe pain.   tamsulosin 0.4 MG Caps capsule Commonly known as: Flomax Take 1 capsule (0.4 mg total) by mouth daily after supper.   Xarelto 10 MG Tabs tablet Generic drug: rivaroxaban Take 10 mg by mouth daily with supper.          Today   CHIEF COMPLAINT:   doing well no hematuria +pain after working with PT knee   VITAL SIGNS:  Blood pressure 122/61, pulse 88, temperature 98.8 F (37.1 C), resp. rate 18, height 5\' 9"  (1.753 m), weight 81.6 kg, SpO2 96 %.   REVIEW OF SYSTEMS:  Review of Systems  Constitutional: Negative.  Negative for chills, fever and malaise/fatigue.  HENT: Negative.  Negative for ear discharge, ear pain, hearing loss, nosebleeds and sore throat.   Eyes: Negative.  Negative for blurred vision and pain.  Respiratory: Negative.  Negative for cough, hemoptysis, shortness of breath and wheezing.   Cardiovascular: Negative.  Negative for chest pain, palpitations and leg swelling.  Gastrointestinal: Negative.  Negative for abdominal pain, blood in stool, diarrhea, nausea and vomiting.  Genitourinary: Negative.  Negative for dysuria.  Musculoskeletal: Negative.  Negative for back pain.  Skin: Negative.   Neurological: Negative for dizziness, tremors, speech change, focal weakness, seizures and headaches.  Endo/Heme/Allergies: Negative.  Does not bruise/bleed easily.  Psychiatric/Behavioral: Negative.  Negative for depression, hallucinations and suicidal ideas.     PHYSICAL EXAMINATION:  GENERAL:  68 y.o.-year-old patient lying in the bed with no acute distress.  NECK:  Supple, no jugular venous distention. No thyroid enlargement, no tenderness.  LUNGS: Normal breath sounds bilaterally, no wheezing, rales,rhonchi  No use of accessory muscles of respiration.  CARDIOVASCULAR: S1, S2 normal. No murmurs, rubs, or gallops.  ABDOMEN: Soft, non-tender, non-distended. Bowel sounds present. No organomegaly or mass.  EXTREMITIES:no erythema, no warmth.  1+LEE Motor and sensory intact distally. PSYCHIATRIC: The patient is alert and oriented x 3.  SKIN: No obvious rash, lesion, or ulcer.   DATA REVIEW:   CBC Recent Labs  Lab 10/30/18 0453  WBC 8.2  HGB 9.4*  HCT 28.2*  PLT 269     Chemistries  Recent Labs  Lab 10/28/18 2116 10/30/18 0453  NA 136 142  K 3.3* 4.3  CL 101 110  CO2 21* 23  GLUCOSE 148* 124*  BUN 21 16  CREATININE 1.13 0.90  CALCIUM 8.9 8.4*  MG  --  1.8  AST 34  --   ALT 28  --   ALKPHOS 47  --   BILITOT 1.3*  --     Cardiac Enzymes No results for input(s): TROPONINI in the last 168 hours.  Microbiology Results  @[email or web address] @  RADIOLOGY:  Dg Chest 2 View  Result Date: 10/29/2018 CLINICAL DATA:  Postop fever EXAM: CHEST - 2 VIEW COMPARISON:  June 01, 2010 FINDINGS: The heart size and mediastinal contours are within normal limits. Elevation of the right hemidiaphragm. Both lungs are clear. No acute osseous abnormality. IMPRESSION: No active cardiopulmonary disease. Electronically Signed   By: Prudencio Pair M.D.   On: 10/29/2018 00:25   Mr Lumbar Spine W Wo Contrast  Result Date: 10/29/2018 CLINICAL DATA:  Lower back pain EXAM: MRI LUMBAR SPINE WITHOUT AND WITH CONTRAST TECHNIQUE: Multiplanar and multiecho pulse sequences of the lumbar spine were obtained without and with intravenous contrast. CONTRAST:  8 mL Gadavist COMPARISON:  None. FINDINGS: Segmentation: There are 5 non-rib bearing lumbar type vertebral bodies. There is however a rudimentary disc seen at S1-S2 and the last inter disc space is labeled as S1-S2. Alignment:  Normal Vertebrae: The vertebral body heights are well maintained. No fracture, marrow edema,or pathologic marrow infiltration. There is mildly increased STIR signal with endplate reactive changes at L5-S1. Small anterior osteophytes seen throughout the lumbar spine. There is slightly heterogeneous appearance to the marrow. Conus medullaris and cauda equina: Conus extends to the L1 level. Conus and cauda equina appear normal. No abnormal intramedullary or leptomeningeal enhancement is seen. Paraspinal and other soft tissues: The paraspinal soft tissues and visualized retroperitoneal structures are unremarkable. The  sacroiliac joints are intact. There is mildly enhancing paraspinal soft tissues at L3-L4 and inter spinous soft tissues at L3-L4. No enhancing loculated fluid collection. Disc levels: T12-L1:  No significant canal or neural foraminal narrowing. L1-L2:   No significant canal or neural foraminal narrowing. L2-L3: There is a minimal broad-based disc bulge, however no significant canal or neural foraminal narrowing. L3-L4: There is a broad-based disc bulge with facet arthrosis which causes mild bilateral neural foraminal narrowing L4-L5: There is a broad-based disc bulge with facet arthrosis which causes mild bilateral neural foraminal narrowing. L5-S1: There is a broad-based disc bulge with facet arthrosis and a central disc protrusion which contacts the bilateral descending S1 nerve roots. There severe bilateral neural foraminal narrowing. IMPRESSION: 1. For the purposes of this dictation the last inter disc space is labeled as S1-S2. 2. 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 3. Mild enhancing soft tissue edema seen within the paraspinal soft tissues and intraspinous ligaments at L4-L5, likely from recent injection. Electronically Signed   By: Prudencio Pair M.D.   On: 10/29/2018 00:16   Ct Abdomen Pelvis W Contrast  Result Date: 10/29/2018 CLINICAL DATA:  Acute abdominal pain with frequency and hematuria EXAM: CT ABDOMEN AND PELVIS WITH CONTRAST TECHNIQUE: Multidetector CT imaging of the abdomen and pelvis was performed using the standard protocol following bolus administration of intravenous contrast. CONTRAST:  145mL OMNIPAQUE IOHEXOL 300 MG/ML  SOLN COMPARISON:  05/23/2010 FINDINGS: Lower chest: Bibasilar atelectasis. Hepatobiliary: Low-attenuation of the liver as can be seen with hepatic steatosis. No focal hepatic mass. Mildly distended gallbladder. No other focal abnormality. Pancreas: Unremarkable. No pancreatic ductal  dilatation or surrounding inflammatory changes. Spleen: Normal in size without focal abnormality. Adrenals/Urinary Tract: Adrenal glands are unremarkable. Kidneys are normal, without renal calculi, focal lesion, or hydronephrosis. Evaluation of bladder pathology is limited given that the bladder is decompressed with a Foley catheter present. Stomach/Bowel: Stomach is within normal limits. No evidence of bowel wall thickening, distention, or inflammatory changes. Prior partial sigmoid resection. Vascular/Lymphatic: Normal caliber abdominal aorta with mild atherosclerosis. No lymphadenopathy. Reproductive: Prostate is unremarkable. Other: No abdominal wall hernia or abnormality. No abdominopelvic ascites. Musculoskeletal: No acute osseous abnormality. No aggressive osseous lesion. Degenerative disease with disc height loss at L4-5 with bilateral facet arthropathy and foraminal narrowing. IMPRESSION: 1. No acute abdominal or pelvic pathology. 2. Hepatic steatosis. 3. Evaluation of bladder pathology is limited given that the bladder is decompressed with a Foley catheter present. Electronically Signed   By: Elbert Ewings  Patel   On: 10/29/2018 05:51   US Venous Img Lower Unilateral Left  Result Date: 10/28/2018 CLINICAL DATA:  Left lower extremity pain and swelling EXAM: LEFT LOWER EXTREMITY VENOUS DOPPLER ULTRASOUND TECHNIQUE: Gray-scale sonography with graded compression, as well as color Doppler and duplex ultrasound were performed to evaluate the lower extremity deep venous systems from the level of the common femoral vein and including the common femoral, femoral, profunda femoral, popliteal and calf veins including the posterior tibial, peroneal and gastrocnemius veins when visible. The superficial great saphenous vein was also interrogated. Spectral Doppler was utilized to evaluate flow at rest and with distal augmentation maneuvers in the common femoral, femoral and popliteal veins. COMPARISON:  None. FINDINGS:  Contralateral Common Femoral Vein: Respiratory phasicity is normal and symmetric with the symptomatic side. No evidence of thrombus. Normal compressibility. Common Femoral Vein: No evidence of thrombus. Normal compressibility, respiratory phasicity and response to augmentation. Saphenofemoral Junction: No evidence of thrombus. Normal compressibility and flow on color Doppler imaging. Profunda Femoral Vein: No evidence of thrombus. Normal compressibility and flow on color Doppler imaging. Femoral Vein: No evidence of thrombus. Normal compressibility, respiratory phasicity and response to augmentation. Popliteal Vein: No evidence of thrombus. Normal compressibility, respiratory phasicity and response to augmentation. Calf Veins: Somewhat suboptimal due to overlying subcutaneous edema, however no definite thrombus. Superficial Great Saphenous Vein: No evidence of thrombus. Normal compressibility. Venous Reflux:  None. Other Findings:  None. IMPRESSION: No evidence of deep venous thrombosis. Electronically Signed   By: Prudencio Pair M.D.   On: 10/28/2018 20:55      Allergies as of 10/30/2018   No Known Allergies     Medication List    STOP taking these medications   aspirin EC 81 MG tablet   meloxicam 15 MG tablet Commonly known as: MOBIC   oxyCODONE 5 MG immediate release tablet Commonly known as: Oxy IR/ROXICODONE     TAKE these medications   amoxicillin-clavulanate 875-125 MG tablet Commonly known as: Augmentin Take 1 tablet by mouth 2 (two) times daily.   DULoxetine 20 MG capsule Commonly known as: CYMBALTA Take 40 mg by mouth daily.   gabapentin 800 MG tablet Commonly known as: NEURONTIN Take 1 tablet (800 mg total) by mouth 3 (three) times daily.   gemfibrozil 600 MG tablet Commonly known as: LOPID Take 1 tablet (600 mg total) by mouth 2 (two) times daily before a meal.   glimepiride 1 MG tablet Commonly known as: Amaryl Take one tab po qd with lunch What changed:   how much  to take  how to take this  when to take this  additional instructions   GlucoCom Blood Glucose Monitor Devi FREESTYLE LITE METER.  use as directed to check blood sugars   glucose blood test strip 1 each by Other route daily. Use once a daily to check blood sugar (free style test strips )   Lancets 28G Misc by Does not apply route daily.   lisinopril 20 MG tablet Commonly known as: ZESTRIL Take one tab po qd What changed:   how much to take  how to take this  when to take this   metoprolol tartrate 50 MG tablet Commonly known as: LOPRESSOR Take 1 tablet (50 mg total) by mouth 2 (two) times daily.   omeprazole 20 MG capsule Commonly known as: PRILOSEC Take 1 capsule (20 mg total) by mouth daily.   oxyCODONE-acetaminophen 7.5-325 MG tablet Commonly known as: Percocet Take 1 tablet by mouth every 4 (four) hours  as needed for severe pain.   tamsulosin 0.4 MG Caps capsule Commonly known as: Flomax Take 1 capsule (0.4 mg total) by mouth daily after supper.   Xarelto 10 MG Tabs tablet Generic drug: rivaroxaban Take 10 mg by mouth daily with supper.         Management plans discussed with the patient and wife and they are in agreement. Stable for discharge home   Patient should follow up with ortho  CODE STATUS:     Code Status Orders  (From admission, onward)         Start     Ordered   10/29/18 0837  Full code  Continuous     10/29/18 0836        Code Status History    This patient has a current code status but no historical code status.   Advance Care Planning Activity      TOTAL TIME TAKING CARE OF THIS PATIENT: 38 minutes.    Note: This dictation was prepared with Dragon dictation along with smaller phrase technology. Any transcriptional errors that result from this process are unintentional.  Bettey Costa M.D on 10/30/2018 at 11:04 AM  Between 7am to 6pm - Pager - [phone] After 6pm go to [email or web address] - password [email or web address] [facility]  Office  [phone]  CC: Primary care physician; [name], MD

## 2018-10-30 NOTE — Consult Note (Signed)
PHARMACY CONSULT NOTE - FOLLOW UP  Pharmacy Consult for Electrolyte Monitoring and Replacement   Recent Labs: Potassium (mmol/L)  Date Value  10/30/2018 4.3   Magnesium (mg/dL)  Date Value  10/30/2018 1.8   Calcium (mg/dL)  Date Value  10/30/2018 8.4 (L)   Albumin (g/dL)  Date Value  10/28/2018 3.4 (L)  05/29/2017 4.3   Phosphorus (mg/dL)  Date Value  10/30/2018 2.9   Sodium (mmol/L)  Date Value  10/30/2018 142  10/15/2018 141     Assessment: Pharmacy has been consulted to monitor and replace Electrolytes in 68yo patient with possible cellulitis.   Goal of Therapy:  Electrolytes WNL  Plan:  K 4.3. Patient currently is receiving NS+KCl 12mEq continuous infusion at 193ml/hr.  Will discuss with hospitalist about discontinuing.  Recheck K tomorrow with AM labs. Corrected Ca 8.9.  [place], PharmD Pharmacy Resident  10/30/2018 7:37 AM

## 2018-10-30 NOTE — TOC Transition Note (Signed)
Transition of Care [facility]) - CM/SW Discharge Note   Patient Details  Name: Shawn Greene MRN: [id number] Date of Birth: September 08, 1950  [facility] CM/SW Contact:  Su Hilt, RN Phone Number: 10/30/2018, 11:59 AM   Clinical Narrative:    Patient had Knee surgery on Friday and has Outpatient Physical Therapy already set up and arranged.  No additional needs         Patient Goals and CMS Choice        Discharge Placement                       Discharge Plan and Services                                     Social Determinants of Health (SDOH) Interventions     Readmission Risk Interventions No flowsheet data found.

## 2018-10-30 NOTE — Progress Notes (Signed)
   10/30/18 1200  Clinical Encounter Type  Visited With Patient and family together  Visit Type Follow-up;Spiritual support  Referral From Nurse  Spiritual Encounters  Spiritual Needs Prayer;Emotional  Ch went in for prayer and AD education. Pt's wife Santiago Glad was at bedside. Pt and wife shared about their 55 years of marriage and how they've there for each other all this time. Pt also shared his past week's experience of having gone through knee replacement surgery and rehospitalization. Ch prayed for healing and recovery and offered AD education for the pt. Pt is expecting to go home today. The visit was appreciated.

## 2018-10-30 NOTE — Progress Notes (Signed)
Family Meeting Note  Advance Directive:no  Today a meeting took place with the Patient.  The following clinical team members were present during this meeting:MD  The following were discussed:Patient's diagnosis: knee pain POD #4 surgery , Patient's progosis: > 12 months and Goals for treatment: Full Code  Additional follow-up to be provided: chaplain consult to create AD  Time spent during discussion:16 minutes  Bettey Costa, MD

## 2018-11-01 DIAGNOSIS — M25562 Pain in left knee: Secondary | ICD-10-CM | POA: Diagnosis not present

## 2018-11-01 DIAGNOSIS — M25662 Stiffness of left knee, not elsewhere classified: Secondary | ICD-10-CM | POA: Diagnosis not present

## 2018-11-03 LAB — CULTURE, BLOOD (ROUTINE X 2)
Culture: NO GROWTH
Culture: NO GROWTH

## 2018-11-04 DIAGNOSIS — M25562 Pain in left knee: Secondary | ICD-10-CM | POA: Diagnosis not present

## 2018-11-04 DIAGNOSIS — M25662 Stiffness of left knee, not elsewhere classified: Secondary | ICD-10-CM | POA: Diagnosis not present

## 2018-11-04 DIAGNOSIS — Z96652 Presence of left artificial knee joint: Secondary | ICD-10-CM | POA: Insufficient documentation

## 2018-11-05 ENCOUNTER — Ambulatory Visit: Payer: Self-pay | Admitting: Nurse Practitioner

## 2018-11-06 ENCOUNTER — Other Ambulatory Visit: Payer: Self-pay

## 2018-11-06 ENCOUNTER — Ambulatory Visit (INDEPENDENT_AMBULATORY_CARE_PROVIDER_SITE_OTHER): Payer: PPO | Admitting: Adult Health

## 2018-11-06 ENCOUNTER — Encounter: Payer: Self-pay | Admitting: Adult Health

## 2018-11-06 VITALS — BP 126/76 | HR 84 | Temp 97.2°F | Resp 16 | Ht 69.0 in | Wt 178.0 lb

## 2018-11-06 DIAGNOSIS — L0390 Cellulitis, unspecified: Secondary | ICD-10-CM

## 2018-11-06 DIAGNOSIS — A419 Sepsis, unspecified organism: Secondary | ICD-10-CM

## 2018-11-06 DIAGNOSIS — M25562 Pain in left knee: Secondary | ICD-10-CM | POA: Diagnosis not present

## 2018-11-06 DIAGNOSIS — E1165 Type 2 diabetes mellitus with hyperglycemia: Secondary | ICD-10-CM | POA: Diagnosis not present

## 2018-11-06 NOTE — Progress Notes (Signed)
[facility] [place], [place] [address]  Internal MEDICINE  Office Visit Note  Patient Name: Shawn Greene  [id number]  [id number]  Date of Service: 11/06/2018     Chief Complaint  Patient presents with  . [facility] follow up post operative swelling and redness in left knee, cellulitis ,sepsis      HPI Pt is here for recent hospital follow up. PT reports he was having some left knee pain on post op day 3 from knee replacement surgery.  He went to the ED, and was eventually kept for infection/sepsis. He was on antibiotics and they kept him for 2 nights.  He was sent home and since then, has been doing well.  He continues to have pain, and issues sleeping from the surgery, but is taking his oxycodone, and doing his best. He is doing physical therapy.     Current Medication: Outpatient Encounter Medications as of 11/06/2018  Medication Sig  . amoxicillin-clavulanate (AUGMENTIN) 875-125 MG tablet Take 1 tablet by mouth 2 (two) times daily.  . Blood Glucose Monitoring Suppl (GLUCOCOM BLOOD GLUCOSE MONITOR) DEVI FREESTYLE LITE METER.  use as directed to check blood sugars  . DULoxetine (CYMBALTA) 20 MG capsule Take 40 mg by mouth daily.   Marland Kitchen gabapentin (NEURONTIN) 800 MG tablet Take 1 tablet (800 mg total) by mouth 3 (three) times daily.  Marland Kitchen gemfibrozil (LOPID) 600 MG tablet Take 1 tablet (600 mg total) by mouth 2 (two) times daily before a meal.  . glimepiride (AMARYL) 1 MG tablet Take one tab po qd with lunch (Patient taking differently: Take 1 mg by mouth daily with supper. )  . glucose blood test strip 1 each by Other route daily. Use once a daily to check blood sugar (free style test strips )  . Lancets 28G MISC by Does not apply route daily.  Marland Kitchen lisinopril (PRINIVIL,ZESTRIL) 20 MG tablet Take one tab po qd (Patient taking differently: Take 20 mg by mouth daily. Take one tab po qd)  . metoprolol tartrate (LOPRESSOR) 50 MG  tablet Take 1 tablet (50 mg total) by mouth 2 (two) times daily.  Marland Kitchen omeprazole (PRILOSEC) 20 MG capsule Take 1 capsule (20 mg total) by mouth daily.  Marland Kitchen oxyCODONE-acetaminophen (PERCOCET) 7.5-325 MG tablet Take 1 tablet by mouth every 4 (four) hours as needed for severe pain.  . tamsulosin (FLOMAX) 0.4 MG CAPS capsule Take 1 capsule (0.4 mg total) by mouth daily after supper.  Alveda Reasons 10 MG TABS tablet Take 10 mg by mouth daily with supper.    No facility-administered encounter medications on file as of 11/06/2018.     Surgical History: Past Surgical History:  Procedure Laterality Date  . ANKLE SURGERY    . COLECTOMY  2012    Medical History: Past Medical History:  Diagnosis Date  . Chronic tension-type headache, not intractable   . Essential (primary) hypertension   . Mixed hyperlipidemia   . Psoriasis   . Type 2 diabetes, controlled, with neuropathy ([place])   . 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 Hypertension Mother     Social History   Socioeconomic History  . Marital status: Married    Spouse name: Not on file  . Number of children: Not on file  . Years of education: Not on file  . Highest education level: Not on file  Occupational History  . Not on file  Social Needs  . Financial resource strain: Not on file  . Food insecurity    Worry: Not on file    Inability: Not on file  . Transportation needs    Medical: Not on file    Non-medical: Not on file  Tobacco Use  . Smoking status: Never Smoker  . Smokeless tobacco: Never Used  Substance and Sexual Activity  . Alcohol use: Yes    Alcohol/week: 1.0 standard drinks    Types: 1 Cans of beer per week    Comment: has not had any in 3 months drinks rarely  . Drug use: No  . Sexual activity: Not on file  Lifestyle  . Physical activity    Days per week: Not on file    Minutes per session: Not on file  . Stress: Not on file   Relationships  . Social Herbalist on phone: Not on file    Gets together: Not on file    Attends religious service: Not on file    Active member of club or organization: Not on file    Attends meetings of clubs or organizations: Not on file    Relationship status: Not on file  . Intimate partner violence    Fear of current or ex partner: Not on file    Emotionally abused: Not on file    Physically abused: Not on file    Forced sexual activity: Not on file  Other Topics Concern  . Not on file  Social History Narrative  . Not on file      Review of Systems  Constitutional: Negative.  Negative for chills, fatigue and unexpected weight change.  HENT: Negative.  Negative for congestion, rhinorrhea, sneezing and sore throat.   Eyes: Negative for redness.  Respiratory: Negative.  Negative for cough, chest tightness and shortness of breath.   Cardiovascular: Negative.  Negative for chest pain and palpitations.  Gastrointestinal: Negative.  Negative for abdominal pain, constipation, diarrhea, nausea and vomiting.  Endocrine: Negative.   Genitourinary: Negative.  Negative for dysuria and frequency.  Musculoskeletal: Negative.  Negative for arthralgias, back pain, joint swelling and neck pain.  Skin: Negative.  Negative for rash.  Allergic/Immunologic: Negative.   Neurological: Negative.  Negative for tremors and numbness.  Hematological: Negative for adenopathy. Does not bruise/bleed easily.  Psychiatric/Behavioral: Negative.  Negative for behavioral problems, sleep disturbance and suicidal ideas. The patient is not nervous/anxious.     Vital Signs: BP 126/76   Pulse 84   Temp (!) 97.2 F (36.2 C)   Resp 16   Ht 5\' 9"  (1.753 m)   Wt 178 lb (80.7 kg)   SpO2 98%   BMI 26.29 kg/m    Physical Exam Vitals signs and nursing note reviewed.  Constitutional:      General: He is not in acute distress.    Appearance: He is well-developed. He is not diaphoretic.  HENT:      Head: Normocephalic and atraumatic.     Mouth/Throat:     Pharynx: No oropharyngeal exudate.  Eyes:     Pupils: Pupils are equal, round, and reactive to light.  Neck:     Musculoskeletal: Normal range of motion and neck supple.     Thyroid: No thyromegaly.     Vascular: No JVD.     Trachea: No tracheal deviation.  Cardiovascular:     Rate and Rhythm: Normal rate and regular rhythm.  Heart sounds: Normal heart sounds. No murmur. No friction rub. No gallop.   Pulmonary:     Effort: Pulmonary effort is normal. No respiratory distress.     Breath sounds: Normal breath sounds. No wheezing or rales.  Chest:     Chest wall: No tenderness.  Abdominal:     Palpations: Abdomen is soft.     Tenderness: There is no abdominal tenderness. There is no guarding.  Musculoskeletal: Normal range of motion.  Lymphadenopathy:     Cervical: No cervical adenopathy.  Skin:    General: Skin is warm and dry.  Neurological:     Mental Status: He is alert and oriented to person, place, and time.     Cranial Nerves: No cranial nerve deficit.  Psychiatric:        Behavior: Behavior normal.        Thought Content: Thought content normal.        Judgment: Judgment normal.     Assessment/Plan: 1. Sepsis due to cellulitis ([place]) Finish Augmentin as prescribed by hospital.  Continue to monitor.   2. Acute pain of left knee Post op pain remains, however is better.   3. Uncontrolled type 2 diabetes mellitus with hyperglycemia (HCC) Stable, continue present management.   General Counseling: perl klauser understanding of the findings of todays visit and agrees with plan of treatment. I have discussed any further diagnostic evaluation that may be needed or ordered today. We also reviewed his medications today. he has been encouraged to call the office with any questions or concerns that should arise related to todays visit.     No orders of the defined types were placed in this  encounter.     I have reviewed all medical records from hospital follow up including radiology reports and consults from other physicians. Appropriate follow up diagnostics will be scheduled as needed. Patient/ Family understands the plan of treatment.   Time spent 25 minutes.   Orson Gear AGNP-C Internal Medicine

## 2018-11-08 DIAGNOSIS — M25562 Pain in left knee: Secondary | ICD-10-CM | POA: Diagnosis not present

## 2018-11-08 DIAGNOSIS — M25662 Stiffness of left knee, not elsewhere classified: Secondary | ICD-10-CM | POA: Diagnosis not present

## 2018-11-12 DIAGNOSIS — M25562 Pain in left knee: Secondary | ICD-10-CM | POA: Diagnosis not present

## 2018-11-12 DIAGNOSIS — M25662 Stiffness of left knee, not elsewhere classified: Secondary | ICD-10-CM | POA: Diagnosis not present

## 2018-11-15 DIAGNOSIS — M25662 Stiffness of left knee, not elsewhere classified: Secondary | ICD-10-CM | POA: Diagnosis not present

## 2018-11-15 DIAGNOSIS — M25562 Pain in left knee: Secondary | ICD-10-CM | POA: Diagnosis not present

## 2018-11-15 DIAGNOSIS — Z96652 Presence of left artificial knee joint: Secondary | ICD-10-CM | POA: Diagnosis not present

## 2018-11-18 DIAGNOSIS — M25662 Stiffness of left knee, not elsewhere classified: Secondary | ICD-10-CM | POA: Diagnosis not present

## 2018-11-18 DIAGNOSIS — M25562 Pain in left knee: Secondary | ICD-10-CM | POA: Diagnosis not present

## 2018-11-22 DIAGNOSIS — M25662 Stiffness of left knee, not elsewhere classified: Secondary | ICD-10-CM | POA: Diagnosis not present

## 2018-11-22 DIAGNOSIS — M25562 Pain in left knee: Secondary | ICD-10-CM | POA: Diagnosis not present

## 2018-11-25 ENCOUNTER — Other Ambulatory Visit: Payer: Self-pay

## 2018-11-25 DIAGNOSIS — E1140 Type 2 diabetes mellitus with diabetic neuropathy, unspecified: Secondary | ICD-10-CM

## 2018-11-25 DIAGNOSIS — K219 Gastro-esophageal reflux disease without esophagitis: Secondary | ICD-10-CM

## 2018-11-25 MED ORDER — GABAPENTIN 800 MG PO TABS: 800.0000 mg | ORAL_TABLET | Freq: Three times a day (TID) | ORAL | 3 refills | Status: DC

## 2018-11-25 MED ORDER — OMEPRAZOLE 20 MG PO CPDR: 20.0000 mg | DELAYED_RELEASE_CAPSULE | Freq: Every day | ORAL | 3 refills | Status: DC

## 2018-11-29 DIAGNOSIS — M25562 Pain in left knee: Secondary | ICD-10-CM | POA: Diagnosis not present

## 2018-11-29 DIAGNOSIS — M25662 Stiffness of left knee, not elsewhere classified: Secondary | ICD-10-CM | POA: Diagnosis not present

## 2018-12-02 DIAGNOSIS — M25562 Pain in left knee: Secondary | ICD-10-CM | POA: Diagnosis not present

## 2018-12-02 DIAGNOSIS — M25662 Stiffness of left knee, not elsewhere classified: Secondary | ICD-10-CM | POA: Diagnosis not present

## 2018-12-05 DIAGNOSIS — Z96652 Presence of left artificial knee joint: Secondary | ICD-10-CM | POA: Diagnosis not present

## 2018-12-06 DIAGNOSIS — M25662 Stiffness of left knee, not elsewhere classified: Secondary | ICD-10-CM | POA: Diagnosis not present

## 2018-12-06 DIAGNOSIS — M25562 Pain in left knee: Secondary | ICD-10-CM | POA: Diagnosis not present

## 2018-12-09 DIAGNOSIS — M25662 Stiffness of left knee, not elsewhere classified: Secondary | ICD-10-CM | POA: Diagnosis not present

## 2018-12-09 DIAGNOSIS — M25562 Pain in left knee: Secondary | ICD-10-CM | POA: Diagnosis not present

## 2018-12-13 DIAGNOSIS — M25662 Stiffness of left knee, not elsewhere classified: Secondary | ICD-10-CM | POA: Diagnosis not present

## 2018-12-13 DIAGNOSIS — M25562 Pain in left knee: Secondary | ICD-10-CM | POA: Diagnosis not present

## 2018-12-16 DIAGNOSIS — M25662 Stiffness of left knee, not elsewhere classified: Secondary | ICD-10-CM | POA: Diagnosis not present

## 2018-12-16 DIAGNOSIS — M25562 Pain in left knee: Secondary | ICD-10-CM | POA: Diagnosis not present

## 2018-12-23 ENCOUNTER — Other Ambulatory Visit: Payer: Self-pay

## 2019-01-01 DIAGNOSIS — M25562 Pain in left knee: Secondary | ICD-10-CM | POA: Diagnosis not present

## 2019-01-01 DIAGNOSIS — M25662 Stiffness of left knee, not elsewhere classified: Secondary | ICD-10-CM | POA: Diagnosis not present

## 2019-01-03 ENCOUNTER — Telehealth: Payer: Self-pay

## 2019-01-03 NOTE — Telephone Encounter (Signed)
Called lmom informing patient of appointment. klh 

## 2019-01-07 ENCOUNTER — Other Ambulatory Visit: Payer: Self-pay

## 2019-01-07 ENCOUNTER — Ambulatory Visit (INDEPENDENT_AMBULATORY_CARE_PROVIDER_SITE_OTHER): Payer: PPO | Admitting: Adult Health

## 2019-01-07 ENCOUNTER — Encounter: Payer: Self-pay | Admitting: Adult Health

## 2019-01-07 VITALS — BP 135/73 | HR 76 | Temp 97.5°F | Resp 16 | Ht 69.0 in | Wt 181.8 lb

## 2019-01-07 DIAGNOSIS — I10 Essential (primary) hypertension: Secondary | ICD-10-CM

## 2019-01-07 DIAGNOSIS — Z0001 Encounter for general adult medical examination with abnormal findings: Secondary | ICD-10-CM

## 2019-01-07 DIAGNOSIS — R300 Dysuria: Secondary | ICD-10-CM | POA: Diagnosis not present

## 2019-01-07 DIAGNOSIS — K219 Gastro-esophageal reflux disease without esophagitis: Secondary | ICD-10-CM

## 2019-01-07 DIAGNOSIS — E1140 Type 2 diabetes mellitus with diabetic neuropathy, unspecified: Secondary | ICD-10-CM | POA: Diagnosis not present

## 2019-01-07 DIAGNOSIS — Z125 Encounter for screening for malignant neoplasm of prostate: Secondary | ICD-10-CM

## 2019-01-07 DIAGNOSIS — E781 Pure hyperglyceridemia: Secondary | ICD-10-CM | POA: Diagnosis not present

## 2019-01-07 DIAGNOSIS — N529 Male erectile dysfunction, unspecified: Secondary | ICD-10-CM | POA: Diagnosis not present

## 2019-01-07 DIAGNOSIS — N400 Enlarged prostate without lower urinary tract symptoms: Secondary | ICD-10-CM | POA: Diagnosis not present

## 2019-01-07 DIAGNOSIS — E1165 Type 2 diabetes mellitus with hyperglycemia: Secondary | ICD-10-CM | POA: Diagnosis not present

## 2019-01-07 LAB — POCT GLYCOSYLATED HEMOGLOBIN (HGB A1C): Hemoglobin A1C: 6.1 % — AB (ref 4.0–5.6)

## 2019-01-07 MED ORDER — GLIMEPIRIDE 1 MG PO TABS: 1.0000 mg | ORAL_TABLET | Freq: Every day | ORAL | 3 refills | Status: DC

## 2019-01-07 MED ORDER — TAMSULOSIN HCL 0.4 MG PO CAPS: 0.4000 mg | ORAL_CAPSULE | Freq: Every day | ORAL | 2 refills | Status: DC

## 2019-01-07 MED ORDER — GEMFIBROZIL 600 MG PO TABS: 600.0000 mg | ORAL_TABLET | Freq: Two times a day (BID) | ORAL | 6 refills | Status: DC

## 2019-01-07 MED ORDER — LISINOPRIL 20 MG PO TABS: 20.0000 mg | ORAL_TABLET | Freq: Every day | ORAL | 3 refills | Status: DC

## 2019-01-07 MED ORDER — METOPROLOL TARTRATE 50 MG PO TABS: 50.0000 mg | ORAL_TABLET | Freq: Two times a day (BID) | ORAL | 6 refills | Status: DC

## 2019-01-07 NOTE — Progress Notes (Signed)
[facility], [place] [address]  Internal MEDICINE  Office Visit Note  Patient Name: Shawn Greene  [id number]  [id number]  Date of Service: 01/20/2019  Chief Complaint  Patient presents with  . Annual Exam  . Diabetes  . Hypertension  . Hyperlipidemia  . Quality Metric Gaps    diabetic foot exam      HPI Pt is here for routine health maintenance examination.  He is a well appearing 68 yo male. Patient has a history of HTN, DM, BPH, and GERD.  Currently his blood pressure is well controlled. His A1C is 6.1 today, and appears well controlled. He denies any current issues. He is in need of Diabetic foot exam to close his quality metric gaps.        Current Medication: Outpatient Encounter Medications as of 01/07/2019  Medication Sig  . Blood Glucose Monitoring Suppl (GLUCOCOM BLOOD GLUCOSE MONITOR) DEVI FREESTYLE LITE METER.  use as directed to check blood sugars  . DULoxetine (CYMBALTA) 20 MG capsule Take 40 mg by mouth daily.   Marland Kitchen gabapentin (NEURONTIN) 800 MG tablet Take 1 tablet (800 mg total) by mouth 3 (three) times daily.  Marland Kitchen gemfibrozil (LOPID) 600 MG tablet Take 1 tablet (600 mg total) by mouth 2 (two) times daily before a meal.  . glimepiride (AMARYL) 1 MG tablet Take 1 tablet (1 mg total) by mouth daily with supper.  Marland Kitchen glucose blood test strip 1 each by Other route daily. Use once a daily to check blood sugar (free style test strips )  . Lancets 28G MISC by Does not apply route daily.  Marland Kitchen lisinopril (ZESTRIL) 20 MG tablet Take 1 tablet (20 mg total) by mouth daily. Take one tab po qd  . metoprolol tartrate (LOPRESSOR) 50 MG tablet Take 1 tablet (50 mg total) by mouth 2 (two) times daily.  Marland Kitchen omeprazole (PRILOSEC) 20 MG capsule Take 1 capsule (20 mg total) by mouth daily.  . tamsulosin (FLOMAX) 0.4 MG CAPS capsule Take 1 capsule (0.4 mg total) by mouth daily after supper.  . [DISCONTINUED] gemfibrozil (LOPID) 600 MG tablet Take 1 tablet (600  mg total) by mouth 2 (two) times daily before a meal.  . [DISCONTINUED] glimepiride (AMARYL) 1 MG tablet Take one tab po qd with lunch (Patient taking differently: Take 1 mg by mouth daily with supper. )  . [DISCONTINUED] lisinopril (PRINIVIL,ZESTRIL) 20 MG tablet Take one tab po qd (Patient taking differently: Take 20 mg by mouth daily. Take one tab po qd)  . [DISCONTINUED] metoprolol tartrate (LOPRESSOR) 50 MG tablet Take 1 tablet (50 mg total) by mouth 2 (two) times daily.  . [DISCONTINUED] tamsulosin (FLOMAX) 0.4 MG CAPS capsule Take 1 capsule (0.4 mg total) by mouth daily after supper.  Alveda Reasons 10 MG TABS tablet Take 10 mg by mouth daily with supper.   . [DISCONTINUED] amoxicillin-clavulanate (AUGMENTIN) 875-125 MG tablet Take 1 tablet by mouth 2 (two) times daily. (Patient not taking: Reported on 01/07/2019)  . [DISCONTINUED] oxyCODONE-acetaminophen (PERCOCET) 7.5-325 MG tablet Take 1 tablet by mouth every 4 (four) hours as needed for severe pain. (Patient not taking: Reported on 01/07/2019)   No facility-administered encounter medications on file as of 01/07/2019.     Surgical History: Past Surgical History:  Procedure Laterality Date  . ANKLE SURGERY    . COLECTOMY  2012    Medical History: Past Medical History:  Diagnosis Date  . Chronic tension-type headache, not intractable   . Essential (  primary) hypertension   . Mixed hyperlipidemia   . Psoriasis   . Type 2 diabetes, controlled, with neuropathy ([place])   . 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 Hypertension Mother      Review of Systems  Constitutional: Negative.  Negative for chills, fatigue and unexpected weight change.  HENT: Negative.  Negative for congestion, rhinorrhea, sneezing and sore throat.   Eyes: Negative for redness.  Respiratory: Negative.  Negative for cough, chest tightness and shortness of breath.   Cardiovascular: Negative.   Negative for chest pain and palpitations.  Gastrointestinal: Negative.  Negative for abdominal pain, constipation, diarrhea, nausea and vomiting.  Endocrine: Negative.   Genitourinary: Negative.  Negative for dysuria and frequency.  Musculoskeletal: Negative.  Negative for arthralgias, back pain, joint swelling and neck pain.  Skin: Negative.  Negative for rash.  Allergic/Immunologic: Negative.   Neurological: Negative.  Negative for tremors and numbness.  Hematological: Negative for adenopathy. Does not bruise/bleed easily.  Psychiatric/Behavioral: Negative.  Negative for behavioral problems, sleep disturbance and suicidal ideas. The patient is not nervous/anxious.      Vital Signs: BP 135/73   Pulse 76   Temp (!) 97.5 F (36.4 C)   Resp 16   Ht 5\' 9"  (1.753 m)   Wt 181 lb 12.8 oz (82.5 kg)   SpO2 98%   BMI 26.85 kg/m    Physical Exam Vitals signs and nursing note reviewed.  Constitutional:      General: He is not in acute distress.    Appearance: He is well-developed. He is not diaphoretic.  HENT:     Head: Normocephalic and atraumatic.     Mouth/Throat:     Pharynx: No oropharyngeal exudate.  Eyes:     Pupils: Pupils are equal, round, and reactive to light.  Neck:     Musculoskeletal: Normal range of motion and neck supple.     Thyroid: No thyromegaly.     Vascular: No JVD.     Trachea: No tracheal deviation.  Cardiovascular:     Rate and Rhythm: Normal rate and regular rhythm.     Heart sounds: Normal heart sounds. No murmur. No friction rub. No gallop.   Pulmonary:     Effort: Pulmonary effort is normal. No respiratory distress.     Breath sounds: Normal breath sounds. No wheezing or rales.  Chest:     Chest wall: No tenderness.  Abdominal:     Palpations: Abdomen is soft.  Musculoskeletal: Normal range of motion.  Lymphadenopathy:     Cervical: No cervical adenopathy.  Skin:    General: Skin is warm and dry.  Neurological:     Mental Status: He is  alert and oriented to person, place, and time.     Cranial Nerves: No cranial nerve deficit.  Psychiatric:        Behavior: Behavior normal.        Thought Content: Thought content normal.        Judgment: Judgment normal.      LABS: Recent Results (from the past 2160 hour(s))  Urinalysis, Complete w Microscopic     Status: Abnormal   Collection Time: 10/28/18  9:16 PM  Result Value Ref Range   Color, Urine YELLOW (A) YELLOW   APPearance CLEAR (A) CLEAR   Specific Gravity, Urine 1.008 1.005 - 1.030   pH 6.0 5.0 - 8.0   Glucose, UA NEGATIVE NEGATIVE mg/dL  Hgb urine dipstick NEGATIVE NEGATIVE   Bilirubin Urine NEGATIVE NEGATIVE   Ketones, ur NEGATIVE NEGATIVE mg/dL   Protein, ur NEGATIVE NEGATIVE mg/dL   Nitrite NEGATIVE NEGATIVE   Leukocytes,Ua NEGATIVE NEGATIVE   WBC, UA 0-5 0 - 5 WBC/hpf   Bacteria, UA NONE SEEN NONE SEEN   Squamous Epithelial / LPF NONE SEEN 0 - 5    Comment: Performed at [facility], [address] [place], [place] [address]  CBC     Status: Abnormal   Collection Time: 10/28/18  9:16 PM  Result Value Ref Range   WBC 13.4 (H) 4.0 - 10.5 K/uL   RBC 3.80 (L) 4.22 - 5.81 MIL/uL   Hemoglobin 11.5 (L) 13.0 - 17.0 g/dL   HCT 34.0 (L) 39.0 - 52.0 %   MCV 89.5 80.0 - 100.0 fL   MCH 30.3 26.0 - 34.0 pg   MCHC 33.8 30.0 - 36.0 g/dL   RDW 13.0 11.5 - 15.5 %   Platelets 275 150 - 400 K/uL   nRBC 0.0 0.0 - 0.2 %    Comment: Performed at [facility], [address] [place], [place] [address]  Lactic acid, plasma     Status: Abnormal   Collection Time: 10/28/18  9:16 PM  Result Value Ref Range   Lactic Acid, Venous 2.2 (HH) 0.5 - 1.9 mmol/L    Comment: CRITICAL RESULT CALLED TO, READ BACK BY AND VERIFIED WITH VANESSA  ASHLEY ON 10/28/2018 AT 2148 TIK Performed at [facility], [address] [place], [place] [address]   Comprehensive metabolic panel     Status: Abnormal   Collection Time: 10/28/18  9:16 PM  Result Value  Ref Range   Sodium 136 135 - 145 mmol/L   Potassium 3.3 (L) 3.5 - 5.1 mmol/L   Chloride 101 98 - 111 mmol/L   CO2 21 (L) 22 - 32 mmol/L   Glucose, Bld 148 (H) 70 - 99 mg/dL   BUN 21 8 - 23 mg/dL   Creatinine, Ser 1.13 0.61 - 1.24 mg/dL   Calcium 8.9 8.9 - 10.3 mg/dL   Total Protein 7.0 6.5 - 8.1 g/dL   Albumin 3.4 (L) 3.5 - 5.0 g/dL   AST 34 15 - 41 U/L   ALT 28 0 - 44 U/L   Alkaline Phosphatase 47 38 - 126 U/L   Total Bilirubin 1.3 (H) 0.3 - 1.2 mg/dL   GFR calc non Af Amer >60 >60 mL/min   GFR calc Af Amer >60 >60 mL/min   Anion gap 14 5 - 15    Comment: Performed at [facility], [address] [place], [place] [address]  TSH     Status: None   Collection Time: 10/28/18  9:16 PM  Result Value Ref Range   TSH 2.366 0.350 - 4.500 uIU/mL    Comment: Performed by a 3rd Generation assay with a functional sensitivity of <=0.01 uIU/mL. Performed at [facility], [address] [place], [place] [address]   Blood culture (routine x 2)     Status: None   Collection Time: 10/29/18 12:53 AM   Specimen: BLOOD  Result Value Ref Range   Specimen Description BLOOD BLOOD LEFT WRIST    Special Requests      BOTTLES DRAWN AEROBIC AND ANAEROBIC Blood Culture results may not be optimal due to an excessive volume of blood received in culture bottles   Culture      NO GROWTH 5 DAYS Performed at  [facility], [address] [place], [place] [address]    Report Status 11/03/2018 FINAL   Blood culture (routine x 2)     Status: None   Collection Time: 10/29/18 12:53 AM   Specimen: BLOOD  Result Value Ref Range   Specimen Description BLOOD BLOOD RIGHT WRIST    Special Requests      BOTTLES DRAWN AEROBIC AND ANAEROBIC Blood Culture results may not be optimal due to an excessive volume of blood received in culture bottles   Culture      NO GROWTH 5 DAYS Performed at [facility], [address] [place], [place] [address]    Report Status 11/03/2018  FINAL   SARS CORONAVIRUS 2 (TAT 6-24 HRS) Nasopharyngeal Nasopharyngeal Swab     Status: None   Collection Time: 10/29/18  7:06 AM   Specimen: Nasopharyngeal Swab  Result Value Ref Range   SARS Coronavirus 2 NEGATIVE NEGATIVE    Comment: (NOTE) SARS-CoV-2 target nucleic acids are NOT DETECTED. The SARS-CoV-2 RNA is generally detectable in upper and lower respiratory specimens during the acute phase of infection. Negative results do not preclude SARS-CoV-2 infection, do not rule out co-infections with other pathogens, and should not be used as the sole basis for treatment or other patient management decisions. Negative results must be combined with clinical observations, patient history, and epidemiological information. The expected result is Negative. Fact Sheet for Patients: SugarRoll.be Fact Sheet for Healthcare Providers: https://www.woods-mathews.com/ This test is not yet approved or cleared by the [place] FDA and  has been authorized for detection and/or diagnosis of SARS-CoV-2 by FDA under an Emergency Use Authorization (EUA). This EUA will remain  in effect (meaning this test can be used) for the duration of the COVID-19 declaration under Section 56 4(b)(1) of the Act, 21 U.S.C. section 360bbb-3(b)(1), unless the authorization is terminated or revoked sooner. Performed at [facility], [place] [address], [place], [place] [address]   Glucose, capillary     Status: Abnormal   Collection Time: 10/29/18  8:18 AM  Result Value Ref Range   Glucose-Capillary 128 (H) 70 - 99 mg/dL  Glucose, capillary     Status: Abnormal   Collection Time: 10/29/18 11:48 AM  Result Value Ref Range   Glucose-Capillary 166 (H) 70 - 99 mg/dL   Comment 1 Notify RN   Glucose, capillary     Status: None   Collection Time: 10/29/18  5:48 PM  Result Value Ref Range   Glucose-Capillary 97 70 - 99 mg/dL   Comment 1 Notify RN   Glucose, capillary      Status: Abnormal   Collection Time: 10/29/18  9:32 PM  Result Value Ref Range   Glucose-Capillary 122 (H) 70 - 99 mg/dL   Comment 1 Notify RN   Basic metabolic panel     Status: Abnormal   Collection Time: 10/30/18  4:53 AM  Result Value Ref Range   Sodium 142 135 - 145 mmol/L   Potassium 4.3 3.5 - 5.1 mmol/L   Chloride 110 98 - 111 mmol/L   CO2 23 22 - 32 mmol/L   Glucose, Bld 124 (H) 70 - 99 mg/dL   BUN 16 8 - 23 mg/dL   Creatinine, Ser 0.90 0.61 - 1.24 mg/dL   Calcium 8.4 (L) 8.9 - 10.3 mg/dL   GFR calc non Af Amer >60 >60 mL/min   GFR calc Af Amer >60 >60 mL/min   Anion gap 9 5 - 15  Comment: Performed at [facility], [address] [place], [place] [address]  CBC     Status: Abnormal   Collection Time: 10/30/18  4:53 AM  Result Value Ref Range   WBC 8.2 4.0 - 10.5 K/uL   RBC 3.13 (L) 4.22 - 5.81 MIL/uL   Hemoglobin 9.4 (L) 13.0 - 17.0 g/dL   HCT 28.2 (L) 39.0 - 52.0 %   MCV 90.1 80.0 - 100.0 fL   MCH 30.0 26.0 - 34.0 pg   MCHC 33.3 30.0 - 36.0 g/dL   RDW 13.2 11.5 - 15.5 %   Platelets 269 150 - 400 K/uL   nRBC 0.0 0.0 - 0.2 %    Comment: Performed at [facility], [address] [place], [place] [address]  Magnesium     Status: None   Collection Time: 10/30/18  4:53 AM  Result Value Ref Range   Magnesium 1.8 1.7 - 2.4 mg/dL    Comment: Performed at [facility], [address] [place], [place] [address]  Phosphorus     Status: None   Collection Time: 10/30/18  4:53 AM  Result Value Ref Range   Phosphorus 2.9 2.5 - 4.6 mg/dL    Comment: Performed at [facility], [address] [place], [place] [address]  Glucose, capillary     Status: Abnormal   Collection Time: 10/30/18  7:51 AM  Result Value Ref Range   Glucose-Capillary 110 (H) 70 - 99 mg/dL  Glucose, capillary     Status: Abnormal   Collection Time: 10/30/18 11:45 AM  Result Value Ref Range   Glucose-Capillary 162 (H) 70 - 99 mg/dL  UA/M w/rflx Culture,  Routine     Status: Abnormal   Collection Time: 01/07/19 12:00 AM   Specimen: Urine   URINE  Result Value Ref Range   Specific Gravity, UA 1.027 1.005 - 1.030   pH, UA 6.0 5.0 - 7.5   Color, UA Yellow Yellow   Appearance Ur Cloudy (A) Clear   Leukocytes,UA Negative Negative   Protein,UA Negative Negative/Trace   Glucose, UA 1+ (A) Negative   Ketones, UA Negative Negative   RBC, UA Negative Negative   Bilirubin, UA Negative Negative   Urobilinogen, Ur 0.2 0.2 - 1.0 mg/dL   Nitrite, UA Negative Negative   Microscopic Examination Comment     Comment: Microscopic follows if indicated.   Microscopic Examination See below:     Comment: Microscopic was indicated and was performed.   Urinalysis Reflex Comment     Comment: This specimen will not reflex to a Urine Culture.  Microscopic Examination     Status: Abnormal   Collection Time: 01/07/19 12:00 AM   URINE  Result Value Ref Range   WBC, UA 0-5 0 - 5 /hpf   RBC None seen 0 - 2 /hpf   Epithelial Cells (non renal) 0-10 0 - 10 /hpf   Casts Present (A) None seen /lpf   Cast Type Hyaline casts N/A   Mucus, UA Present Not Estab.   Bacteria, UA None seen None seen/Few  POCT HgB A1C     Status: Abnormal   Collection Time: 01/07/19 12:20 PM  Result Value Ref Range   Hemoglobin A1C 6.1 (A) 4.0 - 5.6 %   HbA1c POC (<> result, manual entry)     HbA1c, POC (prediabetic range)     HbA1c, POC (controlled diabetic range)       Assessment/Plan:  1. Encounter for general adult medical examination  with abnormal findings Up to date on PHM - TSH + free T4 - Lipid Panel With LDL/HDL Ratio - CBC w/Diff/Platelet - Comprehensive Metabolic Panel (CMET)  2. Essential hypertension, benign Continue current medications as ordered. - metoprolol tartrate (LOPRESSOR) 50 MG tablet; Take 1 tablet (50 mg total) by mouth 2 (two) times daily.  Dispense: 60 tablet; Refill: 6 - lisinopril (ZESTRIL) 20 MG tablet; Take 1 tablet (20 mg total) by mouth daily.  Take one tab po qd  Dispense: 30 tablet; Refill: 3  3. Benign prostatic hyperplasia without lower urinary tract symptoms Refilled Flomax as discussed.  - tamsulosin (FLOMAX) 0.4 MG CAPS capsule; Take 1 capsule (0.4 mg total) by mouth daily after supper.  Dispense: 30 capsule; Refill: 2  4. Gastroesophageal reflux disease without esophagitis Stable, continue current therapy.   5. Type 2 diabetes mellitus with diabetic neuropathy, without long-term current use of insulin (HCC) Continue present management.  - POCT HgB A1C - glimepiride (AMARYL) 1 MG tablet; Take 1 tablet (1 mg total) by mouth daily with supper.  Dispense: 30 tablet; Refill: 3  6. Pure hyperglyceridemia Continue Lopid as prescribed.  - gemfibrozil (LOPID) 600 MG tablet; Take 1 tablet (600 mg total) by mouth 2 (two) times daily before a meal.  Dispense: 60 tablet; Refill: 6  7. Erectile dysfunction, unspecified erectile dysfunction type Continue present management.  8. Screening for prostate cancer - PSA  9. Dysuria - UA/M w/rflx Culture, Routine - Microscopic Examination  General Counseling: rylei saah understanding of the findings of todays visit and agrees with plan of treatment. I have discussed any further diagnostic evaluation that may be needed or ordered today. We also reviewed his medications today. he has been encouraged to call the office with any questions or concerns that should arise related to todays visit.   Orders Placed This Encounter  Procedures  . Microscopic Examination  . UA/M w/rflx Culture, Routine  . TSH + free T4  . Lipid Panel With LDL/HDL Ratio  . PSA  . CBC w/Diff/Platelet  . Comprehensive Metabolic Panel (CMET)  . POCT HgB A1C    Meds ordered this encounter  Medications  . tamsulosin (FLOMAX) 0.4 MG CAPS capsule    Sig: Take 1 capsule (0.4 mg total) by mouth daily after supper.    Dispense:  30 capsule    Refill:  2  . metoprolol tartrate (LOPRESSOR) 50 MG tablet    Sig:  Take 1 tablet (50 mg total) by mouth 2 (two) times daily.    Dispense:  60 tablet    Refill:  6  . lisinopril (ZESTRIL) 20 MG tablet    Sig: Take 1 tablet (20 mg total) by mouth daily. Take one tab po qd    Dispense:  30 tablet    Refill:  3  . glimepiride (AMARYL) 1 MG tablet    Sig: Take 1 tablet (1 mg total) by mouth daily with supper.    Dispense:  30 tablet    Refill:  3  . gemfibrozil (LOPID) 600 MG tablet    Sig: Take 1 tablet (600 mg total) by mouth 2 (two) times daily before a meal.    Dispense:  60 tablet    Refill:  6    Time spent: 30 Minutes   This patient was seen by Orson Gear AGNP-C in Collaboration with Dr [name] as a part of collaborative care agreement    Kendell Bane AGNP-C Internal Medicine

## 2019-01-08 DIAGNOSIS — Z96652 Presence of left artificial knee joint: Secondary | ICD-10-CM | POA: Diagnosis not present

## 2019-01-08 DIAGNOSIS — M1711 Unilateral primary osteoarthritis, right knee: Secondary | ICD-10-CM | POA: Diagnosis not present

## 2019-01-08 LAB — MICROSCOPIC EXAMINATION
Bacteria, UA: NONE SEEN
RBC, Urine: NONE SEEN /hpf (ref 0–2)

## 2019-01-08 LAB — UA/M W/RFLX CULTURE, ROUTINE
Bilirubin, UA: NEGATIVE
Ketones, UA: NEGATIVE
Leukocytes,UA: NEGATIVE
Nitrite, UA: NEGATIVE
Protein,UA: NEGATIVE
RBC, UA: NEGATIVE
Specific Gravity, UA: 1.027 (ref 1.005–1.030)
Urobilinogen, Ur: 0.2 mg/dL (ref 0.2–1.0)
pH, UA: 6 (ref 5.0–7.5)

## 2019-01-29 DIAGNOSIS — Z0001 Encounter for general adult medical examination with abnormal findings: Secondary | ICD-10-CM | POA: Diagnosis not present

## 2019-01-29 DIAGNOSIS — Z125 Encounter for screening for malignant neoplasm of prostate: Secondary | ICD-10-CM | POA: Diagnosis not present

## 2019-01-30 LAB — CBC WITH DIFFERENTIAL/PLATELET
Basophils Absolute: 0.1 10*3/uL (ref 0.0–0.2)
Basos: 1 %
EOS (ABSOLUTE): 0.3 10*3/uL (ref 0.0–0.4)
Eos: 6 %
Hematocrit: 38.6 % (ref 37.5–51.0)
Hemoglobin: 13.6 g/dL (ref 13.0–17.7)
Immature Grans (Abs): 0 10*3/uL (ref 0.0–0.1)
Immature Granulocytes: 1 %
Lymphocytes Absolute: 2.2 10*3/uL (ref 0.7–3.1)
Lymphs: 40 %
MCH: 29.4 pg (ref 26.6–33.0)
MCHC: 35.2 g/dL (ref 31.5–35.7)
MCV: 84 fL (ref 79–97)
Monocytes Absolute: 0.8 10*3/uL (ref 0.1–0.9)
Monocytes: 15 %
Neutrophils Absolute: 2.1 10*3/uL (ref 1.4–7.0)
Neutrophils: 37 %
Platelets: 326 10*3/uL (ref 150–450)
RBC: 4.62 x10E6/uL (ref 4.14–5.80)
RDW: 12.9 % (ref 11.6–15.4)
WBC: 5.5 10*3/uL (ref 3.4–10.8)

## 2019-01-30 LAB — COMPREHENSIVE METABOLIC PANEL
ALT: 39 IU/L (ref 0–44)
AST: 34 IU/L (ref 0–40)
Albumin/Globulin Ratio: 1.7 (ref 1.2–2.2)
Albumin: 4.6 g/dL (ref 3.8–4.8)
Alkaline Phosphatase: 60 IU/L (ref 39–117)
BUN/Creatinine Ratio: 16 (ref 10–24)
BUN: 17 mg/dL (ref 8–27)
Bilirubin Total: 0.5 mg/dL (ref 0.0–1.2)
CO2: 23 mmol/L (ref 20–29)
Calcium: 9.9 mg/dL (ref 8.6–10.2)
Chloride: 103 mmol/L (ref 96–106)
Creatinine, Ser: 1.06 mg/dL (ref 0.76–1.27)
GFR calc Af Amer: 82 mL/min/{1.73_m2} (ref >59)
GFR calc non Af Amer: 71 mL/min/{1.73_m2} (ref >59)
Globulin, Total: 2.7 g/dL (ref 1.5–4.5)
Glucose: 142 mg/dL — ABNORMAL HIGH (ref 65–99)
Potassium: 4.8 mmol/L (ref 3.5–5.2)
Sodium: 141 mmol/L (ref 134–144)
Total Protein: 7.3 g/dL (ref 6.0–8.5)

## 2019-01-30 LAB — PSA: Prostate Specific Ag, Serum: 1.3 ng/mL (ref 0.0–4.0)

## 2019-01-30 LAB — LIPID PANEL WITH LDL/HDL RATIO
Cholesterol, Total: 175 mg/dL (ref 100–199)
HDL: 36 mg/dL — ABNORMAL LOW (ref >39)
LDL Chol Calc (NIH): 110 mg/dL — ABNORMAL HIGH (ref 0–99)
LDL/HDL Ratio: 3.1 ratio (ref 0.0–3.6)
Triglycerides: 162 mg/dL — ABNORMAL HIGH (ref 0–149)
VLDL Cholesterol Cal: 29 mg/dL (ref 5–40)

## 2019-01-30 LAB — TSH+FREE T4
Free T4: 1.17 ng/dL (ref 0.82–1.77)
TSH: 2.17 u[IU]/mL (ref 0.450–4.500)

## 2019-03-21 ENCOUNTER — Other Ambulatory Visit: Payer: Self-pay | Admitting: Adult Health

## 2019-03-21 DIAGNOSIS — E1140 Type 2 diabetes mellitus with diabetic neuropathy, unspecified: Secondary | ICD-10-CM

## 2019-03-21 DIAGNOSIS — K219 Gastro-esophageal reflux disease without esophagitis: Secondary | ICD-10-CM

## 2019-04-08 ENCOUNTER — Telehealth: Payer: Self-pay

## 2019-04-08 ENCOUNTER — Ambulatory Visit: Payer: PPO | Attending: Internal Medicine

## 2019-04-08 DIAGNOSIS — Z20822 Contact with and (suspected) exposure to covid-19: Secondary | ICD-10-CM

## 2019-04-08 NOTE — Telephone Encounter (Signed)
Confirmed appointment on 04/10/2019 and screened for covid. klh 

## 2019-04-09 ENCOUNTER — Ambulatory Visit: Payer: PPO

## 2019-04-09 LAB — NOVEL CORONAVIRUS, NAA: SARS-CoV-2, NAA: NOT DETECTED

## 2019-04-10 ENCOUNTER — Ambulatory Visit: Payer: PPO | Admitting: Adult Health

## 2019-04-14 ENCOUNTER — Telehealth: Payer: Self-pay

## 2019-04-14 NOTE — Telephone Encounter (Signed)
Called lmom/no vm informing patient of appointment. klh

## 2019-04-16 ENCOUNTER — Ambulatory Visit (INDEPENDENT_AMBULATORY_CARE_PROVIDER_SITE_OTHER): Payer: PPO | Admitting: Adult Health

## 2019-04-16 ENCOUNTER — Encounter: Payer: Self-pay | Admitting: Adult Health

## 2019-04-16 ENCOUNTER — Other Ambulatory Visit: Payer: Self-pay

## 2019-04-16 ENCOUNTER — Other Ambulatory Visit: Payer: Self-pay | Admitting: Adult Health

## 2019-04-16 VITALS — BP 146/72 | HR 102 | Temp 97.4°F | Resp 16 | Ht 69.0 in | Wt 186.4 lb

## 2019-04-16 DIAGNOSIS — E781 Pure hyperglyceridemia: Secondary | ICD-10-CM

## 2019-04-16 DIAGNOSIS — E11649 Type 2 diabetes mellitus with hypoglycemia without coma: Secondary | ICD-10-CM

## 2019-04-16 DIAGNOSIS — L819 Disorder of pigmentation, unspecified: Secondary | ICD-10-CM

## 2019-04-16 DIAGNOSIS — G729 Myopathy, unspecified: Secondary | ICD-10-CM

## 2019-04-16 DIAGNOSIS — E1140 Type 2 diabetes mellitus with diabetic neuropathy, unspecified: Secondary | ICD-10-CM

## 2019-04-16 DIAGNOSIS — I10 Essential (primary) hypertension: Secondary | ICD-10-CM | POA: Diagnosis not present

## 2019-04-16 DIAGNOSIS — I739 Peripheral vascular disease, unspecified: Secondary | ICD-10-CM

## 2019-04-16 DIAGNOSIS — K219 Gastro-esophageal reflux disease without esophagitis: Secondary | ICD-10-CM

## 2019-04-16 DIAGNOSIS — F320 Major depressive disorder, single episode, mild: Secondary | ICD-10-CM | POA: Diagnosis not present

## 2019-04-16 DIAGNOSIS — N400 Enlarged prostate without lower urinary tract symptoms: Secondary | ICD-10-CM | POA: Diagnosis not present

## 2019-04-16 LAB — POCT GLYCOSYLATED HEMOGLOBIN (HGB A1C): Hemoglobin A1C: 6.4 % — AB (ref 4.0–5.6)

## 2019-04-16 MED ORDER — METOPROLOL TARTRATE 50 MG PO TABS: 50.0000 mg | ORAL_TABLET | Freq: Two times a day (BID) | ORAL | 6 refills | Status: DC

## 2019-04-16 MED ORDER — OMEPRAZOLE 20 MG PO CPDR: 20.0000 mg | DELAYED_RELEASE_CAPSULE | Freq: Every day | ORAL | 3 refills | Status: DC

## 2019-04-16 MED ORDER — GABAPENTIN 800 MG PO TABS: 800.0000 mg | ORAL_TABLET | Freq: Three times a day (TID) | ORAL | 3 refills | Status: DC

## 2019-04-16 MED ORDER — LISINOPRIL 20 MG PO TABS: 20.0000 mg | ORAL_TABLET | Freq: Every day | ORAL | 3 refills | Status: DC

## 2019-04-16 MED ORDER — DULOXETINE HCL 20 MG PO CPEP: 40.0000 mg | ORAL_CAPSULE | Freq: Every day | ORAL | 3 refills | Status: DC

## 2019-04-16 MED ORDER — GEMFIBROZIL 600 MG PO TABS: 600.0000 mg | ORAL_TABLET | Freq: Two times a day (BID) | ORAL | 6 refills | Status: DC

## 2019-04-16 MED ORDER — CILOSTAZOL 50 MG PO TABS: 50.0000 mg | ORAL_TABLET | Freq: Two times a day (BID) | ORAL | 1 refills | Status: DC

## 2019-04-16 MED ORDER — GLIMEPIRIDE 1 MG PO TABS: 1.0000 mg | ORAL_TABLET | Freq: Every day | ORAL | 3 refills | Status: DC

## 2019-04-16 MED ORDER — TAMSULOSIN HCL 0.4 MG PO CAPS: 0.4000 mg | ORAL_CAPSULE | Freq: Every day | ORAL | 2 refills | Status: DC

## 2019-04-16 NOTE — Progress Notes (Signed)
[facility] [place], [place] [address]  Internal MEDICINE  Office Visit Note  Patient Name: Shawn Greene  [id number]  [id number]  Date of Service: 04/16/2019  Chief Complaint  Patient presents with  . Follow-up    check feet    HPI  Pt is here for follow up on DM. His A1C is 6.4 today.    He also has a history of HTN. His bp is slightly elevated today.   Also complaiing of discoloration of feet.  He is concerned that he may have burned them, due to his neuropathy he has difficulty feeling.    Current Medication: Outpatient Encounter Medications as of 04/16/2019  Medication Sig  . Blood Glucose Monitoring Suppl (GLUCOCOM BLOOD GLUCOSE MONITOR) DEVI FREESTYLE LITE METER.  use as directed to check blood sugars  . DULoxetine (CYMBALTA) 20 MG capsule Take 40 mg by mouth daily.   Marland Kitchen gabapentin (NEURONTIN) 800 MG tablet TAKE ONE TABLET BY MOUTH THREE TIMES A DAY  . gemfibrozil (LOPID) 600 MG tablet Take 1 tablet (600 mg total) by mouth 2 (two) times daily before a meal.  . glimepiride (AMARYL) 1 MG tablet Take 1 tablet (1 mg total) by mouth daily with supper.  Marland Kitchen glucose blood test strip 1 each by Other route daily. Use once a daily to check blood sugar (free style test strips )  . Lancets 28G MISC by Does not apply route daily.  Marland Kitchen lisinopril (ZESTRIL) 20 MG tablet Take 1 tablet (20 mg total) by mouth daily. Take one tab po qd  . metoprolol tartrate (LOPRESSOR) 50 MG tablet Take 1 tablet (50 mg total) by mouth 2 (two) times daily.  Marland Kitchen omeprazole (PRILOSEC) 20 MG capsule TAKE 1 CAPSULE BY MOUTH DAILY  . tamsulosin (FLOMAX) 0.4 MG CAPS capsule Take 1 capsule (0.4 mg total) by mouth daily after supper.  Alveda Reasons 10 MG TABS tablet Take 10 mg by mouth daily with supper.    No facility-administered encounter medications on file as of 04/16/2019.    Surgical History: Past Surgical History:  Procedure Laterality Date  . ANKLE SURGERY    . COLECTOMY  2012     Medical History: Past Medical History:  Diagnosis Date  . Chronic tension-type headache, not intractable   . Essential (primary) hypertension   . Mixed hyperlipidemia   . Psoriasis   . Type 2 diabetes, controlled, with neuropathy ([place])   . 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 Hypertension Mother     Social History   Socioeconomic History  . Marital status: Married    Spouse name: Not on file  . Number of children: Not on file  . Years of education: Not on file  . Highest education level: Not on file  Occupational History  . Not on file  Tobacco Use  . Smoking status: Never Smoker  . Smokeless tobacco: Never Used  Substance and Sexual Activity  . Alcohol use: Yes    Alcohol/week: 1.0 standard drinks    Types: 1 Cans of beer per week    Comment: has not had any in 3 months drinks rarely  . Drug use: No  . Sexual activity: Not on file  Other Topics Concern  . Not on file  Social History Narrative  . Not on file   Social Determinants of Health   Financial Resource Strain:   . Difficulty of Paying  Living Expenses: Not on file  Food Insecurity:   . Worried About Charity fundraiser in the Last Year: Not on file  . Ran Out of Food in the Last Year: Not on file  Transportation Needs:   . Lack of Transportation (Medical): Not on file  . Lack of Transportation (Non-Medical): Not on file  Physical Activity:   . Days of Exercise per Week: Not on file  . Minutes of Exercise per Session: Not on file  Stress:   . Feeling of Stress : Not on file  Social Connections:   . Frequency of Communication with Friends and Family: Not on file  . Frequency of Social Gatherings with Friends and Family: Not on file  . Attends Religious Services: Not on file  . Active Member of Clubs or Organizations: Not on file  . Attends Archivist Meetings: Not on file  . Marital Status: Not on file   Intimate Partner Violence:   . Fear of Current or Ex-Partner: Not on file  . Emotionally Abused: Not on file  . Physically Abused: Not on file  . Sexually Abused: Not on file      Review of Systems  Constitutional: Negative.  Negative for chills, fatigue and unexpected weight change.  HENT: Negative.  Negative for congestion, rhinorrhea, sneezing and sore throat.   Eyes: Negative for redness.  Respiratory: Negative.  Negative for cough, chest tightness and shortness of breath.   Cardiovascular: Negative.  Negative for chest pain and palpitations.  Gastrointestinal: Negative.  Negative for abdominal pain, constipation, diarrhea, nausea and vomiting.  Endocrine: Negative.   Genitourinary: Negative.  Negative for dysuria and frequency.  Musculoskeletal: Negative.  Negative for arthralgias, back pain, joint swelling and neck pain.  Skin: Negative.  Negative for rash.       Discoloration to bilateral feet  Allergic/Immunologic: Negative.   Neurological: Negative.  Negative for tremors and numbness.  Hematological: Negative for adenopathy. Does not bruise/bleed easily.  Psychiatric/Behavioral: Negative.  Negative for behavioral problems, sleep disturbance and suicidal ideas. The patient is not nervous/anxious.     Vital Signs: BP (!) 146/72   Pulse (!) 102   Temp (!) 97.4 F (36.3 C)   Resp 16   Ht 5\' 9"  (1.753 m)   Wt 186 lb 6.4 oz (84.6 kg)   SpO2 94%   BMI 27.53 kg/m    Physical Exam Vitals and nursing note reviewed.  Constitutional:      General: He is not in acute distress.    Appearance: He is well-developed. He is not diaphoretic.  HENT:     Head: Normocephalic and atraumatic.     Mouth/Throat:     Pharynx: No oropharyngeal exudate.  Eyes:     Pupils: Pupils are equal, round, and reactive to light.  Neck:     Thyroid: No thyromegaly.     Vascular: No JVD.     Trachea: No tracheal deviation.  Cardiovascular:     Rate and Rhythm: Normal rate and regular  rhythm.     Heart sounds: Normal heart sounds. No murmur. No friction rub. No gallop.   Pulmonary:     Effort: Pulmonary effort is normal. No respiratory distress.     Breath sounds: Normal breath sounds. No wheezing or rales.  Chest:     Chest wall: No tenderness.  Abdominal:     Palpations: Abdomen is soft.     Tenderness: There is no abdominal tenderness. There is no guarding.  Musculoskeletal:  General: Normal range of motion.     Cervical back: Normal range of motion and neck supple.  Lymphadenopathy:     Cervical: No cervical adenopathy.  Skin:    General: Skin is warm and dry.     Comments: Red/purple, smooth discoloration to bilateral feet.   Neurological:     Mental Status: He is alert and oriented to person, place, and time.     Cranial Nerves: No cranial nerve deficit.  Psychiatric:        Behavior: Behavior normal.        Thought Content: Thought content normal.        Judgment: Judgment normal.        Assessment/Plan: 1. Type 2 diabetes mellitus with diabetic neuropathy, without long-term current use of insulin (HCC) A1C is 6.4, continue current medications. Continue to treat Neuropathy with gabapentin. - glimepiride (AMARYL) 1 MG tablet; Take 1 tablet (1 mg total) by mouth daily with supper.  Dispense: 30 tablet; Refill: 3 - gabapentin (NEURONTIN) 800 MG tablet; Take 1 tablet (800 mg total) by mouth 3 (three) times daily.  Dispense: 90 tablet; Refill: 3  2. Essential hypertension, benign Controled, continue to monitor. - metoprolol tartrate (LOPRESSOR) 50 MG tablet; Take 1 tablet (50 mg total) by mouth 2 (two) times daily.  Dispense: 60 tablet; Refill: 6 - lisinopril (ZESTRIL) 20 MG tablet; Take 1 tablet (20 mg total) by mouth daily. Take one tab po qd  Dispense: 30 tablet; Refill: 3  3. Gastroesophageal reflux disease without esophagitis Refilled prilosec, continue to monitor. - omeprazole (PRILOSEC) 20 MG capsule; Take 1 capsule (20 mg total) by mouth  daily.  Dispense: 30 capsule; Refill: 3  4. Benign prostatic hyperplasia without lower urinary tract symptoms Controlled, continue Flomax - tamsulosin (FLOMAX) 0.4 MG CAPS capsule; Take 1 capsule (0.4 mg total) by mouth daily after supper.  Dispense: 30 capsule; Refill: 2  5. Pure hyperglyceridemia Continue to monitor Lipid panel. Continue lopid. - gemfibrozil (LOPID) 600 MG tablet; Take 1 tablet (600 mg total) by mouth 2 (two) times daily before a meal.  Dispense: 60 tablet; Refill: 6  6. Depression, major, single episode, mild (HCC) Controlled, continue current medications.   7. Discoloration of skin of foot - POCT ABI Screening Pilot No Charge  8. PVD (peripheral vascular disease) (HCC) - VAS US AORTA/IVC/ILIACS; Future - cilostazol (PLETAL) 50 MG tablet; Take 1 tablet (50 mg total) by mouth 2 (two) times daily.  Dispense: 60 tablet; Refill: 1  9. 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  Forgo statin use at this time.   General Counseling: kanoa lamke understanding of the findings of todays visit and agrees with plan of treatment. I have discussed any further diagnostic evaluation that may be needed or ordered today. We also reviewed his medications today. he has been encouraged to call the office with any questions or concerns that should arise related to todays visit.    Orders Placed This Encounter  Procedures  . POCT HgB A1C    No orders of the defined types were placed in this encounter.   Time spent:30 Minutes   This patient was seen by Orson Gear AGNP-C in Collaboration with Dr [name] as a part of collaborative care agreement     Kendell Bane AGNP-C Internal medicine

## 2019-04-18 ENCOUNTER — Other Ambulatory Visit: Payer: Self-pay

## 2019-04-18 ENCOUNTER — Ambulatory Visit: Payer: PPO

## 2019-04-18 DIAGNOSIS — L819 Disorder of pigmentation, unspecified: Secondary | ICD-10-CM

## 2019-04-25 ENCOUNTER — Other Ambulatory Visit: Payer: Self-pay

## 2019-04-25 ENCOUNTER — Ambulatory Visit: Payer: PPO

## 2019-04-25 DIAGNOSIS — I739 Peripheral vascular disease, unspecified: Secondary | ICD-10-CM | POA: Diagnosis not present

## 2019-04-29 ENCOUNTER — Telehealth: Payer: Self-pay

## 2019-04-29 NOTE — Telephone Encounter (Signed)
Called lmom informing patient of appointment on 05/01/2019. klh

## 2019-05-01 ENCOUNTER — Encounter: Payer: Self-pay | Admitting: Adult Health

## 2019-05-01 ENCOUNTER — Ambulatory Visit (INDEPENDENT_AMBULATORY_CARE_PROVIDER_SITE_OTHER): Payer: PPO | Admitting: Adult Health

## 2019-05-01 ENCOUNTER — Other Ambulatory Visit: Payer: Self-pay

## 2019-05-01 VITALS — BP 149/73 | HR 100 | Temp 97.5°F | Resp 16 | Ht 69.0 in | Wt 187.0 lb

## 2019-05-01 DIAGNOSIS — I10 Essential (primary) hypertension: Secondary | ICD-10-CM | POA: Diagnosis not present

## 2019-05-01 DIAGNOSIS — K219 Gastro-esophageal reflux disease without esophagitis: Secondary | ICD-10-CM | POA: Diagnosis not present

## 2019-05-01 DIAGNOSIS — I739 Peripheral vascular disease, unspecified: Secondary | ICD-10-CM

## 2019-05-01 DIAGNOSIS — L819 Disorder of pigmentation, unspecified: Secondary | ICD-10-CM | POA: Diagnosis not present

## 2019-05-01 NOTE — Progress Notes (Signed)
[facility] [place], [place] [address]  Internal MEDICINE  Office Visit Note  Patient Name: Shawn Greene  [id number]  [id number]  Date of Service: 05/01/2019  Chief Complaint  Patient presents with  . Diabetes  . Hyperlipidemia  . Hypertension    HPI  Pt is here for follow up on Vascular Ultrasound. He has bilateral lower extremity US.  As well as aortic US.  There was no evidence of insufficiently or aneurysm noted. He was started on Pletal and is doing very well. The discoloration of his feet is resolving, and he does not have any complaints at this time.  His bp was slightly elevated today, which is normal for him in the office.  149/73.  He Denies Chest pain, Shortness of breath, palpitations, headache, or blurred vision.    Current Medication: Outpatient Encounter Medications as of 05/01/2019  Medication Sig  . Blood Glucose Monitoring Suppl (GLUCOCOM BLOOD GLUCOSE MONITOR) DEVI FREESTYLE LITE METER.  use as directed to check blood sugars  . cilostazol (PLETAL) 50 MG tablet Take 1 tablet (50 mg total) by mouth 2 (two) times daily.  . DULoxetine (CYMBALTA) 20 MG capsule Take 2 capsules (40 mg total) by mouth daily.  Marland Kitchen gabapentin (NEURONTIN) 800 MG tablet Take 1 tablet (800 mg total) by mouth 3 (three) times daily.  Marland Kitchen gemfibrozil (LOPID) 600 MG tablet Take 1 tablet (600 mg total) by mouth 2 (two) times daily before a meal.  . glimepiride (AMARYL) 1 MG tablet Take 1 tablet (1 mg total) by mouth daily with supper.  Marland Kitchen glucose blood test strip 1 each by Other route daily. Use once a daily to check blood sugar (free style test strips )  . Lancets 28G MISC by Does not apply route daily.  Marland Kitchen lisinopril (ZESTRIL) 20 MG tablet Take 1 tablet (20 mg total) by mouth daily. Take one tab po qd  . metoprolol tartrate (LOPRESSOR) 50 MG tablet Take 1 tablet (50 mg total) by mouth 2 (two) times daily.  Marland Kitchen omeprazole (PRILOSEC) 20 MG capsule Take 1 capsule (20 mg total) by  mouth daily.  . tamsulosin (FLOMAX) 0.4 MG CAPS capsule Take 1 capsule (0.4 mg total) by mouth daily after supper.   No facility-administered encounter medications on file as of 05/01/2019.    Surgical History: Past Surgical History:  Procedure Laterality Date  . ANKLE SURGERY    . COLECTOMY  2012    Medical History: Past Medical History:  Diagnosis Date  . Chronic tension-type headache, not intractable   . Essential (primary) hypertension   . Mixed hyperlipidemia   . Psoriasis   . Type 2 diabetes, controlled, with neuropathy ([place])   . 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 Hypertension Mother     Social History   Socioeconomic History  . Marital status: Married    Spouse name: Not on file  . Number of children: Not on file  . Years of education: Not on file  . Highest education level: Not on file  Occupational History  . Not on file  Tobacco Use  . Smoking status: Never Smoker  . Smokeless tobacco: Never Used  Substance and Sexual Activity  . Alcohol use: Yes    Alcohol/week: 1.0 standard drinks    Types: 1 Cans of beer per week    Comment: has not had any in 3 months drinks rarely  .  Drug use: No  . Sexual activity: Not on file  Other Topics Concern  . Not on file  Social History Narrative  . Not on file   Social Determinants of Health   Financial Resource Strain:   . Difficulty of Paying Living Expenses:   Food Insecurity:   . Worried About Charity fundraiser in the Last Year:   . Arboriculturist in the Last Year:   Transportation Needs:   . Film/video editor (Medical):   Marland Kitchen Lack of Transportation (Non-Medical):   Physical Activity:   . Days of Exercise per Week:   . Minutes of Exercise per Session:   Stress:   . Feeling of Stress :   Social Connections:   . Frequency of Communication with Friends and Family:   . Frequency of Social Gatherings with Friends and Family:    . Attends Religious Services:   . Active Member of Clubs or Organizations:   . Attends Archivist Meetings:   Marland Kitchen Marital Status:   Intimate Partner Violence:   . Fear of Current or Ex-Partner:   . Emotionally Abused:   Marland Kitchen Physically Abused:   . Sexually Abused:       Review of Systems  Constitutional: Negative.  Negative for chills, fatigue and unexpected weight change.  HENT: Negative.  Negative for congestion, rhinorrhea, sneezing and sore throat.   Eyes: Negative for redness.  Respiratory: Negative.  Negative for cough, chest tightness and shortness of breath.   Cardiovascular: Negative.  Negative for chest pain and palpitations.  Gastrointestinal: Negative.  Negative for abdominal pain, constipation, diarrhea, nausea and vomiting.  Endocrine: Negative.   Genitourinary: Negative.  Negative for dysuria and frequency.  Musculoskeletal: Negative.  Negative for arthralgias, back pain, joint swelling and neck pain.  Skin: Negative.  Negative for rash.  Allergic/Immunologic: Negative.   Neurological: Negative.  Negative for tremors and numbness.  Hematological: Negative for adenopathy. Does not bruise/bleed easily.  Psychiatric/Behavioral: Negative.  Negative for behavioral problems, sleep disturbance and suicidal ideas. The patient is not nervous/anxious.     Vital Signs: BP (!) 149/73   Pulse 100   Temp (!) 97.5 F (36.4 C)   Resp 16   Ht 5\' 9"  (1.753 m)   Wt 187 lb (84.8 kg)   SpO2 97%   BMI 27.62 kg/m    Physical Exam Vitals and nursing note reviewed.  Constitutional:      General: He is not in acute distress.    Appearance: He is well-developed. He is not diaphoretic.  HENT:     Head: Normocephalic and atraumatic.     Mouth/Throat:     Pharynx: No oropharyngeal exudate.  Eyes:     Pupils: Pupils are equal, round, and reactive to light.  Neck:     Thyroid: No thyromegaly.     Vascular: No JVD.     Trachea: No tracheal deviation.  Cardiovascular:      Rate and Rhythm: Normal rate and regular rhythm.     Heart sounds: Normal heart sounds. No murmur. No friction rub. No gallop.   Pulmonary:     Effort: Pulmonary effort is normal. No respiratory distress.     Breath sounds: Normal breath sounds. No wheezing or rales.  Chest:     Chest wall: No tenderness.  Abdominal:     Palpations: Abdomen is soft.     Tenderness: There is no abdominal tenderness. There is no guarding.  Musculoskeletal:  General: Normal range of motion.     Cervical back: Normal range of motion and neck supple.  Lymphadenopathy:     Cervical: No cervical adenopathy.  Skin:    General: Skin is warm and dry.  Neurological:     Mental Status: He is alert and oriented to person, place, and time.     Cranial Nerves: No cranial nerve deficit.  Psychiatric:        Behavior: Behavior normal.        Thought Content: Thought content normal.        Judgment: Judgment normal.    Assessment/Plan: 1. Essential hypertension, benign Stable, continue present management.  2. Discoloration of skin of foot Resolving, continue pletal as prescribed.   3. Gastroesophageal reflux disease without esophagitis Stable, continue medication as directed.   4. PVD (peripheral vascular disease) ([place]) Continue pletal, continue to exercise as discussed.   General Counseling: angelia mordhorst understanding of the findings of todays visit and agrees with plan of treatment. I have discussed any further diagnostic evaluation that may be needed or ordered today. We also reviewed his medications today. he has been encouraged to call the office with any questions or concerns that should arise related to todays visit.    No orders of the defined types were placed in this encounter.   No orders of the defined types were placed in this encounter.   Time spent: 25 Minutes   This patient was seen by Orson Gear AGNP-C in Collaboration with Dr [name] as a part of collaborative  care agreement     Kendell Bane AGNP-C Internal medicine

## 2019-05-08 ENCOUNTER — Other Ambulatory Visit: Payer: Self-pay

## 2019-05-08 ENCOUNTER — Encounter: Payer: Self-pay | Admitting: Nurse Practitioner

## 2019-05-08 ENCOUNTER — Ambulatory Visit (INDEPENDENT_AMBULATORY_CARE_PROVIDER_SITE_OTHER): Payer: PPO | Admitting: Nurse Practitioner

## 2019-05-08 VITALS — BP 126/66 | HR 95 | Temp 97.7°F | Resp 16 | Ht 69.0 in | Wt 189.0 lb

## 2019-05-08 DIAGNOSIS — H6690 Otitis media, unspecified, unspecified ear: Secondary | ICD-10-CM | POA: Diagnosis not present

## 2019-05-08 MED ORDER — CIPROFLOXACIN HCL 0.2 % OT SOLN: 0.2000 mL | Freq: Two times a day (BID) | OTIC | 0 refills | Status: DC

## 2019-05-08 MED ORDER — AMOXICILLIN 875 MG PO TABS: 875.0000 mg | ORAL_TABLET | Freq: Two times a day (BID) | ORAL | 0 refills | Status: DC

## 2019-05-08 NOTE — Telephone Encounter (Signed)
[facility] ciprofloxacin  Ear drops is not covered as per heather its ok to change to ciprofloxacin 0.3 with same directions

## 2019-05-08 NOTE — Progress Notes (Signed)
[facility] [place], [place] [address]  Internal MEDICINE  Office Visit Note  Patient Name: Shawn Greene  [id number]  [id number]  Date of Service: 05/17/2019   Pt is here for a sick visit.  Chief Complaint  Patient presents with  . Ear Pain    right ear pain, pain radiating down to right jaw line, started yesterday morning, tried to use something to wash out ear wax and nothing came out      The patient is here for sick visit. He has been having right ear pain. This started yesterday. The pain has gradually become worse. It is radiating into the right side of his jaw and into his neck. He has no nasal or sinus congestion. He denies fever or headache. He has noted some swollen lymph nodes in the right side of the neck. He has tried to use OTC ear wash. He feels like this has just made the problem worse.        Current Medication:  Outpatient Encounter Medications as of 05/08/2019  Medication Sig  . Blood Glucose Monitoring Suppl (GLUCOCOM BLOOD GLUCOSE MONITOR) DEVI FREESTYLE LITE METER.  use as directed to check blood sugars  . cilostazol (PLETAL) 50 MG tablet Take 1 tablet (50 mg total) by mouth 2 (two) times daily.  . DULoxetine (CYMBALTA) 20 MG capsule Take 2 capsules (40 mg total) by mouth daily.  Marland Kitchen gabapentin (NEURONTIN) 800 MG tablet Take 1 tablet (800 mg total) by mouth 3 (three) times daily.  Marland Kitchen gemfibrozil (LOPID) 600 MG tablet Take 1 tablet (600 mg total) by mouth 2 (two) times daily before a meal.  . glimepiride (AMARYL) 1 MG tablet Take 1 tablet (1 mg total) by mouth daily with supper.  Marland Kitchen glucose blood test strip 1 each by Other route daily. Use once a daily to check blood sugar (free style test strips )  . Lancets 28G MISC by Does not apply route daily.  Marland Kitchen lisinopril (ZESTRIL) 20 MG tablet Take 1 tablet (20 mg total) by mouth daily. Take one tab po qd  . metoprolol tartrate (LOPRESSOR) 50 MG tablet Take 1 tablet (50 mg total) by mouth 2  (two) times daily.  Marland Kitchen omeprazole (PRILOSEC) 20 MG capsule Take 1 capsule (20 mg total) by mouth daily.  . tamsulosin (FLOMAX) 0.4 MG CAPS capsule Take 1 capsule (0.4 mg total) by mouth daily after supper.  Marland Kitchen amoxicillin (AMOXIL) 875 MG tablet Take 1 tablet (875 mg total) by mouth 2 (two) times daily.  . Ciprofloxacin HCl 0.2 % otic solution Place 0.2 mLs into both ears 2 (two) times daily. (Patient not taking: Reported on 05/08/2019)   No facility-administered encounter medications on file as of 05/08/2019.      Medical History: Past Medical History:  Diagnosis Date  . Chronic tension-type headache, not intractable   . Essential (primary) hypertension   . Mixed hyperlipidemia   . Psoriasis   . Type 2 diabetes, controlled, with neuropathy ([place])   . 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  Review of Systems  Constitutional: Negative for activity change, chills, fatigue and unexpected weight change.  HENT: Positive for ear pain. Negative for congestion, postnasal drip, rhinorrhea, sneezing  and sore throat.   Respiratory: Negative for cough, chest tightness and shortness of breath.   Cardiovascular: Negative for chest pain and palpitations.  Gastrointestinal: Negative for abdominal pain, constipation, diarrhea, nausea and vomiting.  Musculoskeletal: Negative for arthralgias, back pain, joint swelling and neck pain.  Skin: Negative for rash.  Allergic/Immunologic: Negative for environmental allergies.  Neurological: Negative for dizziness, tremors, numbness and headaches.  Hematological: Positive for adenopathy. Does not bruise/bleed easily.  Psychiatric/Behavioral: Negative for behavioral problems (Depression), sleep disturbance and suicidal ideas. The patient is not nervous/anxious.      Physical Exam Vitals and nursing note reviewed.  Constitutional:      General: He is not in acute distress.    Appearance: Normal appearance. He is well-developed. He is not diaphoretic.  HENT:     Head: Normocephalic and atraumatic.     Right Ear: Tenderness present. Tympanic membrane is erythematous and bulging.     Left Ear: Tenderness present. Tympanic membrane is bulging.     Nose: Nose normal.     Mouth/Throat:     Pharynx: No oropharyngeal exudate.  Eyes:     Pupils: Pupils are equal, round, and reactive to light.  Neck:     Thyroid: No thyromegaly.     Vascular: No JVD.     Trachea: No tracheal deviation.     Comments: Cervical adenopathy right side of neck.  Cardiovascular:     Rate and Rhythm: Normal rate and regular rhythm.     Heart sounds: Normal heart sounds. No murmur. No friction rub. No gallop.   Pulmonary:     Effort: Pulmonary effort is normal. No respiratory distress.     Breath sounds: Normal breath sounds. No wheezing or rales.  Chest:     Chest wall: No tenderness.  Abdominal:     Palpations: Abdomen is soft.  Musculoskeletal:        General: Normal range of motion.     Cervical back: Normal range of motion and neck supple.  Lymphadenopathy:     Cervical: Cervical adenopathy present.  Skin:    General: Skin is warm and dry.  Neurological:     Mental Status: He is alert and oriented to person, place, and time.     Cranial Nerves: No cranial nerve deficit.  Psychiatric:        Behavior: Behavior normal.        Thought Content: Thought content normal.        Judgment: Judgment normal.    Assessment/Plan: 1. Acute otitis media, unspecified otitis media type Start amoxicillin 875mg  twice daily for 10 days. Will also add cipro ear drops. Insert 4 drops in both ears twice daily. Take tylenol/ibuprofen for pain/inflammation. Rest and increase fluids.  - amoxicillin (AMOXIL) 875 MG tablet; Take 1 tablet (875 mg total) by mouth 2 (two) times daily.   Dispense: 20 tablet; Refill: 0 - Ciprofloxacin HCl 0.2 % otic solution; Place 0.2 mLs into both ears 2 (two) times daily. (Patient not taking: Reported on 05/08/2019)  Dispense: 14 vial; Refill: 0  General Counseling: Shawn Greene understanding of the findings of todays visit and agrees with plan of treatment. I have discussed any further diagnostic evaluation that may be needed or ordered today. We also reviewed his medications today. he has been encouraged to call the office with any questions or concerns that should arise related to todays visit.    Counseling:   This patient was seen by [place] with Dr Timoteo Gaul  Humphrey Rolls as a part of collaborative care agreement  Meds ordered this encounter  Medications  . amoxicillin (AMOXIL) 875 MG tablet    Sig: Take 1 tablet (875 mg total) by mouth 2 (two) times daily.    Dispense:  20 tablet    Refill:  0    Order Specific Question:   Supervising Provider    Answer:   [name] [id number]  . Ciprofloxacin HCl 0.2 % otic solution    Sig: Place 0.2 mLs into both ears 2 (two) times daily.    Dispense:  14 vial    Refill:  0    Ok to change to ophthalmic solution if insurance prefers.    Order Specific Question:   Supervising Provider    Answer:   [name] [id number]    Time spent: 20 Minutes

## 2019-05-15 DIAGNOSIS — Z96652 Presence of left artificial knee joint: Secondary | ICD-10-CM | POA: Diagnosis not present

## 2019-05-15 DIAGNOSIS — M170 Bilateral primary osteoarthritis of knee: Secondary | ICD-10-CM | POA: Diagnosis not present

## 2019-05-20 DIAGNOSIS — H6123 Impacted cerumen, bilateral: Secondary | ICD-10-CM | POA: Diagnosis not present

## 2019-05-20 DIAGNOSIS — H9201 Otalgia, right ear: Secondary | ICD-10-CM | POA: Diagnosis not present

## 2019-05-20 DIAGNOSIS — H93293 Other abnormal auditory perceptions, bilateral: Secondary | ICD-10-CM | POA: Diagnosis not present

## 2019-06-11 ENCOUNTER — Encounter: Payer: PPO | Admitting: Nurse Practitioner

## 2019-06-12 ENCOUNTER — Encounter: Payer: PPO | Admitting: Nurse Practitioner

## 2019-08-10 ENCOUNTER — Other Ambulatory Visit: Payer: Self-pay | Admitting: Adult Health

## 2019-08-28 ENCOUNTER — Telehealth: Payer: Self-pay

## 2019-08-28 NOTE — Telephone Encounter (Signed)
Confirmed appointment on 09/01/2019 and screened for covid. [facility] 

## 2019-09-01 ENCOUNTER — Other Ambulatory Visit: Payer: Self-pay

## 2019-09-01 ENCOUNTER — Ambulatory Visit (INDEPENDENT_AMBULATORY_CARE_PROVIDER_SITE_OTHER): Payer: PPO | Admitting: Adult Health

## 2019-09-01 ENCOUNTER — Encounter: Payer: Self-pay | Admitting: Adult Health

## 2019-09-01 VITALS — BP 130/63 | HR 92 | Temp 97.8°F | Resp 16 | Ht 69.0 in | Wt 186.8 lb

## 2019-09-01 DIAGNOSIS — E782 Mixed hyperlipidemia: Secondary | ICD-10-CM | POA: Diagnosis not present

## 2019-09-01 DIAGNOSIS — E11649 Type 2 diabetes mellitus with hypoglycemia without coma: Secondary | ICD-10-CM

## 2019-09-01 DIAGNOSIS — K219 Gastro-esophageal reflux disease without esophagitis: Secondary | ICD-10-CM | POA: Diagnosis not present

## 2019-09-01 DIAGNOSIS — L819 Disorder of pigmentation, unspecified: Secondary | ICD-10-CM | POA: Diagnosis not present

## 2019-09-01 DIAGNOSIS — I10 Essential (primary) hypertension: Secondary | ICD-10-CM | POA: Diagnosis not present

## 2019-09-01 DIAGNOSIS — N529 Male erectile dysfunction, unspecified: Secondary | ICD-10-CM

## 2019-09-01 DIAGNOSIS — I739 Peripheral vascular disease, unspecified: Secondary | ICD-10-CM | POA: Diagnosis not present

## 2019-09-01 LAB — POCT GLYCOSYLATED HEMOGLOBIN (HGB A1C): Hemoglobin A1C: 7.2 % — AB (ref 4.0–5.6)

## 2019-09-01 MED ORDER — ACCU-CHEK GUIDE VI STRP: ORAL_STRIP | 12 refills | Status: DC

## 2019-09-01 MED ORDER — TADALAFIL 5 MG PO TABS: 5.0000 mg | ORAL_TABLET | Freq: Every day | ORAL | 0 refills | Status: DC | PRN

## 2019-09-01 MED ORDER — GLIMEPIRIDE 2 MG PO TABS: 2.0000 mg | ORAL_TABLET | Freq: Every day | ORAL | 2 refills | Status: DC

## 2019-09-01 NOTE — Progress Notes (Signed)
[facility] [place], [place] [address]  Internal MEDICINE  Office Visit Note  Patient Name: Shawn Greene  [id number]  [id number]  Date of Service: 09/01/2019  Chief Complaint  Patient presents with  . Follow-up  . Diabetes  . Hyperlipidemia  . Hypertension    HPI  Patient is here for follow up on DM, HTN, HLD, and claudication of lower extremities.  Overall he is at his baseline.  His A1C today is elevated at 7.2. He does not have a meter that is working to check his sugar.  He denies any issues.      Current Medication: Outpatient Encounter Medications as of 09/01/2019  Medication Sig  . Blood Glucose Monitoring Suppl (GLUCOCOM BLOOD GLUCOSE MONITOR) DEVI FREESTYLE LITE METER.  use as directed to check blood sugars  . cilostazol (PLETAL) 50 MG tablet Take 1 tablet (50 mg total) by mouth 2 (two) times daily.  . ciprofloxacin (CILOXAN) 0.3 % ophthalmic ointment Place 0.3 ml in both ears 2 times daily  . DULoxetine (CYMBALTA) 20 MG capsule Take 2 capsules (40 mg total) by mouth daily.  Marland Kitchen gabapentin (NEURONTIN) 800 MG tablet Take 1 tablet (800 mg total) by mouth 3 (three) times daily.  Marland Kitchen gemfibrozil (LOPID) 600 MG tablet Take 1 tablet (600 mg total) by mouth 2 (two) times daily before a meal.  . glimepiride (AMARYL) 1 MG tablet Take 1 tablet (1 mg total) by mouth daily with supper.  Marland Kitchen glucose blood test strip 1 each by Other route daily. Use once a daily to check blood sugar (free style test strips )  . Lancets 28G MISC by Does not apply route daily.  Marland Kitchen lisinopril (ZESTRIL) 20 MG tablet Take 1 tablet (20 mg total) by mouth daily. Take one tab po qd  . metoprolol tartrate (LOPRESSOR) 50 MG tablet Take 1 tablet (50 mg total) by mouth 2 (two) times daily.  Marland Kitchen omeprazole (PRILOSEC) 20 MG capsule Take 1 capsule (20 mg total) by mouth daily.  . tadalafil (CIALIS) 5 MG tablet TAKE ONE TABLET BY MOUTH DAILY AS NEEDED  . tamsulosin (FLOMAX) 0.4 MG CAPS capsule Take 1  capsule (0.4 mg total) by mouth daily after supper.  . [DISCONTINUED] amoxicillin (AMOXIL) 875 MG tablet Take 1 tablet (875 mg total) by mouth 2 (two) times daily.  . [DISCONTINUED] Ciprofloxacin HCl 0.2 % otic solution Place 0.2 mLs into both ears 2 (two) times daily. (Patient not taking: Reported on 05/08/2019)   No facility-administered encounter medications on file as of 09/01/2019.    Surgical History: Past Surgical History:  Procedure Laterality Date  . ANKLE SURGERY    . COLECTOMY  2012    Medical History: Past Medical History:  Diagnosis Date  . Chronic tension-type headache, not intractable   . Essential (primary) hypertension   . Mixed hyperlipidemia   . Psoriasis   . Type 2 diabetes, controlled, with neuropathy ([place])   . 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 Hypertension Mother     Social History   Socioeconomic History  . Marital status: Married    Spouse name: Not on file  . Number of children: Not on file  . Years of education: Not on file  . Highest education level: Not on file  Occupational History  . Not on file  Tobacco Use  . Smoking status: Never Smoker  . Smokeless tobacco: Never Used  Substance and Sexual Activity  . Alcohol use: Yes    Alcohol/week: 1.0 standard drink    Types: 1 Cans of beer per week    Comment: occ  . Drug use: No  . Sexual activity: Not on file  Other Topics Concern  . Not on file  Social History Narrative  . Not on file   Social Determinants of Health   Financial Resource Strain:   . Difficulty of Paying Living Expenses:   Food Insecurity:   . Worried About Charity fundraiser in the Last Year:   . Arboriculturist in the Last Year:   Transportation Needs:   . Film/video editor (Medical):   Marland Kitchen Lack of Transportation (Non-Medical):   Physical Activity:   . Days of Exercise per Week:   . Minutes of Exercise per Session:   Stress:   .  Feeling of Stress :   Social Connections:   . Frequency of Communication with Friends and Family:   . Frequency of Social Gatherings with Friends and Family:   . Attends Religious Services:   . Active Member of Clubs or Organizations:   . Attends Archivist Meetings:   Marland Kitchen Marital Status:   Intimate Partner Violence:   . Fear of Current or Ex-Partner:   . Emotionally Abused:   Marland Kitchen Physically Abused:   . Sexually Abused:       Review of Systems  Constitutional: Negative.  Negative for chills, fatigue and unexpected weight change.  HENT: Negative.  Negative for congestion, rhinorrhea, sneezing and sore throat.   Eyes: Negative for redness.  Respiratory: Negative.  Negative for cough, chest tightness and shortness of breath.   Cardiovascular: Negative.  Negative for chest pain and palpitations.  Gastrointestinal: Negative.  Negative for abdominal pain, constipation, diarrhea, nausea and vomiting.  Endocrine: Negative.   Genitourinary: Negative.  Negative for dysuria and frequency.  Musculoskeletal: Negative.  Negative for arthralgias, back pain, joint swelling and neck pain.  Skin: Negative.  Negative for rash.  Allergic/Immunologic: Negative.   Neurological: Negative.  Negative for tremors and numbness.  Hematological: Negative for adenopathy. Does not bruise/bleed easily.  Psychiatric/Behavioral: Negative.  Negative for behavioral problems, sleep disturbance and suicidal ideas. The patient is not nervous/anxious.     Vital Signs: BP 130/63   Pulse 92   Temp 97.8 F (36.6 C)   Resp 16   Ht 5\' 9"  (1.753 m)   Wt 186 lb 12.8 oz (84.7 kg)   SpO2 95%   BMI 27.59 kg/m    Physical Exam Vitals and nursing note reviewed.  Constitutional:      General: He is not in acute distress.    Appearance: He is well-developed. He is not diaphoretic.  HENT:     Head: Normocephalic and atraumatic.     Mouth/Throat:     Pharynx: No oropharyngeal exudate.  Eyes:     Pupils:  Pupils are equal, round, and reactive to light.  Neck:     Thyroid: No thyromegaly.     Vascular: No JVD.     Trachea: No tracheal deviation.  Cardiovascular:     Rate and Rhythm: Normal rate and regular rhythm.     Heart sounds: Normal heart sounds. No murmur heard.  No friction rub. No gallop.   Pulmonary:     Effort: Pulmonary effort is normal. No respiratory distress.     Breath sounds: Normal breath sounds. No wheezing or rales.  Chest:  Chest wall: No tenderness.  Abdominal:     Palpations: Abdomen is soft.     Tenderness: There is no abdominal tenderness. There is no guarding.  Musculoskeletal:        General: Normal range of motion.     Cervical back: Normal range of motion and neck supple.  Lymphadenopathy:     Cervical: No cervical adenopathy.  Skin:    General: Skin is warm and dry.  Neurological:     Mental Status: He is alert and oriented to person, place, and time.     Cranial Nerves: No cranial nerve deficit.  Psychiatric:        Behavior: Behavior normal.        Thought Content: Thought content normal.        Judgment: Judgment normal.     Assessment/Plan: 1. Uncontrolled type 2 diabetes mellitus with hypoglycemia, unspecified hypoglycemia coma status (HCC) Increase does of Amaryl to 2mg  daily.  - POCT HgB A1C - glimepiride (AMARYL) 2 MG tablet; Take 1 tablet (2 mg total) by mouth daily with supper.  Dispense: 30 tablet; Refill: 2 - glucose blood (ACCU-CHEK GUIDE) test strip; Use as instructed  Dispense: 100 each; Refill: 12  2. Essential hypertension, benign Controlled, continue current management.   3. Mixed hyperlipidemia No current issues.   4. Discoloration of skin of foot Resolved. Continue to monitor.   5. PVD (peripheral vascular disease) (HCC) Stable, at this time. Continue to follow, patient is not taking pletal.   6. Gastroesophageal reflux disease without esophagitis Stable, continue Prilosec as discussed.   7. Erectile  dysfunction, unspecified erectile dysfunction type - tadalafil (CIALIS) 5 MG tablet; Take 1 tablet (5 mg total) by mouth daily as needed.  Dispense: 30 tablet; Refill: 0    General Counseling: Shawn Greene understanding of the findings of todays visit and agrees with plan of treatment. I have discussed any further diagnostic evaluation that may be needed or ordered today. We also reviewed his medications today. he has been encouraged to call the office with any questions or concerns that should arise related to todays visit.    Orders Placed This Encounter  Procedures  . POCT HgB A1C    No orders of the defined types were placed in this encounter.   Time spent: 30 Minutes   This patient was seen by Orson Gear AGNP-C in Collaboration with Dr [name] as a part of collaborative care agreement     Kendell Bane AGNP-C Internal medicine

## 2019-09-02 ENCOUNTER — Other Ambulatory Visit: Payer: Self-pay

## 2019-09-02 DIAGNOSIS — E11649 Type 2 diabetes mellitus with hypoglycemia without coma: Secondary | ICD-10-CM

## 2019-09-02 MED ORDER — FREESTYLE SYSTEM KIT: PACK | 1 refills | Status: DC

## 2019-09-02 MED ORDER — CVS GLUCOSE METER TEST STRIPS VI STRP: ORAL_STRIP | 12 refills | Status: DC

## 2019-09-04 ENCOUNTER — Other Ambulatory Visit: Payer: Self-pay

## 2019-09-04 DIAGNOSIS — E11649 Type 2 diabetes mellitus with hypoglycemia without coma: Secondary | ICD-10-CM

## 2019-09-04 MED ORDER — FREESTYLE SYSTEM KIT: PACK | 1 refills | Status: DC

## 2019-09-05 ENCOUNTER — Other Ambulatory Visit: Payer: Self-pay

## 2019-09-05 ENCOUNTER — Telehealth: Payer: Self-pay

## 2019-09-05 DIAGNOSIS — E11649 Type 2 diabetes mellitus with hypoglycemia without coma: Secondary | ICD-10-CM

## 2019-09-05 MED ORDER — BLOOD GLUCOSE MONITOR KIT
PACK | 3 refills | Status: DC
Start: 2019-09-05 — End: 2023-12-10

## 2019-09-05 MED ORDER — GLUCOSE BLOOD VI STRP
ORAL_STRIP | 3 refills | Status: DC
Start: 2019-09-05 — End: 2020-07-21

## 2019-09-05 NOTE — Telephone Encounter (Signed)
ok'd verbal for [facility] for RX meter one touch and test strips 100 2x daily and lancets 100 2xdaily. Shawn Greene

## 2019-10-03 ENCOUNTER — Other Ambulatory Visit: Payer: Self-pay | Admitting: Adult Health

## 2019-10-03 DIAGNOSIS — N529 Male erectile dysfunction, unspecified: Secondary | ICD-10-CM

## 2019-11-05 ENCOUNTER — Other Ambulatory Visit: Payer: Self-pay | Admitting: Adult Health

## 2019-11-05 DIAGNOSIS — N529 Male erectile dysfunction, unspecified: Secondary | ICD-10-CM

## 2019-11-17 ENCOUNTER — Other Ambulatory Visit: Payer: Self-pay | Admitting: Adult Health

## 2019-11-17 DIAGNOSIS — E1140 Type 2 diabetes mellitus with diabetic neuropathy, unspecified: Secondary | ICD-10-CM

## 2019-11-17 DIAGNOSIS — K219 Gastro-esophageal reflux disease without esophagitis: Secondary | ICD-10-CM

## 2019-12-02 ENCOUNTER — Encounter: Payer: Self-pay | Admitting: Hospice and Palliative Medicine

## 2019-12-02 ENCOUNTER — Other Ambulatory Visit: Payer: Self-pay

## 2019-12-02 ENCOUNTER — Ambulatory Visit (INDEPENDENT_AMBULATORY_CARE_PROVIDER_SITE_OTHER): Payer: PPO | Admitting: Hospice and Palliative Medicine

## 2019-12-02 VITALS — BP 122/84 | HR 98 | Temp 98.6°F | Resp 16 | Ht 69.0 in | Wt 182.4 lb

## 2019-12-02 DIAGNOSIS — E1140 Type 2 diabetes mellitus with diabetic neuropathy, unspecified: Secondary | ICD-10-CM

## 2019-12-02 DIAGNOSIS — E11649 Type 2 diabetes mellitus with hypoglycemia without coma: Secondary | ICD-10-CM | POA: Diagnosis not present

## 2019-12-02 DIAGNOSIS — I10 Essential (primary) hypertension: Secondary | ICD-10-CM | POA: Diagnosis not present

## 2019-12-02 DIAGNOSIS — N400 Enlarged prostate without lower urinary tract symptoms: Secondary | ICD-10-CM

## 2019-12-02 DIAGNOSIS — E781 Pure hyperglyceridemia: Secondary | ICD-10-CM | POA: Diagnosis not present

## 2019-12-02 DIAGNOSIS — K219 Gastro-esophageal reflux disease without esophagitis: Secondary | ICD-10-CM

## 2019-12-02 DIAGNOSIS — N529 Male erectile dysfunction, unspecified: Secondary | ICD-10-CM

## 2019-12-02 LAB — POCT GLYCOSYLATED HEMOGLOBIN (HGB A1C): Hemoglobin A1C: 6.3 % — AB (ref 4.0–5.6)

## 2019-12-02 MED ORDER — LISINOPRIL 20 MG PO TABS: 20.0000 mg | ORAL_TABLET | Freq: Every day | ORAL | 3 refills | Status: DC

## 2019-12-02 MED ORDER — OMEPRAZOLE 20 MG PO CPDR: 20.0000 mg | DELAYED_RELEASE_CAPSULE | Freq: Every day | ORAL | 3 refills | Status: DC

## 2019-12-02 MED ORDER — GABAPENTIN 800 MG PO TABS: 800.0000 mg | ORAL_TABLET | Freq: Three times a day (TID) | ORAL | 3 refills | Status: DC

## 2019-12-02 MED ORDER — TADALAFIL 5 MG PO TABS: 5.0000 mg | ORAL_TABLET | Freq: Every day | ORAL | 3 refills | Status: DC | PRN

## 2019-12-02 MED ORDER — METOPROLOL TARTRATE 50 MG PO TABS: 50.0000 mg | ORAL_TABLET | Freq: Two times a day (BID) | ORAL | 6 refills | Status: DC

## 2019-12-02 MED ORDER — TAMSULOSIN HCL 0.4 MG PO CAPS: 0.4000 mg | ORAL_CAPSULE | Freq: Every day | ORAL | 2 refills | Status: DC

## 2019-12-02 MED ORDER — GEMFIBROZIL 600 MG PO TABS: 600.0000 mg | ORAL_TABLET | Freq: Two times a day (BID) | ORAL | 6 refills | Status: DC

## 2019-12-02 NOTE — Progress Notes (Signed)
[facility] [place], [place] [address]  Internal MEDICINE  Office Visit Note  Patient Name: Shawn Greene  [id number]  [id number]  Date of Service: 12/02/2019  Chief Complaint  Patient presents with  . Diabetes    3 month fup  . Quality Metric Gaps    Hep C screen, eye exam (scheduling appt soon)  . controlled substance policy    ackowledged    HPI Patient is here for routine follow-up He has been monitoring his glucose levels at home, checks his fasting levels in the AM and check them again at night, he says they have been averaging 160-200's--feels this is getting uncontrolled He has not changed his eating habits and remains active He is scheduled for his annual eye exam next month He has been tolerating glimipiride without any negative effects   Was put on Cialis for ED as well as urinary frequency and issues with emptying his bladder--his urinary symptoms have improved but has not noticed any significant improvements in ED---reports no negative side effects from medication  Planning to have his right knee replaced--has his left knee done last year   Current Medication: Outpatient Encounter Medications as of 12/02/2019  Medication Sig  . blood glucose meter kit and supplies KIT Dispense based on patient and insurance preference. Use twice daily as directed  . gabapentin (NEURONTIN) 800 MG tablet Take 1 tablet (800 mg total) by mouth 3 (three) times daily.  Marland Kitchen gemfibrozil (LOPID) 600 MG tablet Take 1 tablet (600 mg total) by mouth 2 (two) times daily before a meal.  . glimepiride (AMARYL) 2 MG tablet Take 1 tablet (2 mg total) by mouth daily with supper.  Marland Kitchen glucose blood test strip Use as instructed to test blood sugar twice daily  . Lancets 28G MISC by Does not apply route daily.  Marland Kitchen lisinopril (ZESTRIL) 20 MG tablet Take 1 tablet (20 mg total) by mouth daily. Take one tab po qd  . metoprolol tartrate (LOPRESSOR) 50 MG tablet Take 1 tablet (50 mg  total) by mouth 2 (two) times daily.  Marland Kitchen omeprazole (PRILOSEC) 20 MG capsule Take 1 capsule (20 mg total) by mouth daily.  . tadalafil (CIALIS) 5 MG tablet Take 1 tablet (5 mg total) by mouth daily as needed.  . tamsulosin (FLOMAX) 0.4 MG CAPS capsule Take 1 capsule (0.4 mg total) by mouth daily after supper.  . [DISCONTINUED] DULoxetine (CYMBALTA) 20 MG capsule Take 2 capsules (40 mg total) by mouth daily.  . [DISCONTINUED] gabapentin (NEURONTIN) 800 MG tablet TAKE 1 TABLET BY MOUTH 3 TIMES A DAY  . [DISCONTINUED] gemfibrozil (LOPID) 600 MG tablet Take 1 tablet (600 mg total) by mouth 2 (two) times daily before a meal.  . [DISCONTINUED] lisinopril (ZESTRIL) 20 MG tablet Take 1 tablet (20 mg total) by mouth daily. Take one tab po qd  . [DISCONTINUED] metoprolol tartrate (LOPRESSOR) 50 MG tablet Take 1 tablet (50 mg total) by mouth 2 (two) times daily.  . [DISCONTINUED] omeprazole (PRILOSEC) 20 MG capsule TAKE 1 CAPSULE BY MOUTH DAILY  . [DISCONTINUED] tadalafil (CIALIS) 5 MG tablet TAKE ONE TABLET BY MOUTH DAILY AS NEEDED  . [DISCONTINUED] tamsulosin (FLOMAX) 0.4 MG CAPS capsule Take 1 capsule (0.4 mg total) by mouth daily after supper.  . [DISCONTINUED] ciprofloxacin (CILOXAN) 0.3 % ophthalmic ointment Place 0.3 ml in both ears 2 times daily (Patient not taking: Reported on 12/02/2019)   No facility-administered encounter medications on file as of 12/02/2019.    Surgical  History: Past Surgical History:  Procedure Laterality Date  . ANKLE SURGERY    . COLECTOMY  2012    Medical History: Past Medical History:  Diagnosis Date  . Chronic tension-type headache, not intractable   . Essential (primary) hypertension   . Mixed hyperlipidemia   . Psoriasis   . Type 2 diabetes, controlled, with neuropathy ([place])   . 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 Hypertension Mother     Social History   Socioeconomic History   . Marital status: Married    Spouse name: Not on file  . Number of children: Not on file  . Years of education: Not on file  . Highest education level: Not on file  Occupational History  . Not on file  Tobacco Use  . Smoking status: Never Smoker  . Smokeless tobacco: Never Used  Substance and Sexual Activity  . Alcohol use: Yes    Alcohol/week: 1.0 standard drink    Types: 1 Cans of beer per week    Comment: occ  . Drug use: No  . Sexual activity: Not on file  Other Topics Concern  . Not on file  Social History Narrative  . Not on file   Social Determinants of Health   Financial Resource Strain:   . Difficulty of Paying Living Expenses: Not on file  Food Insecurity:   . Worried About Charity fundraiser in the Last Year: Not on file  . Ran Out of Food in the Last Year: Not on file  Transportation Needs:   . Lack of Transportation (Medical): Not on file  . Lack of Transportation (Non-Medical): Not on file  Physical Activity:   . Days of Exercise per Week: Not on file  . Minutes of Exercise per Session: Not on file  Stress:   . Feeling of Stress : Not on file  Social Connections:   . Frequency of Communication with Friends and Family: Not on file  . Frequency of Social Gatherings with Friends and Family: Not on file  . Attends Religious Services: Not on file  . Active Member of Clubs or Organizations: Not on file  . Attends Archivist Meetings: Not on file  . Marital Status: Not on file  Intimate Partner Violence:   . Fear of Current or Ex-Partner: Not on file  . Emotionally Abused: Not on file  . Physically Abused: Not on file  . Sexually Abused: Not on file   Review of Systems  Constitutional: Negative for chills, diaphoresis, fatigue and unexpected weight change.  HENT: Negative for congestion, postnasal drip, rhinorrhea, sneezing and sore throat.   Eyes: Negative for photophobia, redness and visual disturbance.  Respiratory: Negative for cough,  chest tightness and shortness of breath.   Cardiovascular: Negative for chest pain, palpitations and leg swelling.  Gastrointestinal: Negative for abdominal pain, constipation, diarrhea, nausea and vomiting.  Genitourinary: Negative for dysuria and frequency.  Musculoskeletal: Negative for arthralgias, back pain, joint swelling and neck pain.  Skin: Negative for rash.  Neurological: Negative for dizziness, tremors, light-headedness, numbness and headaches.  Hematological: Negative for adenopathy. Does not bruise/bleed easily.  Psychiatric/Behavioral: Negative for behavioral problems (Depression), sleep disturbance and suicidal ideas. The patient is not nervous/anxious.     Vital Signs: BP 122/84   Pulse 98   Temp 98.6 F (37 C)   Resp 16   Ht 5' 9"  (1.753 m)   Wt  182 lb 6.4 oz (82.7 kg)   SpO2 96%   BMI 26.94 kg/m    Physical Exam Vitals reviewed.  Constitutional:      Appearance: Normal appearance.  Cardiovascular:     Rate and Rhythm: Normal rate and regular rhythm.     Pulses: Normal pulses.     Heart sounds: Normal heart sounds.  Pulmonary:     Effort: Pulmonary effort is normal.     Breath sounds: Normal breath sounds.  Musculoskeletal:        General: Normal range of motion.  Skin:    General: Skin is warm.  Neurological:     General: No focal deficit present.     Mental Status: He is alert and oriented to person, place, and time. Mental status is at baseline.    Assessment/Plan: 1. Erectile dysfunction, unspecified erectile dysfunction type Discussed that he may try increasing his dose to 10 mg to see if this improves his ED symptoms--will closely monitor - tadalafil (CIALIS) 5 MG tablet; Take 1 tablet (5 mg total) by mouth daily as needed.  Dispense: 30 tablet; Refill: 3  2. Type 2 diabetes mellitus with diabetic neuropathy, without long-term current use of insulin (HCC) A1C well controlled today at 6.3, will continue routine monitoring Discussed we may  consider changing his therapy at next visit, to switch from glimepiride Continue with gabapentin for neuropathy pain, well controlled, requesting refills - POCT glycosylated hemoglobin (Hb A1C) - gabapentin (NEURONTIN) 800 MG tablet; Take 1 tablet (800 mg total) by mouth 3 (three) times daily.  Dispense: 90 tablet; Refill: 3  3. Pure hyperglyceridemia Stable at this time, requesting refills - gemfibrozil (LOPID) 600 MG tablet; Take 1 tablet (600 mg total) by mouth 2 (two) times daily before a meal.  Dispense: 60 tablet; Refill: 6  4. Essential hypertension, benign Stable at this time, requesting refills - lisinopril (ZESTRIL) 20 MG tablet; Take 1 tablet (20 mg total) by mouth daily. Take one tab po qd  Dispense: 30 tablet; Refill: 3 - metoprolol tartrate (LOPRESSOR) 50 MG tablet; Take 1 tablet (50 mg total) by mouth 2 (two) times daily.  Dispense: 60 tablet; Refill: 6  5. Gastroesophageal reflux disease without esophagitis Stable at this time, requesting refills - omeprazole (PRILOSEC) 20 MG capsule; Take 1 capsule (20 mg total) by mouth daily.  Dispense: 30 capsule; Refill: 3  6. Benign prostatic hyperplasia without lower urinary tract symptoms Urinary symptoms well controlled at this time with addition of cialis, requesting refills today - tamsulosin (FLOMAX) 0.4 MG CAPS capsule; Take 1 capsule (0.4 mg total) by mouth daily after supper.  Dispense: 30 capsule; Refill: 2  General Counseling: Yamin verbalizes understanding of the findings of todays visit and agrees with plan of treatment. I have discussed any further diagnostic evaluation that may be needed or ordered today. We also reviewed his medications today. he has been encouraged to call the office with any questions or concerns that should arise related to todays visit.    Orders Placed This Encounter  Procedures  . POCT glycosylated hemoglobin (Hb A1C)    Meds ordered this encounter  Medications  . tadalafil (CIALIS) 5 MG  tablet    Sig: Take 1 tablet (5 mg total) by mouth daily as needed.    Dispense:  30 tablet    Refill:  3  . gabapentin (NEURONTIN) 800 MG tablet    Sig: Take 1 tablet (800 mg total) by mouth 3 (three) times daily.    Dispense:  90 tablet    Refill:  3  . gemfibrozil (LOPID) 600 MG tablet    Sig: Take 1 tablet (600 mg total) by mouth 2 (two) times daily before a meal.    Dispense:  60 tablet    Refill:  6  . lisinopril (ZESTRIL) 20 MG tablet    Sig: Take 1 tablet (20 mg total) by mouth daily. Take one tab po qd    Dispense:  30 tablet    Refill:  3  . metoprolol tartrate (LOPRESSOR) 50 MG tablet    Sig: Take 1 tablet (50 mg total) by mouth 2 (two) times daily.    Dispense:  60 tablet    Refill:  6  . omeprazole (PRILOSEC) 20 MG capsule    Sig: Take 1 capsule (20 mg total) by mouth daily.    Dispense:  30 capsule    Refill:  3  . tamsulosin (FLOMAX) 0.4 MG CAPS capsule    Sig: Take 1 capsule (0.4 mg total) by mouth daily after supper.    Dispense:  30 capsule    Refill:  2    Time spent: 30 Minutes Time spent includes review of chart, medications, test results and follow-up plan with the patient.  This patient was seen by Theodoro Grist AGNP-C in Collaboration with Dr [name] as a part of collaborative care agreement     Tanna Furry. Japleen Tornow AGNP-C Internal medicine

## 2019-12-16 ENCOUNTER — Other Ambulatory Visit: Payer: Self-pay

## 2019-12-16 DIAGNOSIS — E11649 Type 2 diabetes mellitus with hypoglycemia without coma: Secondary | ICD-10-CM

## 2019-12-16 MED ORDER — GLIMEPIRIDE 2 MG PO TABS: 2.0000 mg | ORAL_TABLET | Freq: Every day | ORAL | 2 refills | Status: DC

## 2019-12-30 DIAGNOSIS — M179 Osteoarthritis of knee, unspecified: Secondary | ICD-10-CM | POA: Diagnosis not present

## 2019-12-30 DIAGNOSIS — M1711 Unilateral primary osteoarthritis, right knee: Secondary | ICD-10-CM | POA: Diagnosis not present

## 2020-01-08 ENCOUNTER — Encounter: Payer: PPO | Admitting: Nurse Practitioner

## 2020-01-13 DIAGNOSIS — M21372 Foot drop, left foot: Secondary | ICD-10-CM | POA: Diagnosis not present

## 2020-01-21 ENCOUNTER — Encounter: Payer: Self-pay | Admitting: Nurse Practitioner

## 2020-01-21 ENCOUNTER — Other Ambulatory Visit: Payer: Self-pay

## 2020-01-21 ENCOUNTER — Ambulatory Visit (INDEPENDENT_AMBULATORY_CARE_PROVIDER_SITE_OTHER): Payer: PPO | Admitting: Nurse Practitioner

## 2020-01-21 VITALS — BP 146/86 | HR 93 | Temp 97.5°F | Resp 16 | Ht 69.0 in | Wt 187.0 lb

## 2020-01-21 DIAGNOSIS — B351 Tinea unguium: Secondary | ICD-10-CM | POA: Insufficient documentation

## 2020-01-21 DIAGNOSIS — Z01818 Encounter for other preprocedural examination: Secondary | ICD-10-CM | POA: Diagnosis not present

## 2020-01-21 DIAGNOSIS — I10 Essential (primary) hypertension: Secondary | ICD-10-CM | POA: Diagnosis not present

## 2020-01-21 DIAGNOSIS — Z0001 Encounter for general adult medical examination with abnormal findings: Secondary | ICD-10-CM | POA: Diagnosis not present

## 2020-01-21 DIAGNOSIS — R300 Dysuria: Secondary | ICD-10-CM

## 2020-01-21 DIAGNOSIS — I4510 Unspecified right bundle-branch block: Secondary | ICD-10-CM | POA: Diagnosis not present

## 2020-01-21 DIAGNOSIS — N529 Male erectile dysfunction, unspecified: Secondary | ICD-10-CM | POA: Insufficient documentation

## 2020-01-21 DIAGNOSIS — E1140 Type 2 diabetes mellitus with diabetic neuropathy, unspecified: Secondary | ICD-10-CM

## 2020-01-21 MED ORDER — SILDENAFIL CITRATE 100 MG PO TABS: 100.0000 mg | ORAL_TABLET | Freq: Every day | ORAL | 5 refills | Status: DC | PRN

## 2020-01-21 MED ORDER — TERBINAFINE HCL 250 MG PO TABS: 250.0000 mg | ORAL_TABLET | Freq: Every day | ORAL | 5 refills | Status: DC

## 2020-01-21 NOTE — Progress Notes (Signed)
[facility] [place], [place] [address]  Internal MEDICINE  Office Visit Note  Patient Name: Shawn Greene  [id number]  [id number]  Date of Service: 01/21/2020   Pt is here for routine health maintenance examination  Chief Complaint  Patient presents with  . Annual Exam    pre-op clearance  . Diabetes  . Hyperlipidemia  . Hypertension  . policy update form     The patient presents for health maintenance exam.  -having right knee replacement surgery on 02/05/2020 and does need to have medical/cardiovascular clearance.  -ECG showing right bundle branch block. This is unchanged from prior exams. Denies chest pain, chest pressure, shortness of breath. Blood pressure well controlled. -fungal infection of the toenails of both feet. Dry skin of the heels.  -moderate peripheral neuropathy. Currently on gabapentin 853m three times daily. Still having significant numbness. Has seen neurologist in the past and did not have other suggestions to help with neuropathy.  -good control of blood sugars. Last check HgbA1c 6.3 in 11/2019.  -needs to have routine, fasting labs .    Current Medication: Outpatient Encounter Medications as of 01/21/2020  Medication Sig  . blood glucose meter kit and supplies KIT Dispense based on patient and insurance preference. Use twice daily as directed  . gabapentin (NEURONTIN) 800 MG tablet Take 1 tablet (800 mg total) by mouth 3 (three) times daily.  .Marland Kitchengemfibrozil (LOPID) 600 MG tablet Take 1 tablet (600 mg total) by mouth 2 (two) times daily before a meal.  . glimepiride (AMARYL) 2 MG tablet Take 1 tablet (2 mg total) by mouth daily with supper.  .Marland Kitchenglucose blood test strip Use as instructed to test blood sugar twice daily  . Lancets 28G MISC by Does not apply route daily.  .Marland Kitchenlisinopril (ZESTRIL) 20 MG tablet Take 1 tablet (20 mg total) by mouth daily. Take one tab po qd  . metoprolol tartrate (LOPRESSOR) 50 MG tablet Take 1 tablet  (50 mg total) by mouth 2 (two) times daily.  .Marland Kitchenomeprazole (PRILOSEC) 20 MG capsule Take 1 capsule (20 mg total) by mouth daily.  . sildenafil (VIAGRA) 100 MG tablet Take 1 tablet (100 mg total) by mouth daily as needed for erectile dysfunction.  . terbinafine (LAMISIL) 250 MG tablet Take 1 tablet (250 mg total) by mouth daily.  . [DISCONTINUED] tadalafil (CIALIS) 5 MG tablet Take 1 tablet (5 mg total) by mouth daily as needed. (Patient not taking: Reported on 01/21/2020)  . [DISCONTINUED] tamsulosin (FLOMAX) 0.4 MG CAPS capsule Take 1 capsule (0.4 mg total) by mouth daily after supper. (Patient not taking: Reported on 01/21/2020)   No facility-administered encounter medications on file as of 01/21/2020.    Surgical History: Past Surgical History:  Procedure Laterality Date  . ANKLE SURGERY    . COLECTOMY  2012    Medical History: Past Medical History:  Diagnosis Date  . Chronic tension-type headache, not intractable   . Essential (primary) hypertension   . Mixed hyperlipidemia   . Psoriasis   . Type 2 diabetes, controlled, with neuropathy [place]   . 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 Hypertension Mother       Review of Systems  Constitutional: Negative for activity change, chills, fatigue and unexpected weight change.  HENT: Negative for congestion, ear pain, postnasal drip, rhinorrhea, sneezing and sore throat.   Respiratory: Negative for cough, chest tightness  and shortness of breath.   Cardiovascular: Negative for chest pain and palpitations.  Gastrointestinal: Negative for abdominal pain, constipation, diarrhea, nausea and vomiting.  Endocrine: Negative for cold intolerance, heat intolerance, polydipsia and polyuria.       Blood sugars doing well   Genitourinary: Negative for flank pain, frequency and urgency.       Erectile dysfunction   Musculoskeletal: Positive for arthralgias. Negative for  back pain, joint swelling and neck pain.       Right knee pain. Getting ready to have total knee replacement on 02/05/2020  Skin: Negative for rash.  Allergic/Immunologic: Negative for environmental allergies.  Neurological: Positive for numbness. Negative for dizziness, tremors and headaches.  Hematological: Positive for adenopathy. Does not bruise/bleed easily.  Psychiatric/Behavioral: Negative for behavioral problems (Depression), sleep disturbance and suicidal ideas. The patient is not nervous/anxious.      Today's Vitals   01/21/20 0936  BP: (!) 146/86  Pulse: 93  Resp: 16  Temp: (!) 97.5 F (36.4 C)  SpO2: 97%  Weight: 187 lb (84.8 kg)  Height: 5' 9"  (1.753 m)   Body mass index is 27.62 kg/m.  Physical Exam Vitals and nursing note reviewed.  Constitutional:      General: He is not in acute distress.    Appearance: Normal appearance. He is well-developed. He is not diaphoretic.  HENT:     Head: Normocephalic and atraumatic.     Nose: Nose normal.     Mouth/Throat:     Pharynx: No oropharyngeal exudate.  Eyes:     Pupils: Pupils are equal, round, and reactive to light.  Neck:     Thyroid: No thyromegaly.     Vascular: No carotid bruit or JVD.     Trachea: No tracheal deviation.  Cardiovascular:     Rate and Rhythm: Normal rate and regular rhythm.     Pulses:          Dorsalis pedis pulses are 1+ on the right side and 1+ on the left side.       Posterior tibial pulses are 1+ on the right side and 1+ on the left side.     Heart sounds: Normal heart sounds. No murmur heard.  No friction rub. No gallop.   Pulmonary:     Effort: Pulmonary effort is normal. No respiratory distress.     Breath sounds: Normal breath sounds. No wheezing or rales.  Chest:     Chest wall: No tenderness.  Abdominal:     General: Bowel sounds are normal.     Palpations: Abdomen is soft.     Tenderness: There is no abdominal tenderness.  Musculoskeletal:        General: Normal range of  motion.     Cervical back: Normal range of motion and neck supple.     Right foot: Normal range of motion. No deformity or bunion.     Left foot: Normal range of motion. No deformity or bunion.  Feet:     Right foot:     Protective Sensation: 10 sites tested. 5 sites sensed.     Skin integrity: Dry skin present.     Toenail Condition: Right toenails are abnormally thick. Fungal disease present.    Left foot:     Protective Sensation: 10 sites tested. 5 sites sensed.     Skin integrity: Dry skin present.     Toenail Condition: Left toenails are abnormally thick. Fungal disease present. Lymphadenopathy:     Cervical: No cervical adenopathy.  Skin:    General: Skin is warm and dry.     Capillary Refill: Capillary refill takes 2 to 3 seconds.  Neurological:     Mental Status: He is alert and oriented to person, place, and time. Mental status is at baseline.     Cranial Nerves: No cranial nerve deficit.  Psychiatric:        Mood and Affect: Mood normal.        Behavior: Behavior normal.        Thought Content: Thought content normal.        Judgment: Judgment normal.    Depression screen [facility] 2/9 01/21/2020 12/02/2019 09/01/2019 01/07/2019 11/06/2018  Decreased Interest 0 0 0 0 0  Down, Depressed, Hopeless 0 0 0 0 0  PHQ - 2 Score 0 0 0 0 0    Functional Status Survey: Is the patient deaf or have difficulty hearing?: Yes Does the patient have difficulty seeing, even when wearing glasses/contacts?: No Does the patient have difficulty concentrating, remembering, or making decisions?: No Does the patient have difficulty walking or climbing stairs?: Yes Does the patient have difficulty dressing or bathing?: No Does the patient have difficulty doing errands alone such as visiting a doctor's office or shopping?: No  MMSE - [place] Exam 01/07/2019 06/05/2017  Orientation to time 5 5  Orientation to Place 5 5  Registration 3 3  Attention/ Calculation 5 5  Recall 3 3  Language-  name 2 objects 2 2  Language- repeat 1 1  Language- follow 3 step command 3 3  Language- read & follow direction 1 1  Write a sentence 1 1  Copy design 1 1  Total score 30 30    Fall Risk  01/21/2020 12/02/2019 01/07/2019 10/15/2018 08/02/2018  Falls in the past year? 0 0 0 0 0  Number falls in past yr: - - - 0 -  Injury with Fall? - - - 0 -  Risk for fall due to : No Fall Risks - - - -  Follow up Falls evaluation completed - - - -      LABS: Recent Results (from the past 2160 hour(s))  POCT glycosylated hemoglobin (Hb A1C)     Status: Abnormal   Collection Time: 12/02/19  8:52 AM  Result Value Ref Range   Hemoglobin A1C 6.3 (A) 4.0 - 5.6 %   HbA1c POC (<> result, manual entry)     HbA1c, POC (prediabetic range)     HbA1c, POC (controlled diabetic range)      Assessment/Plan: 1. Encounter for general adult medical examination with abnormal findings Annual health maintenance exam today.   2. Preop testing - EKG 12-Lead showing right bundle branch block. Unchanged from prior ECGs. Patient asymptomatic. Will monitor.  Order for routine, fasting labs given to patient. Will clear for surgery with low to medium risk based on history of diabetes with neuropathy and right bundle branch block.   3. Right bundle branch block Unchanged from prior ECGs. Patient asymptomatic. Will monitor.   4. Type 2 diabetes mellitus with diabetic neuropathy, without long-term current use of insulin ([place]) Blood sugars doing well with most recent HgbA1c 6.3 in 11/2019. Stable neuropathy. Continue gabapentin. Consider adding nortriptyline at next visit to help with control of symptoms.   5. Essential hypertension, benign Stble. Continue bp medication as prescribed   6. Tinea unguium Start Lamisil 211m daily for next several months. Checking LFTs today.  - terbinafine (LAMISIL) 250 MG tablet; Take 1  tablet (250 mg total) by mouth daily.  Dispense: 30 tablet; Refill: 5  7. Erectile dysfunction,  unspecified erectile dysfunction type May take viagra 50-129m as needed. New prescription sent to his pharmacy today.  - sildenafil (VIAGRA) 100 MG tablet; Take 1 tablet (100 mg total) by mouth daily as needed for erectile dysfunction.  Dispense: 10 tablet; Refill: 5  8. Dysuria - UA/M w/rflx Culture, Routine  General Counseling: Gjamell laymonunderstanding of the findings of todays visit and agrees with plan of treatment. I have discussed any further diagnostic evaluation that may be needed or ordered today. We also reviewed his medications today. he has been encouraged to call the office with any questions or concerns that should arise related to todays visit.    Counseling:  Diabetes Counseling:  1. Addition of ACE inh/ ARB'S for nephroprotection. Microalbumin is updated  2. Diabetic foot care, prevention of complications. Podiatry consult 3. Exercise and lose weight.  4. Diabetic eye examination, Diabetic eye exam is updated  5. Monitor blood sugar closlely. nutrition counseling.  6. Sign and symptoms of hypoglycemia including shaking sweating,confusion and headaches.   This patient was seen by HLeretha PolFNP Collaboration with Dr FLavera Guiseas a part of collaborative care agreement  Orders Placed This Encounter  Procedures  . UA/M w/rflx Culture, Routine  . EKG 12-Lead    Meds ordered this encounter  Medications  . sildenafil (VIAGRA) 100 MG tablet    Sig: Take 1 tablet (100 mg total) by mouth daily as needed for erectile dysfunction.    Dispense:  10 tablet    Refill:  5    Order Specific Question:   Supervising Provider    Answer:   KLavera Guise[[[id number]] . terbinafine (LAMISIL) 250 MG tablet    Sig: Take 1 tablet (250 mg total) by mouth daily.    Dispense:  30 tablet    Refill:  5    Order Specific Question:   Supervising Provider    Answer:   KLavera Guise[[[id number]]   Total time spent: 642Minutes  Time spent includes review of chart, medications, test  results, and follow up plan with the patient.     FLavera Guise MD  Internal Medicine

## 2020-01-22 ENCOUNTER — Other Ambulatory Visit: Payer: Self-pay | Admitting: Nurse Practitioner

## 2020-01-22 DIAGNOSIS — Z1159 Encounter for screening for other viral diseases: Secondary | ICD-10-CM | POA: Diagnosis not present

## 2020-01-22 DIAGNOSIS — Z125 Encounter for screening for malignant neoplasm of prostate: Secondary | ICD-10-CM | POA: Diagnosis not present

## 2020-01-22 DIAGNOSIS — Z01818 Encounter for other preprocedural examination: Secondary | ICD-10-CM | POA: Diagnosis not present

## 2020-01-22 DIAGNOSIS — E1165 Type 2 diabetes mellitus with hyperglycemia: Secondary | ICD-10-CM | POA: Diagnosis not present

## 2020-01-22 DIAGNOSIS — Z0001 Encounter for general adult medical examination with abnormal findings: Secondary | ICD-10-CM | POA: Diagnosis not present

## 2020-01-22 DIAGNOSIS — I10 Essential (primary) hypertension: Secondary | ICD-10-CM | POA: Diagnosis not present

## 2020-01-22 LAB — UA/M W/RFLX CULTURE, ROUTINE
Bilirubin, UA: NEGATIVE
Glucose, UA: NEGATIVE
Ketones, UA: NEGATIVE
Leukocytes,UA: NEGATIVE
Nitrite, UA: NEGATIVE
Protein,UA: NEGATIVE
RBC, UA: NEGATIVE
Specific Gravity, UA: 1.024 (ref 1.005–1.030)
Urobilinogen, Ur: 0.2 mg/dL (ref 0.2–1.0)
pH, UA: 5.5 (ref 5.0–7.5)

## 2020-01-22 LAB — MICROSCOPIC EXAMINATION
Bacteria, UA: NONE SEEN
Casts: NONE SEEN /lpf
Epithelial Cells (non renal): NONE SEEN /hpf (ref 0–10)
RBC, Urine: NONE SEEN /hpf (ref 0–2)
WBC, UA: NONE SEEN /hpf (ref 0–5)

## 2020-01-23 LAB — PSA: Prostate Specific Ag, Serum: 1.3 ng/mL (ref 0.0–4.0)

## 2020-01-23 LAB — LIPID PANEL WITH LDL/HDL RATIO
Cholesterol, Total: 165 mg/dL (ref 100–199)
HDL: 30 mg/dL — ABNORMAL LOW (ref >39)
LDL Chol Calc (NIH): 94 mg/dL (ref 0–99)
LDL/HDL Ratio: 3.1 ratio (ref 0.0–3.6)
Triglycerides: 242 mg/dL — ABNORMAL HIGH (ref 0–149)
VLDL Cholesterol Cal: 41 mg/dL — ABNORMAL HIGH (ref 5–40)

## 2020-01-23 LAB — COMPREHENSIVE METABOLIC PANEL
ALT: 45 IU/L — ABNORMAL HIGH (ref 0–44)
AST: 40 IU/L (ref 0–40)
Albumin/Globulin Ratio: 2 (ref 1.2–2.2)
Albumin: 4.7 g/dL (ref 3.8–4.8)
Alkaline Phosphatase: 64 IU/L (ref 44–121)
BUN/Creatinine Ratio: 20 (ref 10–24)
BUN: 21 mg/dL (ref 8–27)
Bilirubin Total: 0.5 mg/dL (ref 0.0–1.2)
CO2: 20 mmol/L (ref 20–29)
Calcium: 9.8 mg/dL (ref 8.6–10.2)
Chloride: 100 mmol/L (ref 96–106)
Creatinine, Ser: 1.04 mg/dL (ref 0.76–1.27)
GFR calc Af Amer: 84 mL/min/{1.73_m2} (ref >59)
GFR calc non Af Amer: 72 mL/min/{1.73_m2} (ref >59)
Globulin, Total: 2.4 g/dL (ref 1.5–4.5)
Glucose: 105 mg/dL — ABNORMAL HIGH (ref 65–99)
Potassium: 4.5 mmol/L (ref 3.5–5.2)
Sodium: 140 mmol/L (ref 134–144)
Total Protein: 7.1 g/dL (ref 6.0–8.5)

## 2020-01-23 LAB — HCV INTERPRETATION

## 2020-01-23 LAB — T4, FREE: Free T4: 1.03 ng/dL (ref 0.82–1.77)

## 2020-01-23 LAB — CBC
Hematocrit: 41 % (ref 37.5–51.0)
Hemoglobin: 14.2 g/dL (ref 13.0–17.7)
MCH: 30.7 pg (ref 26.6–33.0)
MCHC: 34.6 g/dL (ref 31.5–35.7)
MCV: 89 fL (ref 79–97)
Platelets: 257 10*3/uL (ref 150–450)
RBC: 4.63 x10E6/uL (ref 4.14–5.80)
RDW: 11.8 % (ref 11.6–15.4)
WBC: 5.7 10*3/uL (ref 3.4–10.8)

## 2020-01-23 LAB — HCV AB W REFLEX TO QUANT PCR: HCV Ab: 0.2 s/co ratio (ref 0.0–0.9)

## 2020-01-23 LAB — HGB A1C W/O EAG: Hgb A1c MFr Bld: 6.6 % — ABNORMAL HIGH (ref 4.8–5.6)

## 2020-01-23 LAB — TSH: TSH: 2.07 u[IU]/mL (ref 0.450–4.500)

## 2020-01-27 DIAGNOSIS — M1711 Unilateral primary osteoarthritis, right knee: Secondary | ICD-10-CM | POA: Diagnosis not present

## 2020-01-28 DIAGNOSIS — M1611 Unilateral primary osteoarthritis, right hip: Secondary | ICD-10-CM | POA: Diagnosis not present

## 2020-01-29 DIAGNOSIS — R202 Paresthesia of skin: Secondary | ICD-10-CM | POA: Diagnosis not present

## 2020-02-02 DIAGNOSIS — G6289 Other specified polyneuropathies: Secondary | ICD-10-CM | POA: Diagnosis not present

## 2020-02-02 DIAGNOSIS — M21371 Foot drop, right foot: Secondary | ICD-10-CM | POA: Diagnosis not present

## 2020-02-02 DIAGNOSIS — M21372 Foot drop, left foot: Secondary | ICD-10-CM | POA: Diagnosis not present

## 2020-02-05 DIAGNOSIS — I10 Essential (primary) hypertension: Secondary | ICD-10-CM | POA: Diagnosis not present

## 2020-02-05 DIAGNOSIS — M25761 Osteophyte, right knee: Secondary | ICD-10-CM | POA: Diagnosis not present

## 2020-02-05 DIAGNOSIS — Z86718 Personal history of other venous thrombosis and embolism: Secondary | ICD-10-CM | POA: Diagnosis not present

## 2020-02-05 DIAGNOSIS — M1711 Unilateral primary osteoarthritis, right knee: Secondary | ICD-10-CM | POA: Diagnosis not present

## 2020-02-05 DIAGNOSIS — F419 Anxiety disorder, unspecified: Secondary | ICD-10-CM | POA: Diagnosis not present

## 2020-02-05 DIAGNOSIS — Z791 Long term (current) use of non-steroidal anti-inflammatories (NSAID): Secondary | ICD-10-CM | POA: Diagnosis not present

## 2020-02-05 DIAGNOSIS — E785 Hyperlipidemia, unspecified: Secondary | ICD-10-CM | POA: Diagnosis not present

## 2020-02-05 DIAGNOSIS — M25561 Pain in right knee: Secondary | ICD-10-CM | POA: Diagnosis not present

## 2020-02-05 DIAGNOSIS — E1142 Type 2 diabetes mellitus with diabetic polyneuropathy: Secondary | ICD-10-CM | POA: Diagnosis not present

## 2020-02-05 DIAGNOSIS — Z7982 Long term (current) use of aspirin: Secondary | ICD-10-CM | POA: Diagnosis not present

## 2020-02-05 DIAGNOSIS — F32A Depression, unspecified: Secondary | ICD-10-CM | POA: Diagnosis not present

## 2020-02-05 DIAGNOSIS — G8918 Other acute postprocedural pain: Secondary | ICD-10-CM | POA: Diagnosis not present

## 2020-02-05 DIAGNOSIS — E119 Type 2 diabetes mellitus without complications: Secondary | ICD-10-CM | POA: Diagnosis not present

## 2020-02-05 DIAGNOSIS — K219 Gastro-esophageal reflux disease without esophagitis: Secondary | ICD-10-CM | POA: Diagnosis not present

## 2020-02-05 DIAGNOSIS — Z96652 Presence of left artificial knee joint: Secondary | ICD-10-CM | POA: Diagnosis not present

## 2020-02-05 DIAGNOSIS — Z86711 Personal history of pulmonary embolism: Secondary | ICD-10-CM | POA: Diagnosis not present

## 2020-02-05 DIAGNOSIS — Z7984 Long term (current) use of oral hypoglycemic drugs: Secondary | ICD-10-CM | POA: Diagnosis not present

## 2020-02-23 ENCOUNTER — Inpatient Hospital Stay: Payer: PPO | Admitting: Hospice and Palliative Medicine

## 2020-02-27 DIAGNOSIS — R202 Paresthesia of skin: Secondary | ICD-10-CM | POA: Diagnosis not present

## 2020-02-27 DIAGNOSIS — R0989 Other specified symptoms and signs involving the circulatory and respiratory systems: Secondary | ICD-10-CM | POA: Diagnosis not present

## 2020-02-27 DIAGNOSIS — R2689 Other abnormalities of gait and mobility: Secondary | ICD-10-CM | POA: Diagnosis not present

## 2020-02-27 DIAGNOSIS — R200 Anesthesia of skin: Secondary | ICD-10-CM | POA: Diagnosis not present

## 2020-02-27 DIAGNOSIS — M5481 Occipital neuralgia: Secondary | ICD-10-CM | POA: Diagnosis not present

## 2020-03-04 ENCOUNTER — Encounter: Payer: Self-pay | Admitting: Hospice and Palliative Medicine

## 2020-03-04 ENCOUNTER — Ambulatory Visit (INDEPENDENT_AMBULATORY_CARE_PROVIDER_SITE_OTHER): Payer: PPO | Admitting: Hospice and Palliative Medicine

## 2020-03-04 VITALS — BP 121/63 | HR 93 | Temp 97.2°F | Resp 16 | Ht 69.0 in | Wt 175.2 lb

## 2020-03-04 DIAGNOSIS — E11649 Type 2 diabetes mellitus with hypoglycemia without coma: Secondary | ICD-10-CM

## 2020-03-04 DIAGNOSIS — E1140 Type 2 diabetes mellitus with diabetic neuropathy, unspecified: Secondary | ICD-10-CM | POA: Diagnosis not present

## 2020-03-04 DIAGNOSIS — Z96651 Presence of right artificial knee joint: Secondary | ICD-10-CM

## 2020-03-04 DIAGNOSIS — K219 Gastro-esophageal reflux disease without esophagitis: Secondary | ICD-10-CM | POA: Diagnosis not present

## 2020-03-04 DIAGNOSIS — I10 Essential (primary) hypertension: Secondary | ICD-10-CM | POA: Diagnosis not present

## 2020-03-04 MED ORDER — OMEPRAZOLE 20 MG PO CPDR: 20.0000 mg | DELAYED_RELEASE_CAPSULE | Freq: Every day | ORAL | 3 refills | Status: DC

## 2020-03-04 MED ORDER — GABAPENTIN 800 MG PO TABS: 800.0000 mg | ORAL_TABLET | Freq: Three times a day (TID) | ORAL | 3 refills | Status: DC

## 2020-03-04 MED ORDER — GLIMEPIRIDE 2 MG PO TABS: 2.0000 mg | ORAL_TABLET | Freq: Every day | ORAL | 2 refills | Status: DC

## 2020-03-04 MED ORDER — LISINOPRIL 20 MG PO TABS: 20.0000 mg | ORAL_TABLET | Freq: Every day | ORAL | 3 refills | Status: DC

## 2020-03-04 MED ORDER — METOPROLOL TARTRATE 50 MG PO TABS: 50.0000 mg | ORAL_TABLET | Freq: Two times a day (BID) | ORAL | 6 refills | Status: DC

## 2020-03-04 NOTE — Progress Notes (Signed)
[facility] [place], [place] [address]  Internal MEDICINE  Office Visit Note  Patient Name: Shawn Greene  [id number]  [id number]  Date of Service: 03/07/2020     Chief Complaint  Patient presents with  . Diabetes  . Hyperlipidemia  . Hypertension  . Hospitalization Follow-up  . Dizziness     HPI Pt is here for recent hospital follow up. He was hospitalized 12/16-12/17, had right total knee arthroplasty 12/16 by Dr. Harlow Mares in [facility] No complications since surgery, has had follow-up with surgeon, staples have been removed, healing well He is scheduled to start physical therapy tomorrow   No changes in his medications at discharge, his glucose levels have remained well controlled at home since discharge-last A1C check 12/2 at 6.6 Requesting refills today on his current prescriptions  Current Medication: Outpatient Encounter Medications as of 03/04/2020  Medication Sig  . blood glucose meter kit and supplies KIT Dispense based on patient and insurance preference. Use twice daily as directed  . DULoxetine (CYMBALTA) 60 MG capsule Take 60 mg by mouth daily.  Marland Kitchen gemfibrozil (LOPID) 600 MG tablet Take 1 tablet (600 mg total) by mouth 2 (two) times daily before a meal.  . glucose blood test strip Use as instructed to test blood sugar twice daily  . Lancets 28G MISC by Does not apply route daily.  . sildenafil (VIAGRA) 100 MG tablet Take 1 tablet (100 mg total) by mouth daily as needed for erectile dysfunction.  . terbinafine (LAMISIL) 250 MG tablet Take 1 tablet (250 mg total) by mouth daily.  . [DISCONTINUED] gabapentin (NEURONTIN) 800 MG tablet Take 1 tablet (800 mg total) by mouth 3 (three) times daily.  . [DISCONTINUED] glimepiride (AMARYL) 2 MG tablet Take 1 tablet (2 mg total) by mouth daily with supper.  . [DISCONTINUED] lisinopril (ZESTRIL) 20 MG tablet Take 1 tablet (20 mg total) by mouth daily. Take one tab po qd  . [DISCONTINUED] metoprolol  tartrate (LOPRESSOR) 50 MG tablet Take 1 tablet (50 mg total) by mouth 2 (two) times daily.  . [DISCONTINUED] omeprazole (PRILOSEC) 20 MG capsule Take 1 capsule (20 mg total) by mouth daily.  Marland Kitchen gabapentin (NEURONTIN) 800 MG tablet Take 1 tablet (800 mg total) by mouth 3 (three) times daily.  Marland Kitchen glimepiride (AMARYL) 2 MG tablet Take 1 tablet (2 mg total) by mouth daily with supper.  Marland Kitchen lisinopril (ZESTRIL) 20 MG tablet Take 1 tablet (20 mg total) by mouth daily. Take one tab po qd  . metoprolol tartrate (LOPRESSOR) 50 MG tablet Take 1 tablet (50 mg total) by mouth 2 (two) times daily.  Marland Kitchen omeprazole (PRILOSEC) 20 MG capsule Take 1 capsule (20 mg total) by mouth daily.   No facility-administered encounter medications on file as of 03/04/2020.    Surgical History: Past Surgical History:  Procedure Laterality Date  . ANKLE SURGERY    . COLECTOMY  2012    Medical History: Past Medical History:  Diagnosis Date  . Chronic tension-type headache, not intractable   . Essential (primary) hypertension   . Mixed hyperlipidemia   . Psoriasis   . Type 2 diabetes, controlled, with neuropathy ([place])   . 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 Hypertension Mother     Social History   Socioeconomic History  . Marital status: Married    Spouse name: Not on file  . Number of children: Not on  file  . Years of education: Not on file  . Highest education level: Not on file  Occupational History  . Not on file  Tobacco Use  . Smoking status: Never Smoker  . Smokeless tobacco: Never Used  Substance and Sexual Activity  . Alcohol use: Yes    Alcohol/week: 1.0 standard drink    Types: 1 Cans of beer per week    Comment: occ  . Drug use: No  . Sexual activity: Not on file  Other Topics Concern  . Not on file  Social History Narrative  . Not on file   Social Determinants of Health   Financial Resource Strain: Not on file   Food Insecurity: Not on file  Transportation Needs: Not on file  Physical Activity: Not on file  Stress: Not on file  Social Connections: Not on file  Intimate Partner Violence: Not on file      Review of Systems  Constitutional: Negative for chills, fatigue and unexpected weight change.  HENT: Negative for congestion, postnasal drip, rhinorrhea, sneezing and sore throat.   Eyes: Negative for redness.  Respiratory: Negative for cough, chest tightness and shortness of breath.   Cardiovascular: Negative for chest pain and palpitations.  Gastrointestinal: Negative for abdominal pain, constipation, diarrhea, nausea and vomiting.  Genitourinary: Negative for dysuria and frequency.  Musculoskeletal: Negative for arthralgias, back pain, joint swelling and neck pain.       Right total knee replacement, recovering well  Skin: Negative for rash.  Neurological: Negative for tremors and numbness.  Hematological: Negative for adenopathy. Does not bruise/bleed easily.  Psychiatric/Behavioral: Negative for behavioral problems (Depression), sleep disturbance and suicidal ideas. The patient is not nervous/anxious.     Vital Signs: BP 121/63   Pulse 93   Temp (!) 97.2 F (36.2 C)   Resp 16   Ht 5' 9"  (1.753 m)   Wt 175 lb 3.2 oz (79.5 kg)   SpO2 98%   BMI 25.87 kg/m    Physical Exam Vitals reviewed.  Constitutional:      Appearance: Normal appearance. He is normal weight.  Cardiovascular:     Rate and Rhythm: Normal rate and regular rhythm.     Pulses: Normal pulses.     Heart sounds: Normal heart sounds.  Pulmonary:     Effort: Pulmonary effort is normal.     Breath sounds: Normal breath sounds.  Abdominal:     General: Abdomen is flat.     Palpations: Abdomen is soft.  Musculoskeletal:        General: Normal range of motion.     Cervical back: Normal range of motion.  Skin:    General: Skin is warm.     Comments: Right knee surgical incision, clean and dry  Neurological:      General: No focal deficit present.     Mental Status: He is alert and oriented to person, place, and time. Mental status is at baseline.  Psychiatric:        Mood and Affect: Mood normal.        Behavior: Behavior normal.        Thought Content: Thought content normal.        Judgment: Judgment normal.    Assessment/Plan: 1. S/P total knee arthroplasty, right Surgery 12/16--[facility] well without complications Scheduled to start physical therapy  2. Type 2 diabetes mellitus with diabetic neuropathy, without long-term current use of insulin ([place]) Stable at this time, recheck A1C in March, neuropathy well  controlled on current therapy, requesting refills - DULoxetine (CYMBALTA) 60 MG capsule; Take 60 mg by mouth daily. - gabapentin (NEURONTIN) 800 MG tablet; Take 1 tablet (800 mg total) by mouth 3 (three) times daily.  Dispense: 90 tablet; Refill: 3 - glimepiride (AMARYL) 2 MG tablet; Take 1 tablet (2 mg total) by mouth daily with supper.  Dispense: 30 tablet; Refill: 2  3. Essential hypertension, benign HR and BP remain well controlled on current therapy, requesting refills - lisinopril (ZESTRIL) 20 MG tablet; Take 1 tablet (20 mg total) by mouth daily. Take one tab po qd  Dispense: 30 tablet; Refill: 3 - metoprolol tartrate (LOPRESSOR) 50 MG tablet; Take 1 tablet (50 mg total) by mouth 2 (two) times daily.  Dispense: 60 tablet; Refill: 6  4. Gastroesophageal reflux disease without esophagitis Symptoms well controlled, requesting refills - omeprazole (PRILOSEC) 20 MG capsule; Take 1 capsule (20 mg total) by mouth daily.  Dispense: 30 capsule; Refill: 3  General Counseling: Uziel verbalizes understanding of the findings of todays visit and agrees with plan of treatment. I have discussed any further diagnostic evaluation that may be needed or ordered today. We also reviewed his medications today. he has been encouraged to call the office with any questions or  concerns that should arise related to todays visit.  I have reviewed all medical records from hospital follow up including radiology reports and consults from other physicians. Appropriate follow up diagnostics will be scheduled as needed. Patient/ Family understands the plan of treatment.   Time spent 30 minutes.  This patient was seen by Theodoro Grist AGNP-C in Collaboration with Dr [name] as a part of collaborative care agreement  Tanna Furry. [facility] Medicine

## 2020-03-05 DIAGNOSIS — M25561 Pain in right knee: Secondary | ICD-10-CM | POA: Diagnosis not present

## 2020-03-05 DIAGNOSIS — M1711 Unilateral primary osteoarthritis, right knee: Secondary | ICD-10-CM | POA: Diagnosis not present

## 2020-03-07 ENCOUNTER — Encounter: Payer: Self-pay | Admitting: Hospice and Palliative Medicine

## 2020-03-09 DIAGNOSIS — M25561 Pain in right knee: Secondary | ICD-10-CM | POA: Diagnosis not present

## 2020-03-09 DIAGNOSIS — M1711 Unilateral primary osteoarthritis, right knee: Secondary | ICD-10-CM | POA: Diagnosis not present

## 2020-03-16 DIAGNOSIS — Z96652 Presence of left artificial knee joint: Secondary | ICD-10-CM | POA: Diagnosis not present

## 2020-03-16 DIAGNOSIS — Z96651 Presence of right artificial knee joint: Secondary | ICD-10-CM | POA: Diagnosis not present

## 2020-03-17 DIAGNOSIS — M25561 Pain in right knee: Secondary | ICD-10-CM | POA: Diagnosis not present

## 2020-03-17 DIAGNOSIS — M1711 Unilateral primary osteoarthritis, right knee: Secondary | ICD-10-CM | POA: Diagnosis not present

## 2020-04-01 DIAGNOSIS — M21372 Foot drop, left foot: Secondary | ICD-10-CM | POA: Insufficient documentation

## 2020-04-06 LAB — HM DIABETES EYE EXAM

## 2020-04-16 DIAGNOSIS — I6523 Occlusion and stenosis of bilateral carotid arteries: Secondary | ICD-10-CM | POA: Diagnosis not present

## 2020-04-16 DIAGNOSIS — E782 Mixed hyperlipidemia: Secondary | ICD-10-CM | POA: Diagnosis not present

## 2020-04-16 DIAGNOSIS — I10 Essential (primary) hypertension: Secondary | ICD-10-CM | POA: Diagnosis not present

## 2020-04-16 DIAGNOSIS — R0989 Other specified symptoms and signs involving the circulatory and respiratory systems: Secondary | ICD-10-CM | POA: Diagnosis not present

## 2020-04-20 ENCOUNTER — Ambulatory Visit (INDEPENDENT_AMBULATORY_CARE_PROVIDER_SITE_OTHER): Payer: PPO | Admitting: Hospice and Palliative Medicine

## 2020-04-20 ENCOUNTER — Encounter: Payer: Self-pay | Admitting: Hospice and Palliative Medicine

## 2020-04-20 ENCOUNTER — Other Ambulatory Visit: Payer: Self-pay

## 2020-04-20 VITALS — BP 137/79 | HR 79 | Temp 97.0°F | Resp 16 | Ht 69.0 in | Wt 181.0 lb

## 2020-04-20 DIAGNOSIS — E782 Mixed hyperlipidemia: Secondary | ICD-10-CM | POA: Diagnosis not present

## 2020-04-20 DIAGNOSIS — K219 Gastro-esophageal reflux disease without esophagitis: Secondary | ICD-10-CM

## 2020-04-20 DIAGNOSIS — E11649 Type 2 diabetes mellitus with hypoglycemia without coma: Secondary | ICD-10-CM | POA: Diagnosis not present

## 2020-04-20 DIAGNOSIS — N529 Male erectile dysfunction, unspecified: Secondary | ICD-10-CM

## 2020-04-20 DIAGNOSIS — B351 Tinea unguium: Secondary | ICD-10-CM | POA: Diagnosis not present

## 2020-04-20 DIAGNOSIS — E1140 Type 2 diabetes mellitus with diabetic neuropathy, unspecified: Secondary | ICD-10-CM

## 2020-04-20 DIAGNOSIS — I10 Essential (primary) hypertension: Secondary | ICD-10-CM | POA: Diagnosis not present

## 2020-04-20 LAB — POCT GLYCOSYLATED HEMOGLOBIN (HGB A1C): Hemoglobin A1C: 6.7 % — AB (ref 4.0–5.6)

## 2020-04-20 MED ORDER — TADALAFIL 20 MG PO TABS
10.0000 mg | ORAL_TABLET | ORAL | 3 refills | Status: DC | PRN
Start: 2020-04-20 — End: 2020-08-20

## 2020-04-20 MED ORDER — GABAPENTIN 800 MG PO TABS: 800.0000 mg | ORAL_TABLET | Freq: Three times a day (TID) | ORAL | 1 refills | Status: DC

## 2020-04-20 MED ORDER — TERBINAFINE HCL 250 MG PO TABS
250.0000 mg | ORAL_TABLET | Freq: Every day | ORAL | 2 refills | Status: DC
Start: 2020-04-20 — End: 2021-07-21

## 2020-04-20 MED ORDER — GLYBURIDE 5 MG PO TABS: 5.0000 mg | ORAL_TABLET | Freq: Every day | ORAL | 2 refills | Status: DC

## 2020-04-20 MED ORDER — OMEPRAZOLE 20 MG PO CPDR: 20.0000 mg | DELAYED_RELEASE_CAPSULE | Freq: Every day | ORAL | 1 refills | Status: DC

## 2020-04-20 MED ORDER — METOPROLOL TARTRATE 50 MG PO TABS: 50.0000 mg | ORAL_TABLET | Freq: Two times a day (BID) | ORAL | 1 refills | Status: DC

## 2020-04-20 MED ORDER — DULOXETINE HCL 60 MG PO CPEP: 60.0000 mg | ORAL_CAPSULE | Freq: Every day | ORAL | 1 refills | Status: DC

## 2020-04-20 MED ORDER — LISINOPRIL 20 MG PO TABS: 20.0000 mg | ORAL_TABLET | Freq: Every day | ORAL | 1 refills | Status: DC

## 2020-04-20 MED ORDER — GEMFIBROZIL 600 MG PO TABS: 600.0000 mg | ORAL_TABLET | Freq: Two times a day (BID) | ORAL | 1 refills | Status: DC

## 2020-04-20 NOTE — Progress Notes (Signed)
[facility] [place], [place] [address]  Internal MEDICINE  Office Visit Note  Patient Name: Shawn Greene  [id number]  [id number]  Date of Service: 04/20/2020  Chief Complaint  Patient presents with  . 3 month follow up    Medication questions  . Diabetes  . Hyperlipidemia  . Hypertension  . Quality Metric Gaps    Eye exam    HPI Patient is here for routine follow-up Has fully recovered without complication form total right knee replacement  Having ongoing issues with erectile dysfunction, was switched to Viagra at last visit due to insurance no longer providing coverage for Cialis--Viagra provides no improvement with erectile dysfunction, at this point he is requesting referral to urology as this has been an ongoing problem and he would like to discuss permanent solutions  DM-Glucose levels have remained well controlled, checks levels at home a few times per week, only on glimepiride at this time  Reviewed recent labs--abnormal lipid panel (TG 242)--was started on Lopid, tolerating well Not on statin therapy, total CHO 165, HDL 30, LDL 94  Current Medication: Outpatient Encounter Medications as of 04/20/2020  Medication Sig  . blood glucose meter kit and supplies KIT Dispense based on patient and insurance preference. Use twice daily as directed  . glucose blood test strip Use as instructed to test blood sugar twice daily  . glyBURIDE (DIABETA) 5 MG tablet Take 1 tablet (5 mg total) by mouth daily with supper.  . Lancets 28G MISC by Does not apply route daily.  . tadalafil (CIALIS) 20 MG tablet Take 0.5-1 tablets (10-20 mg total) by mouth every other day as needed for erectile dysfunction.  . [DISCONTINUED] DULoxetine (CYMBALTA) 60 MG capsule Take 60 mg by mouth daily.  . [DISCONTINUED] gabapentin (NEURONTIN) 800 MG tablet Take 1 tablet (800 mg total) by mouth 3 (three) times daily.  . [DISCONTINUED] gemfibrozil (LOPID) 600 MG tablet Take 1 tablet (600  mg total) by mouth 2 (two) times daily before a meal.  . [DISCONTINUED] glimepiride (AMARYL) 2 MG tablet Take 1 tablet (2 mg total) by mouth daily with supper.  . [DISCONTINUED] lisinopril (ZESTRIL) 20 MG tablet Take 1 tablet (20 mg total) by mouth daily. Take one tab po qd  . [DISCONTINUED] metoprolol tartrate (LOPRESSOR) 50 MG tablet Take 1 tablet (50 mg total) by mouth 2 (two) times daily.  . [DISCONTINUED] omeprazole (PRILOSEC) 20 MG capsule Take 1 capsule (20 mg total) by mouth daily.  . [DISCONTINUED] sildenafil (VIAGRA) 100 MG tablet Take 1 tablet (100 mg total) by mouth daily as needed for erectile dysfunction.  . [DISCONTINUED] terbinafine (LAMISIL) 250 MG tablet Take 1 tablet (250 mg total) by mouth daily.  . DULoxetine (CYMBALTA) 60 MG capsule Take 1 capsule (60 mg total) by mouth daily.  Marland Kitchen gabapentin (NEURONTIN) 800 MG tablet Take 1 tablet (800 mg total) by mouth 3 (three) times daily.  Marland Kitchen gemfibrozil (LOPID) 600 MG tablet Take 1 tablet (600 mg total) by mouth 2 (two) times daily before a meal.  . lisinopril (ZESTRIL) 20 MG tablet Take 1 tablet (20 mg total) by mouth daily. Take one tab po qd  . metoprolol tartrate (LOPRESSOR) 50 MG tablet Take 1 tablet (50 mg total) by mouth 2 (two) times daily.  Marland Kitchen omeprazole (PRILOSEC) 20 MG capsule Take 1 capsule (20 mg total) by mouth daily.  Marland Kitchen terbinafine (LAMISIL) 250 MG tablet Take 1 tablet (250 mg total) by mouth daily.   No facility-administered encounter medications  on file as of 04/20/2020.    Surgical History: Past Surgical History:  Procedure Laterality Date  . ANKLE SURGERY    . COLECTOMY  2012    Medical History: Past Medical History:  Diagnosis Date  . Chronic tension-type headache, not intractable   . Essential (primary) hypertension   . Mixed hyperlipidemia   . Psoriasis   . Type 2 diabetes, controlled, with neuropathy ([place])   . 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 Hypertension Mother     Social History   Socioeconomic History  . Marital status: Married    Spouse name: Not on file  . Number of children: Not on file  . Years of education: Not on file  . Highest education level: Not on file  Occupational History  . Not on file  Tobacco Use  . Smoking status: Never Smoker  . Smokeless tobacco: Never Used  Substance and Sexual Activity  . Alcohol use: Yes    Alcohol/week: 1.0 standard drink    Types: 1 Cans of beer per week    Comment: occ  . Drug use: No  . Sexual activity: Not on file  Other Topics Concern  . Not on file  Social History Narrative  . Not on file   Social Determinants of Health   Financial Resource Strain: Not on file  Food Insecurity: Not on file  Transportation Needs: Not on file  Physical Activity: Not on file  Stress: Not on file  Social Connections: Not on file  Intimate Partner Violence: Not on file      Review of Systems  Constitutional: Negative for chills, fatigue and unexpected weight change.  HENT: Negative for congestion, postnasal drip, rhinorrhea, sneezing and sore throat.   Eyes: Negative for redness.  Respiratory: Negative for cough, chest tightness and shortness of breath.   Cardiovascular: Negative for chest pain and palpitations.  Gastrointestinal: Negative for abdominal pain, constipation, diarrhea, nausea and vomiting.  Genitourinary: Negative for dysuria and frequency.       Erectile dysfunction  Musculoskeletal: Negative for arthralgias, back pain, joint swelling and neck pain.  Skin: Negative for rash.  Neurological: Negative for tremors and numbness.  Hematological: Negative for adenopathy. Does not bruise/bleed easily.  Psychiatric/Behavioral: Negative for behavioral problems (Depression), sleep disturbance and suicidal ideas. The patient is not nervous/anxious.     Vital Signs: BP 137/79   Pulse 79   Temp (!) 97 F (36.1 C)   Resp 16   Ht 5' 9"   (1.753 m)   Wt 181 lb (82.1 kg)   SpO2 98%   BMI 26.73 kg/m    Physical Exam Vitals reviewed.  Constitutional:      Appearance: Normal appearance. He is normal weight.  Cardiovascular:     Rate and Rhythm: Normal rate and regular rhythm.     Pulses: Normal pulses.     Heart sounds: Normal heart sounds.  Pulmonary:     Effort: Pulmonary effort is normal.     Breath sounds: Normal breath sounds.  Abdominal:     General: Abdomen is flat.     Palpations: Abdomen is soft.  Musculoskeletal:        General: Normal range of motion.     Cervical back: Normal range of motion.  Skin:    General: Skin is warm.  Neurological:     General: No focal deficit present.     Mental Status:  He is alert and oriented to person, place, and time. Mental status is at baseline.  Psychiatric:        Mood and Affect: Mood normal.        Behavior: Behavior normal.        Thought Content: Thought content normal.        Judgment: Judgment normal.    Assessment/Plan: 1. Type 2 diabetes mellitus with diabetic neuropathy, without long-term current use of insulin ([facility]) A1C 6.7--well controlled Glimepiride no longer covered by insurance, will switch to Glyburide Encouraged to monitor glucose levels more frequently during switch of medications--continue with taking with largest meal of the day - POCT HgB A1C - DULoxetine (CYMBALTA) 60 MG capsule; Take 1 capsule (60 mg total) by mouth daily.  Dispense: 90 capsule; Refill: 1 - gabapentin (NEURONTIN) 800 MG tablet; Take 1 tablet (800 mg total) by mouth 3 (three) times daily.  Dispense: 270 tablet; Refill: 1 - glyBURIDE (DIABETA) 5 MG tablet; Take 1 tablet (5 mg total) by mouth daily with supper.  Dispense: 90 tablet; Refill: 2  2. Erectile dysfunction, unspecified erectile dysfunction type Viagra unsuccessful Cialis provided somewhat more of a relief of ED, will send to have while awaiting urology referral Willing to pay out of pocket with discount card -  Ambulatory referral to Urology - tadalafil (CIALIS) 20 MG tablet; Take 0.5-1 tablets (10-20 mg total) by mouth every other day as needed for erectile dysfunction.  Dispense: 5 tablet; Refill: 3  3. Mixed hyperlipidemia Closely monitor levels, consider addition of statin - gemfibrozil (LOPID) 600 MG tablet; Take 1 tablet (600 mg total) by mouth 2 (two) times daily before a meal.  Dispense: 180 tablet; Refill: 1  4. Essential hypertension, benign BP and HR well controlled, requesting refills - lisinopril (ZESTRIL) 20 MG tablet; Take 1 tablet (20 mg total) by mouth daily. Take one tab po qd  Dispense: 90 tablet; Refill: 1 - metoprolol tartrate (LOPRESSOR) 50 MG tablet; Take 1 tablet (50 mg total) by mouth 2 (two) times daily.  Dispense: 180 tablet; Refill: 1  5. Gastroesophageal reflux disease without esophagitis Symptoms well controlled, requesting refills - omeprazole (PRILOSEC) 20 MG capsule; Take 1 capsule (20 mg total) by mouth daily.  Dispense: 90 capsule; Refill: 1  6. Tinea unguium On month 2 of treatment, closely monitor liver function, discussed this medication is not to be used long term Showing improvement in nail fungus - terbinafine (LAMISIL) 250 MG tablet; Take 1 tablet (250 mg total) by mouth daily.  Dispense: 90 tablet; Refill: 2  General Counseling: Ch[place]udi verbalizes understanding of the findings of todays visit and agrees with plan of treatment. I have discussed any further diagnostic evaluation that may be needed or ordered today. We also reviewed his medications today. he has been encouraged to call the office with any questions or concerns that should arise related to todays visit.    Orders Placed This Encounter  Procedures  . Ambulatory referral to Urology  . POCT HgB A1C    Meds ordered this encounter  Medications  . tadalafil (CIALIS) 20 MG tablet    Sig: Take 0.5-1 tablets (10-20 mg total) by mouth every other day as needed for erectile dysfunction.     Dispense:  5 tablet    Refill:  3  . DULoxetine (CYMBALTA) 60 MG capsule    Sig: Take 1 capsule (60 mg total) by mouth daily.    Dispense:  90 capsule    Refill:  1  .  gabapentin (NEURONTIN) 800 MG tablet    Sig: Take 1 tablet (800 mg total) by mouth 3 (three) times daily.    Dispense:  270 tablet    Refill:  1  . gemfibrozil (LOPID) 600 MG tablet    Sig: Take 1 tablet (600 mg total) by mouth 2 (two) times daily before a meal.    Dispense:  180 tablet    Refill:  1  . glyBURIDE (DIABETA) 5 MG tablet    Sig: Take 1 tablet (5 mg total) by mouth daily with supper.    Dispense:  90 tablet    Refill:  2  . lisinopril (ZESTRIL) 20 MG tablet    Sig: Take 1 tablet (20 mg total) by mouth daily. Take one tab po qd    Dispense:  90 tablet    Refill:  1  . metoprolol tartrate (LOPRESSOR) 50 MG tablet    Sig: Take 1 tablet (50 mg total) by mouth 2 (two) times daily.    Dispense:  180 tablet    Refill:  1  . omeprazole (PRILOSEC) 20 MG capsule    Sig: Take 1 capsule (20 mg total) by mouth daily.    Dispense:  90 capsule    Refill:  1  . terbinafine (LAMISIL) 250 MG tablet    Sig: Take 1 tablet (250 mg total) by mouth daily.    Dispense:  90 tablet    Refill:  2    Time spent: 30 Minutes Time spent includes review of chart, medications, test results and follow-up plan with the patient.  This patient was seen by Theodoro Grist AGNP-C in Collaboration with Dr [name] as a part of collaborative care agreement     Tanna Furry. [facility] AGNP-C Internal medicine

## 2020-05-05 ENCOUNTER — Encounter: Payer: Self-pay | Admitting: Urology

## 2020-05-05 ENCOUNTER — Other Ambulatory Visit: Payer: Self-pay

## 2020-05-05 ENCOUNTER — Ambulatory Visit: Payer: PPO | Admitting: Urology

## 2020-05-05 VITALS — BP 128/65 | HR 95 | Ht 69.0 in | Wt 181.0 lb

## 2020-05-05 DIAGNOSIS — N5201 Erectile dysfunction due to arterial insufficiency: Secondary | ICD-10-CM | POA: Diagnosis not present

## 2020-05-05 DIAGNOSIS — N401 Enlarged prostate with lower urinary tract symptoms: Secondary | ICD-10-CM

## 2020-05-05 DIAGNOSIS — E349 Endocrine disorder, unspecified: Secondary | ICD-10-CM | POA: Diagnosis not present

## 2020-05-05 DIAGNOSIS — N500 Atrophy of testis: Secondary | ICD-10-CM

## 2020-05-05 DIAGNOSIS — R6882 Decreased libido: Secondary | ICD-10-CM

## 2020-05-05 DIAGNOSIS — R350 Frequency of micturition: Secondary | ICD-10-CM

## 2020-05-05 LAB — BLADDER SCAN AMB NON-IMAGING: Scan Result: 62

## 2020-05-05 MED ORDER — TAMSULOSIN HCL 0.4 MG PO CAPS: 0.4000 mg | ORAL_CAPSULE | Freq: Every day | ORAL | 0 refills | Status: DC

## 2020-05-05 NOTE — Progress Notes (Signed)
05/05/2020 1:51 PM   Shawn Greene [birth year] [id number]  Referring provider: Luiz Ochoa, NP [place],  [place] [address]  Chief Complaint  Patient presents with  . Erectile Dysfunction    HPI: Shawn Greene is a 70 y.o. male referred for evaluation of erectile dysfunction and lower urinary tract symptoms.   1 year history of erectile dysfunction  Difficulty achieving and maintaining an erection  Sildenafil was not effective and tadalafil partially effective though erections not firm enough for penetration  Complains of low libido  No pain or curvature with erections  Organic risk factors include diabetes, hypertension, hypercholesterolemia and antihypertensive medications  No prior tobacco use   Most bothersome voiding symptoms are nocturia x3-5  Some daytime frequency and decreased force and caliber of his urinary stream, sensation incomplete emptying and intermittent urinary stream  IPSS 22/35   Denies dysuria or gross hematuria  No flank, abdominal or pelvic pain  No history of sleep apnea; wife states he snores on occasions    PMH: Past Medical History:  Diagnosis Date  . Chronic tension-type headache, not intractable   . Essential (primary) hypertension   . Mixed hyperlipidemia   . Psoriasis   . Type 2 diabetes, controlled, with neuropathy ([place])   . 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 ANKLE SURGERY    . COLECTOMY  2012    Home Medications:  Allergies as of 05/05/2020      Reactions   Pollinex-t [modified Tree Tyrosine Adsorbate]       Medication List       Accurate as of May 05, 2020  1:51 PM. If you have any questions, ask your nurse or doctor.        blood glucose meter kit and supplies Kit Dispense based on patient and insurance preference. Use twice daily as directed   DULoxetine 60 MG capsule Commonly known as: CYMBALTA Take 1  capsule (60 mg total) by mouth daily.   gabapentin 800 MG tablet Commonly known as: NEURONTIN Take 1 tablet (800 mg total) by mouth 3 (three) times daily.   gemfibrozil 600 MG tablet Commonly known as: LOPID Take 1 tablet (600 mg total) by mouth 2 (two) times daily before a meal.   glucose blood test strip Use as instructed to test blood sugar twice daily   glyBURIDE 5 MG tablet Commonly known as: DIABETA Take 1 tablet (5 mg total) by mouth daily with supper.   Lancets 28G Misc by Does not apply route daily.   lisinopril 20 MG tablet Commonly known as: ZESTRIL Take 1 tablet (20 mg total) by mouth daily. Take one tab po qd   metoprolol tartrate 50 MG tablet Commonly known as: LOPRESSOR Take 1 tablet (50 mg total) by mouth 2 (two) times daily.   omeprazole 20 MG capsule Commonly known as: PRILOSEC Take 1 capsule (20 mg total) by mouth daily.   tadalafil 20 MG tablet Commonly known as: CIALIS Take 0.5-1 tablets (10-20 mg total) by mouth every other day as needed for erectile dysfunction.   terbinafine 250 MG tablet Commonly known as: LAMISIL Take 1 tablet (250 mg total) by mouth daily.       Allergies:  Allergies  Allergen Reactions  . Pollinex-T [Modified Tree Tyrosine Adsorbate]     Family History: Family History  Problem Relation Age of Onset  . Hypertension Mother     Social History:  reports that he has never smoked. He has never used smokeless tobacco. He reports current alcohol use of about 1.0 standard drink of alcohol per week. He reports that he does not use drugs.   Physical Exam: BP 128/65   Pulse 95   Ht 5' 9"  (1.753 m)   Wt 181 lb (82.1 kg)   BMI 26.73 kg/m   Constitutional:  Alert and oriented, No acute distress. HEENT: [place] AT, moist mucus membranes.  Trachea midline, no masses. Cardiovascular: No clubbing, cyanosis, or edema. Respiratory: Normal respiratory effort, no increased work of breathing. GI: Abdomen is soft, nontender,  nondistended, no abdominal masses GU: Phallus without lesions, bilateral testicular atrophy with estimated testicular volume ~ 10 cc bilaterally.  Prostate 40 g, smooth without nodules Lymph: No cervical or inguinal lymphadenopathy. Skin: No rashes, bruises or suspicious lesions. Neurologic: Grossly intact, no focal deficits, moving all 4 extremities. Psychiatric: Normal mood and affect.   Assessment & Plan:    1.  Erectile dysfunction  + Organic risk factors  Decreased libido with testicular atrophy  No significant improvement on PDE 5 inhibitor  Second line options were discussed including intracavernosal injections and vacuum erection devices.  He was provided literature  2.  Testicular atrophy  Bilateral testicular atrophy  Low libido  Schedule lab visit for a.m. testosterone level and LH  If testosterone level low we discussed a trial of TRT which would most likely not resolve his ED but he may see more efficacy using a PDE 5 inhibitor  3.  BPH with LUTS  Moderate-severe lower urinary tract symptoms  Symptoms bothersome enough that he would desire a trial of medication and Rx tamsulosin was sent to pharmacy  Bladder scan PVR was 62 cc   Abbie Sons, MD  [facility] [address] [place], [place] [address] [phone]

## 2020-05-06 ENCOUNTER — Encounter: Payer: Self-pay | Admitting: Urology

## 2020-05-06 DIAGNOSIS — R6882 Decreased libido: Secondary | ICD-10-CM | POA: Insufficient documentation

## 2020-05-06 DIAGNOSIS — N500 Atrophy of testis: Secondary | ICD-10-CM | POA: Insufficient documentation

## 2020-05-07 ENCOUNTER — Telehealth: Payer: Self-pay | Admitting: Internal Medicine

## 2020-05-07 NOTE — Progress Notes (Signed)
  Chronic Care Management   Note  05/07/2020 Name: Shawn Greene MRN: [id number] DOB: 05-30-1950  Shawn Greene is a 70 y.o. year old male who is a primary care patient of [name], MD. I reached out to Cassell Smiles by phone today in response to a referral sent by [name] Kary's PCP, [name], MD.   [name] was given information about Chronic Care Management services today including:  1. CCM service includes personalized support from designated clinical staff supervised by his physician, including individualized plan of care and coordination with other care providers 2. 24/7 contact phone numbers for assistance for urgent and routine care needs. 3. Service will only be billed when office clinical staff spend 20 minutes or more in a month to coordinate care. 4. Only one practitioner may furnish and bill the service in a calendar month. 5. The patient may stop CCM services at any time (effective at the end of the month) by phone call to the office staff.   Patient agreed to services and verbal consent obtained.   Follow up plan:   Carley Perdue [employer] Scheduler

## 2020-05-10 NOTE — Progress Notes (Signed)
Chronic Care Management Pharmacy Note  05/19/2020 Name:  Shawn Greene MRN:  [id number] DOB:  [birth year]  Subjective: Shawn Greene is an 70 y.o. year old male who is a primary patient of Humphrey Rolls Timoteo Gaul, MD.  The CCM team was consulted for assistance with disease management and care coordination needs.    Engaged with patient by telephone for initial visit in response to provider referral for pharmacy case management and/or care coordination services.   Consent to Services:  The patient was given the following information about Chronic Care Management services today, agreed to services, and gave verbal consent: 1. CCM service includes personalized support from designated clinical staff supervised by the primary care provider, including individualized plan of care and coordination with other care providers 2. 24/7 contact phone numbers for assistance for urgent and routine care needs. 3. Service will only be billed when office clinical staff spend 20 minutes or more in a month to coordinate care. 4. Only one practitioner may furnish and bill the service in a calendar month. 5.The patient may stop CCM services at any time (effective at the end of the month) by phone call to the office staff. 6. The patient will be responsible for cost sharing (co-pay) of up to 20% of the service fee (after annual deductible is met). Patient agreed to services and consent obtained.  Patient Care Team: [name], MD as PCP - General (Internal Medicine) Edythe Clarity, [facility] as Pharmacist (Pharmacist)  Recent office visits: 04/20/20 [facility]) - switched to glyburide due to insurance coverage, urology referral for ED  Recent consult visits: 05/05/20 [facility], Urology) - tamsulosin was sent to [facility] visits: None in previous 6 months  Objective:  Lab Results  Component Value Date   CREATININE 1.04 01/22/2020   BUN 21 01/22/2020   GFRNONAA 72 01/22/2020   GFRAA 84 01/22/2020   NA 140  01/22/2020   K 4.5 01/22/2020   CALCIUM 9.8 01/22/2020   CO2 20 01/22/2020   GLUCOSE 105 (H) 01/22/2020    Lab Results  Component Value Date/Time   HGBA1C 6.7 (A) 04/20/2020 09:08 AM   HGBA1C 6.6 (H) 01/22/2020 09:04 AM   HGBA1C 6.3 (A) 12/02/2019 08:52 AM    Last diabetic Eye exam: No results found for: HMDIABEYEEXA  Last diabetic Foot exam: No results found for: HMDIABFOOTEX   Lab Results  Component Value Date   CHOL 165 01/22/2020   HDL 30 (L) 01/22/2020   LDLCALC 94 01/22/2020   TRIG 242 (H) 01/22/2020    Hepatic Function Latest Ref Rng & Units 01/22/2020 01/29/2019 10/28/2018  Total Protein 6.0 - 8.5 g/dL 7.1 7.3 7.0  Albumin 3.8 - 4.8 g/dL 4.7 4.6 3.4(L)  AST 0 - 40 IU/L 40 34 34  ALT 0 - 44 IU/L 45(H) 39 28  Alk Phosphatase 44 - 121 IU/L 64 60 47  Total Bilirubin 0.0 - 1.2 mg/dL 0.5 0.5 1.3(H)    Lab Results  Component Value Date/Time   TSH 2.070 01/22/2020 09:04 AM   TSH 2.170 01/29/2019 09:01 AM   FREET4 1.03 01/22/2020 09:04 AM   FREET4 1.17 01/29/2019 09:01 AM    CBC Latest Ref Rng & Units 01/22/2020 01/29/2019 10/30/2018  WBC 3.4 - 10.8 x10E3/uL 5.7 5.5 8.2  Hemoglobin 13.0 - 17.7 g/dL 14.2 13.6 9.4(L)  Hematocrit 37.5 - 51.0 % 41.0 38.6 28.2(L)  Platelets 150 - 450 x10E3/uL 257 326 269    No results found for: VD25OH  Clinical  ASCVD: No  The 10-year ASCVD risk score Mikey Bussing DC Jr., et al., 2013) is: 40.5%   Values used to calculate the score:     Age: 33 years     Sex: Male     Is Non-Hispanic African American: No     Diabetic: Yes     Tobacco smoker: No     Systolic Blood Pressure: 510 mmHg     Is BP treated: Yes     HDL Cholesterol: 30 mg/dL     Total Cholesterol: 165 mg/dL    Depression screen [facility] 2/9 04/20/2020 01/21/2020 12/02/2019  Decreased Interest 0 0 0  Down, Depressed, Hopeless 0 0 0  PHQ - 2 Score 0 0 0     Social History   Tobacco Use  Smoking Status Never Smoker  Smokeless Tobacco Never Used   BP Readings from Last 3 Encounters:   05/05/20 128/65  04/20/20 137/79  03/04/20 121/63   Pulse Readings from Last 3 Encounters:  05/05/20 95  04/20/20 79  03/04/20 93   Wt Readings from Last 3 Encounters:  05/05/20 181 lb (82.1 kg)  04/20/20 181 lb (82.1 kg)  03/04/20 175 lb 3.2 oz (79.5 kg)   BMI Readings from Last 3 Encounters:  05/05/20 26.73 kg/m  04/20/20 26.73 kg/m  03/04/20 25.87 kg/m    Assessment/Interventions: Review of patient past medical history, allergies, medications, health status, including review of consultants reports, laboratory and other test data, was performed as part of comprehensive evaluation and provision of chronic care management services.   SDOH:  (Social Determinants of Health) assessments and interventions performed: Yes   Financial Resource Strain: Low Risk   . Difficulty of Paying Living Expenses: Not hard at all     CCM Care Plan  Allergies  Allergen Reactions  . Pollinex-T [Modified Tree Tyrosine Adsorbate]     Medications Reviewed Today    Reviewed by Edythe Clarity, [facility] (Pharmacist) on 05/19/20 at 1151  Med List Status: <None>  Medication Order Taking? Sig Documenting Provider Last Dose Status Informant  aspirin EC 81 MG tablet [id number] Yes Take 81 mg by mouth daily. Swallow whole. [provider] Taking Active   blood glucose meter kit and supplies KIT [id number] Yes Dispense based on patient and insurance preference. Use twice daily as directed Kendell Bane, NP Taking Active   DULoxetine (CYMBALTA) 60 MG capsule [id number] Yes Take 1 capsule (60 mg total) by mouth daily. Luiz Ochoa, NP Taking Active   gabapentin (NEURONTIN) 800 MG tablet [id number] Yes Take 1 tablet (800 mg total) by mouth 3 (three) times daily. Luiz Ochoa, NP Taking Active   gemfibrozil (LOPID) 600 MG tablet [id number] Yes Take 1 tablet (600 mg total) by mouth 2 (two) times daily before a meal. Luiz Ochoa, NP Taking Active   glucose blood test strip [id number] Yes  Use as instructed to test blood sugar twice daily Kendell Bane, NP Taking Active   glyBURIDE (DIABETA) 5 MG tablet [id number] Yes Take 1 tablet (5 mg total) by mouth daily with supper. Luiz Ochoa, NP Taking Active   Lancets 28G [place] [id number] Yes by Does not apply route daily. [provider] Taking Active Spouse/Significant Other  lisinopril (ZESTRIL) 20 MG tablet [id number] Yes Take 1 tablet (20 mg total) by mouth daily. Take one tab po qd Luiz Ochoa, NP Taking Active   metoprolol tartrate (LOPRESSOR) 50 MG tablet [id number] Yes Take 1 tablet (50 mg total) by mouth 2 (  two) times daily. Luiz Ochoa, NP Taking Active   omeprazole (PRILOSEC) 20 MG capsule [id number] Yes Take 1 capsule (20 mg total) by mouth daily. Luiz Ochoa, NP Taking Active   tadalafil (CIALIS) 20 MG tablet [id number] Yes Take 0.5-1 tablets (10-20 mg total) by mouth every other day as needed for erectile dysfunction. Luiz Ochoa, NP Taking Active   tamsulosin [facility]) 0.4 MG CAPS capsule [id number] Yes Take 1 capsule (0.4 mg total) by mouth daily. Abbie Sons, MD Taking Active   terbinafine (LAMISIL) 250 MG tablet [id number] Yes Take 1 tablet (250 mg total) by mouth daily. Luiz Ochoa, NP Taking Active           Patient Active Problem List   Diagnosis Date Noted  . Testicular atrophy 05/06/2020  . Low libido 05/06/2020  . Preop testing 01/21/2020  . Tinea unguium 01/21/2020  . Erectile dysfunction 01/21/2020  . Right bundle branch block 01/21/2020  . Sepsis ([place]) 10/29/2018  . Dyspnea on exertion 07/05/2018  . Gastroesophageal reflux disease without esophagitis 03/28/2018  . Hyperlipidemia 02/11/2018  . Primary osteoarthritis of both knees 02/11/2018  . Encounter for general adult medical examination with abnormal findings 06/05/2017  . Type 2 diabetes mellitus with diabetic neuropathy, without long-term current use of insulin ([place]) 06/05/2017  . Uncontrolled type 2 diabetes  mellitus with hyperglycemia ([place]) 06/05/2017  . Essential hypertension, benign 06/05/2017  . Benign prostatic hyperplasia with lower urinary tract symptoms 06/05/2017  . Dysuria 06/05/2017  . Hyperlipidemia, unspecified 03/07/2017  . Hypertension 03/07/2017  . Osteoarthritis of knee 02/07/2017  . Cervico-occipital neuralgia of right side 02/04/2014  . Headache, unspecified headache type 02/04/2014    Immunization History  Administered Date(s) Administered  . Influenza Inj Mdck Quad Pf 11/04/2019  . Influenza, High Dose Seasonal PF 11/09/2016, 11/15/2017, 10/16/2018  . Influenza,inj,Quad PF,6+ Mos 11/14/2014  . Influenza,inj,quad, With Preservative 11/14/2014  . Influenza-Unspecified 12/04/2016  . Pneumococcal Conjugate-13 06/18/2016  . Tdap 08/09/2018    Conditions to be addressed/monitored:  HTN, GERD, Type II DM, BPH, Osteoarthritis, HLD  Care Plan : General Pharmacy (Adult)  Updates made by Edythe Clarity, RPH since 05/19/2020 12:00 AM    Problem: HTN, GERD, Type II DM, BPH, Osteoarthritis, HLD   Priority: High  Onset Date: 05/19/2020    Long-Range Goal: Patient-Specific Goal   Start Date: 05/19/2020  Expected End Date: 11/19/2020  This Visit's Progress: On track  Priority: High  Note:   Current Barriers:  . No specific barriers identified at this visit.  Pharmacist Clinical Goal(s):  Marland Kitchen Patient will maintain control of blood sugar and BP as evidenced by home monitoring  . adhere to prescribed medication regimen as evidenced by fill dates . contact provider office for questions/concerns as evidenced notation of same in electronic health record through collaboration with PharmD and provider.   Interventions: . 1:1 collaboration with [name], MD regarding development and update of comprehensive plan of care as evidenced by provider attestation and co-signature . Inter-disciplinary care team collaboration (see longitudinal plan of care) . Comprehensive medication  review performed; medication list updated in electronic medical record  Hypertension (BP goal <130/80) -Controlled -Current treatment: . Lisinopril 63m daily . Metoprolol tartrate 534mtwice daily -Medications previously tried: none noted -Current home readings: 120-130/60-70s -Current dietary habits: patient eats "a little bit of everything".  Drinks water, coke or pepsi zero -Current exercise habits: currently doing rehabilitation for knee replacement.  Him and his wife also go various places  and walk quite often. -Denies hypotensive/hypertensive symptoms -Educated on BP goals and benefits of medications for prevention of heart attack, stroke and kidney damage; Importance of home blood pressure monitoring; Symptoms of hypotension and importance of maintaining adequate hydration; -Counseled to monitor BP at home a few times per week, document, and provide log at future appointments -Recommended to continue current medication  Hyperlipidemia: (LDL goal < 100, TG < 150) -Uncontrolled -Current treatment: . Gemfibrozil 645m daily -Medications previously tried: none noted  -Current dietary patterns: he is working on eating more lean meats.  He eats less steak now and more chicken.   -Current exercise habits: see above -Educated on Cholesterol goals;  Importance of limiting foods high in cholesterol; Value of exercise in controlling cholesterol -Counseled on diet and exercise extensively Recommended to continue current medication  Diabetes (A1c goal <7%) -Controlled -Current medications: . Glyburide 547mdaily with supper -Medications previously tried: glimepiride (insurance issues)  -Current home glucose readings . fasting glucose: reports only a few readings 150-160 . post prandial glucose: not taking -Denies hypoglycemic/hyperglycemic symptoms -Current meal patterns: did not provide -Current exercise: see above -Educated on A1c and blood sugar goals; Prevention and  management of hypoglycemic episodes; Benefits of routine self-monitoring of blood sugar;  -Discussed upward trend of A1c -Counseled to check feet daily and get yearly eye exams -Recommended to continue current medication Recommended to contact providers if FBG consistently > 130.  Follow up plan initiated for blood glucose monitoring/   GERD (Goal: Minimize symptoms) -Controlled -Current treatment  . Omeprazole 2068maily - PM -Medications previously tried: none noted -He denies any recent symptoms -Work on trigger foods  -Recommended to continue current medication  BPH (Goal: Minimize symptoms) -Not ideally controlled -Current treatment  . Tamsulosin 0.4mg37mily -Medications previously tried: none noted -Reports he is still getting up to use the bathroom a few times per night.  Feels as if he is not emptying fully.  Reports a "moderate" stream.  Taking medication appropriately in the AM.  Followed by urology.  Has appointment upcoming with them.  -Recommended to continue current medication   Patient Goals/Self-Care Activities . Patient will:  - take medications as prescribed check glucose daily, document, and provide at future appointments check blood pressure a few times per week, document, and provide at future appointments target a minimum of 150 minutes of moderate intensity exercise weekly  Follow Up Plan: The care management team will reach out to the patient again over the next 120 days.       Medication Assistance: None required.  Patient affirms current coverage meets needs.  Patient's preferred pharmacy is:  [facility]: [phone]: [phone]: [phone]: [address] [address]: [phone]: [phone] pill box? No - has one but has not started using it Pt endorses 100% compliance  We discussed: Benefits of medication synchronization, packaging and delivery as well as enhanced pharmacist oversight with [employer]. Patient decided to: Continue current medication management strategy  Care Plan and Follow Up Patient Decision:  Patient agrees to Care Plan and Follow-up.  Plan: The care management team will reach out to the patient again over the  next 120 days.  Beverly Milch, PharmD Clinical Pharmacist [place] [phone]

## 2020-05-11 ENCOUNTER — Other Ambulatory Visit: Payer: Self-pay

## 2020-05-11 DIAGNOSIS — R6882 Decreased libido: Secondary | ICD-10-CM

## 2020-05-13 ENCOUNTER — Telehealth: Payer: Self-pay | Admitting: Pharmacist

## 2020-05-13 NOTE — Progress Notes (Addendum)
° ° °  Chronic Care Management Pharmacy Assistant   Name: Shawn Greene  MRN: [id number] DOB: 1950-06-21  Reason for Encounter: Initial Questions   Medications: Outpatient Encounter Medications as of 05/13/2020  Medication Sig   blood glucose meter kit and supplies KIT Dispense based on patient and insurance preference. Use twice daily as directed   DULoxetine (CYMBALTA) 60 MG capsule Take 1 capsule (60 mg total) by mouth daily.   gabapentin (NEURONTIN) 800 MG tablet Take 1 tablet (800 mg total) by mouth 3 (three) times daily.   gemfibrozil (LOPID) 600 MG tablet Take 1 tablet (600 mg total) by mouth 2 (two) times daily before a meal.   glucose blood test strip Use as instructed to test blood sugar twice daily   glyBURIDE (DIABETA) 5 MG tablet Take 1 tablet (5 mg total) by mouth daily with supper.   Lancets 28G MISC by Does not apply route daily.   lisinopril (ZESTRIL) 20 MG tablet Take 1 tablet (20 mg total) by mouth daily. Take one tab po qd   metoprolol tartrate (LOPRESSOR) 50 MG tablet Take 1 tablet (50 mg total) by mouth 2 (two) times daily.   omeprazole (PRILOSEC) 20 MG capsule Take 1 capsule (20 mg total) by mouth daily.   tadalafil (CIALIS) 20 MG tablet Take 0.5-1 tablets (10-20 mg total) by mouth every other day as needed for erectile dysfunction.   tamsulosin (FLOMAX) 0.4 MG CAPS capsule Take 1 capsule (0.4 mg total) by mouth daily.   terbinafine (LAMISIL) 250 MG tablet Take 1 tablet (250 mg total) by mouth daily.   No facility-administered encounter medications on file as of 05/13/2020.    Have you seen any other providers since your last visit? Patient stated no.  Any changes in your medications or health? Urology   Any side effects from any medications? Patient stated no  Do you have an symptoms or problems not managed by your medications? Patient stated neuropathy.   Any concerns about your health right now? Patient stated his neuropathy.  Has your provider asked that you  check blood pressure, blood sugar, or follow special diet at home? Patient stated he checks his blood sugar twice daily.   Do you get any type of exercise on a regular basis? Patient stated he does yard work daily  Can you think of a goal you would like to reach for your health? Patient stated he would like better sugar levels and improve his neuropathy.  Do you have any problems getting your medications? Patient stated no.  Is there anything that you would like to discuss during the appointment? Patient stated no.  Please bring medications and supplements to appointment, patient reminded.  Follow-Up:Pharmacist Review  Charlann Lange, RMA Clinical Pharmacist Assistant [phone]  5 minutes spent in review, coordination, and documentation.  Reviewed by: Beverly Milch, PharmD Clinical Pharmacist [facility] [phone]

## 2020-05-14 ENCOUNTER — Other Ambulatory Visit: Payer: Self-pay

## 2020-05-14 ENCOUNTER — Other Ambulatory Visit: Payer: PPO

## 2020-05-14 DIAGNOSIS — N500 Atrophy of testis: Secondary | ICD-10-CM

## 2020-05-15 LAB — TESTOSTERONE: Testosterone: 378 ng/dL (ref 264–916)

## 2020-05-15 LAB — LUTEINIZING HORMONE: LH: 9.7 m[IU]/mL — ABNORMAL HIGH (ref 1.7–8.6)

## 2020-05-17 ENCOUNTER — Encounter: Payer: Self-pay | Admitting: *Deleted

## 2020-05-19 ENCOUNTER — Other Ambulatory Visit: Payer: Self-pay

## 2020-05-19 ENCOUNTER — Ambulatory Visit: Payer: PPO | Admitting: Pharmacist

## 2020-05-19 DIAGNOSIS — I10 Essential (primary) hypertension: Secondary | ICD-10-CM

## 2020-05-19 DIAGNOSIS — K219 Gastro-esophageal reflux disease without esophagitis: Secondary | ICD-10-CM

## 2020-05-19 DIAGNOSIS — E785 Hyperlipidemia, unspecified: Secondary | ICD-10-CM

## 2020-05-19 DIAGNOSIS — E1140 Type 2 diabetes mellitus with diabetic neuropathy, unspecified: Secondary | ICD-10-CM

## 2020-05-19 DIAGNOSIS — N401 Enlarged prostate with lower urinary tract symptoms: Secondary | ICD-10-CM

## 2020-05-19 NOTE — Patient Instructions (Addendum)
Visit Information  Goals Addressed            This Visit's Progress   . Manage My Medicine       Timeframe:  Long-Range Goal Priority:  High Start Date:    05/19/20                         Expected End Date:    11/19/20                   Follow Up Date 08/19/20   - call for medicine refill 2 or 3 days before it runs out - keep a list of all the medicines I take; vitamins and herbals too - use a pillbox to sort medicine    Why is this important?   . These steps will help you keep on track with your medicines.   Notes:       Patient Care Plan: General Pharmacy (Adult)    Problem Identified: HTN, GERD, Type II DM, BPH, Osteoarthritis, HLD   Priority: High  Onset Date: 05/19/2020    Long-Range Goal: Patient-Specific Goal   Start Date: 05/19/2020  Expected End Date: 11/19/2020  This Visit's Progress: On track  Priority: High  Note:   Current Barriers:  . No specific barriers identified at this visit.  Pharmacist Clinical Goal(s):  Marland Kitchen Patient will maintain control of blood sugar and BP as evidenced by home monitoring  . adhere to prescribed medication regimen as evidenced by fill dates . contact provider office for questions/concerns as evidenced notation of same in electronic health record through collaboration with PharmD and provider.   Interventions: . 1:1 collaboration with [name], MD regarding development and update of comprehensive plan of care as evidenced by provider attestation and co-signature . Inter-disciplinary care team collaboration (see longitudinal plan of care) . Comprehensive medication review performed; medication list updated in electronic medical record  Hypertension (BP goal <130/80) -Controlled -Current treatment: . Lisinopril 20mg  daily . Metoprolol tartrate 50mg  twice daily -Medications previously tried: none noted -Current home readings: 120-130/60-70s -Current dietary habits: patient eats "a little bit of everything".  Drinks water,  coke or pepsi zero -Current exercise habits: currently doing rehabilitation for knee replacement.  Him and his wife also go various places and walk quite often. -Denies hypotensive/hypertensive symptoms -Educated on BP goals and benefits of medications for prevention of heart attack, stroke and kidney damage; Importance of home blood pressure monitoring; Symptoms of hypotension and importance of maintaining adequate hydration; -Counseled to monitor BP at home a few times per week, document, and provide log at future appointments -Recommended to continue current medication  Hyperlipidemia: (LDL goal < 100, TG < 150) -Uncontrolled -Current treatment: . Gemfibrozil 600mg  daily -Medications previously tried: none noted  -Current dietary patterns: he is working on eating more lean meats.  He eats less steak now and more chicken.   -Current exercise habits: see above -Educated on Cholesterol goals;  Importance of limiting foods high in cholesterol; Value of exercise in controlling cholesterol -Counseled on diet and exercise extensively Recommended to continue current medication  Diabetes (A1c goal <7%) -Controlled -Current medications: . Glyburide 5mg  daily with supper -Medications previously tried: glimepiride (insurance issues)  -Current home glucose readings . fasting glucose: reports only a few readings 150-160 . post prandial glucose: not taking -Denies hypoglycemic/hyperglycemic symptoms -Current meal patterns: did not provide -Current exercise: see above -Educated on A1c and blood sugar goals; Prevention and management of  hypoglycemic episodes; Benefits of routine self-monitoring of blood sugar;  -Discussed upward trend of A1c -Counseled to check feet daily and get yearly eye exams -Recommended to continue current medication Recommended to contact providers if FBG consistently > 130.  Follow up plan initiated for blood glucose monitoring/   GERD (Goal: Minimize  symptoms) -Controlled -Current treatment  . Omeprazole 20mg  daily - PM -Medications previously tried: none noted -He denies any recent symptoms -Work on trigger foods  -Recommended to continue current medication  BPH (Goal: Minimize symptoms) -Not ideally controlled -Current treatment  . Tamsulosin 0.4mg  daily -Medications previously tried: none noted -Reports he is still getting up to use the bathroom a few times per night.  Feels as if he is not emptying fully.  Reports a "moderate" stream.  Taking medication appropriately in the AM.  Followed by urology.  Has appointment upcoming with them.  -Recommended to continue current medication   Patient Goals/Self-Care Activities . Patient will:  - take medications as prescribed check glucose daily, document, and provide at future appointments check blood pressure a few times per week, document, and provide at future appointments target a minimum of 150 minutes of moderate intensity exercise weekly  Follow Up Plan: The care management team will reach out to the patient again over the next 120 days.      [name] was given information about Chronic Care Management services today including:  1. CCM service includes personalized support from designated clinical staff supervised by his physician, including individualized plan of care and coordination with other care providers 2. 24/7 contact phone numbers for assistance for urgent and routine care needs. 3. Standard insurance, coinsurance, copays and deductibles apply for chronic care management only during months in which we provide at least 20 minutes of these services. Most insurances cover these services at 100%, however patients may be responsible for any copay, coinsurance and/or deductible if applicable. This service may help you avoid the need for more expensive face-to-face services. 4. Only one practitioner may furnish and bill the service in a calendar month. 5. The patient may  stop CCM services at any time (effective at the end of the month) by phone call to the office staff.  Patient agreed to services and verbal consent obtained.   The patient verbalized understanding of instructions, educational materials, and care plan provided today and agreed to receive a mailed copy of patient instructions, educational materials, and care plan.  Telephone follow up appointment with pharmacy team member scheduled for: 4 months  Shawn Greene, [facility]  High Cholesterol  High cholesterol is a condition in which the blood has high levels of a white, waxy substance similar to fat (cholesterol). The liver makes all the cholesterol that the body needs. The human body needs small amounts of cholesterol to help build cells. A person gets extra or excess cholesterol from the food that he or she eats. The blood carries cholesterol from the liver to the rest of the body. If you have high cholesterol, deposits (plaques) may build up on the walls of your arteries. Arteries are the blood vessels that carry blood away from your heart. These plaques make the arteries narrow and stiff. Cholesterol plaques increase your risk for heart attack and stroke. Work with your health care provider to keep your cholesterol levels in a healthy range. What increases the risk? The following factors may make you more likely to develop this condition:  Eating foods that are high in animal fat (saturated fat) or  cholesterol.  Being overweight.  Not getting enough exercise.  A family history of high cholesterol (familial hypercholesterolemia).  Use of tobacco products.  Having diabetes. What are the signs or symptoms? There are no symptoms of this condition. How is this diagnosed? This condition may be diagnosed based on the results of a blood test.  If you are older than 71 years of age, your health care provider may check your cholesterol levels every 4-6 years.  You may be checked more often if  you have high cholesterol or other risk factors for heart disease. The blood test for cholesterol measures:  "Bad" cholesterol, or LDL cholesterol. This is the main type of cholesterol that causes heart disease. The desired level is less than 100 mg/dL.  "Good" cholesterol, or HDL cholesterol. HDL helps protect against heart disease by cleaning the arteries and carrying the LDL to the liver for processing. The desired level for HDL is 60 mg/dL or higher.  Triglycerides. These are fats that your body can store or burn for energy. The desired level is less than 150 mg/dL.  Total cholesterol. This measures the total amount of cholesterol in your blood and includes LDL, HDL, and triglycerides. The desired level is less than 200 mg/dL. How is this treated? This condition may be treated with:  Diet changes. You may be asked to eat foods that have more fiber and less saturated fats or added sugar.  Lifestyle changes. These may include regular exercise, maintaining a healthy weight, and quitting use of tobacco products.  Medicines. These are given when diet and lifestyle changes have not worked. You may be prescribed a statin medicine to help lower your cholesterol levels. Follow these instructions at home: Eating and drinking  Eat a healthy, balanced diet. This diet includes: ? Daily servings of a variety of fresh, frozen, or canned fruits and vegetables. ? Daily servings of whole grain foods that are rich in fiber. ? Foods that are low in saturated fats and trans fats. These include poultry and fish without skin, lean cuts of meat, and low-fat dairy products. ? A variety of fish, especially oily fish that contain omega-3 fatty acids. Aim to eat fish at least 2 times a week.  Avoid foods and drinks that have added sugar.  Use healthy cooking methods, such as roasting, grilling, broiling, baking, poaching, steaming, and stir-frying. Do not fry your food except for stir-frying.    Lifestyle  Get regular exercise. Aim to exercise for a total of 150 minutes a week. Increase your activity level by doing activities such as gardening, walking, and taking the stairs.  Do not use any products that contain nicotine or tobacco, such as cigarettes, e-cigarettes, and chewing tobacco. If you need help quitting, ask your health care provider.   General instructions  Take over-the-counter and prescription medicines only as told by your health care provider.  Keep all follow-up visits as told by your health care provider. This is important. Where to find more information  American Heart Association: www.heart.org  National Heart, Lung, and Blood Institute: https://wilson-eaton.com/ Contact a health care provider if:  You have trouble achieving or maintaining a healthy diet or weight.  You are starting an exercise program.  You are unable to stop smoking. Get help right away if:  You have chest pain.  You have trouble breathing.  You have any symptoms of a stroke. "BE FAST" is an easy way to remember the main warning signs of a stroke: ? B - Balance. Signs  are dizziness, sudden trouble walking, or loss of balance. ? E - Eyes. Signs are trouble seeing or a sudden change in vision. ? F - Face. Signs are sudden weakness or numbness of the face, or the face or eyelid drooping on one side. ? A - Arms. Signs are weakness or numbness in an arm. This happens suddenly and usually on one side of the body. ? S - Speech. Signs are sudden trouble speaking, slurred speech, or trouble understanding what people say. ? T - Time. Time to call emergency services. Write down what time symptoms started.  You have other signs of a stroke, such as: ? A sudden, severe headache with no known cause. ? Nausea or vomiting. ? Seizure. These symptoms may represent a serious problem that is an emergency. Do not wait to see if the symptoms will go away. Get medical help right away. Call your local  emergency services (911 in the [place] Do not drive yourself to the hospital. Summary  Cholesterol plaques increase your risk for heart attack and stroke. Work with your health care provider to keep your cholesterol levels in a healthy range.  Eat a healthy, balanced diet, get regular exercise, and maintain a healthy weight.  Do not use any products that contain nicotine or tobacco, such as cigarettes, e-cigarettes, and chewing tobacco.  Get help right away if you have any symptoms of a stroke. This information is not intended to replace advice given to you by your health care provider. Make sure you discuss any questions you have with your health care provider. Document Revised: 01/06/2019 Document Reviewed: 01/06/2019 Elsevier Patient Education  2021 [employer].

## 2020-05-20 DIAGNOSIS — E1140 Type 2 diabetes mellitus with diabetic neuropathy, unspecified: Secondary | ICD-10-CM | POA: Diagnosis not present

## 2020-05-20 DIAGNOSIS — M21372 Foot drop, left foot: Secondary | ICD-10-CM | POA: Diagnosis not present

## 2020-05-20 DIAGNOSIS — I10 Essential (primary) hypertension: Secondary | ICD-10-CM | POA: Diagnosis not present

## 2020-06-04 IMAGING — MR MR LUMBAR SPINE WO/W CM
6 of 7 series · 32 of 48 positions shown · IV contrast (gadavist)
Comparison: None.

CLINICAL DATA: Lower back pain

EXAM:
MRI LUMBAR SPINE WITHOUT AND WITH CONTRAST
TECHNIQUE: Multiplanar and multiecho pulse sequences of the lumbar spine were
obtained without and with intravenous contrast.
CONTRAST:  8 mL Gadavist

[Series 5: T2 · sagittal · 4.0mm · 0.81mm/px · 4 of 17 slices shown (1 of 2)]
[im 1/17]
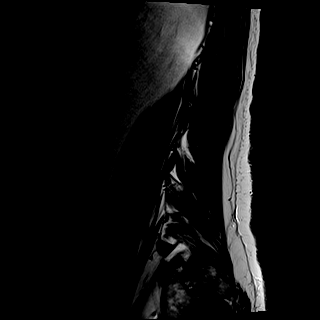
[im 6/17]
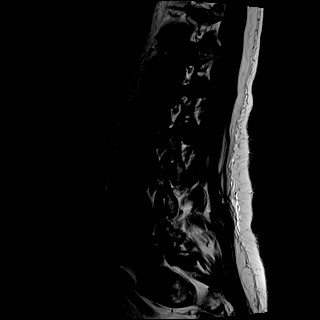
[im 11/17]
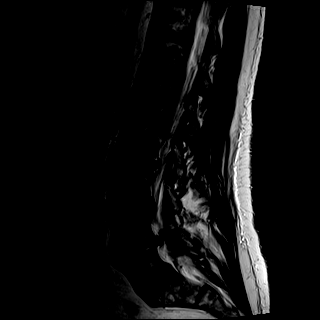
[im 17/17]
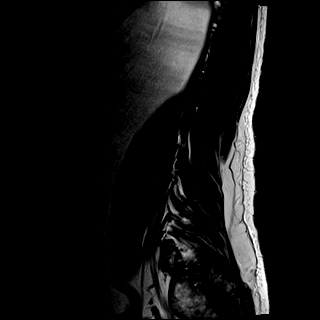

[Series 6: T1 · sagittal · 4.0mm · 0.81mm/px · 5 of 17 slices shown (1 of 2)]
[im 1/17]
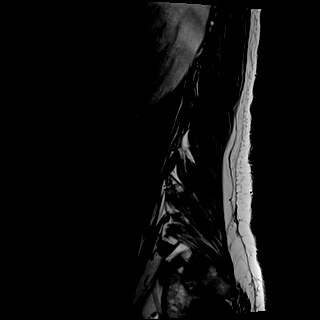
[im 5/17]
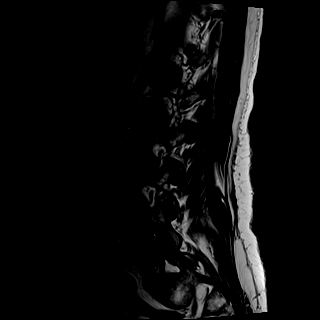
[im 9/17]
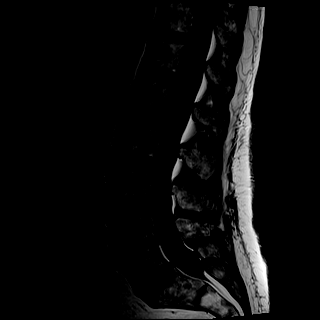
[im 13/17]
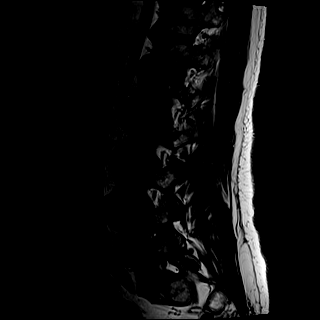
[im 17/17]
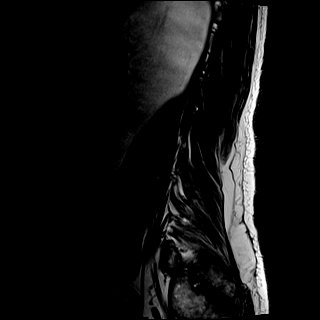

[Series 7: STIR · sagittal · 4.0mm · 0.51mm/px · 2 of 15 slices shown]
[im 1/15]
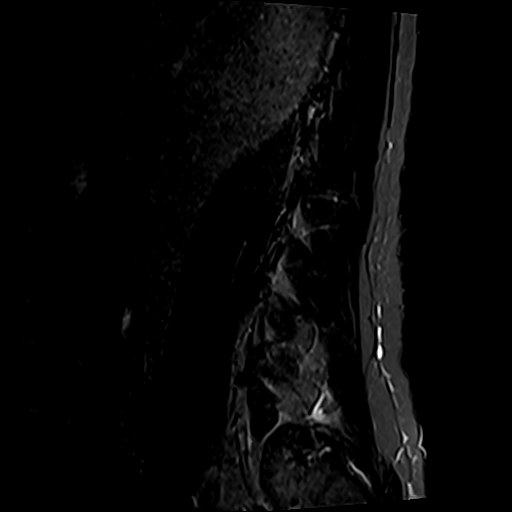
[im 5/15]
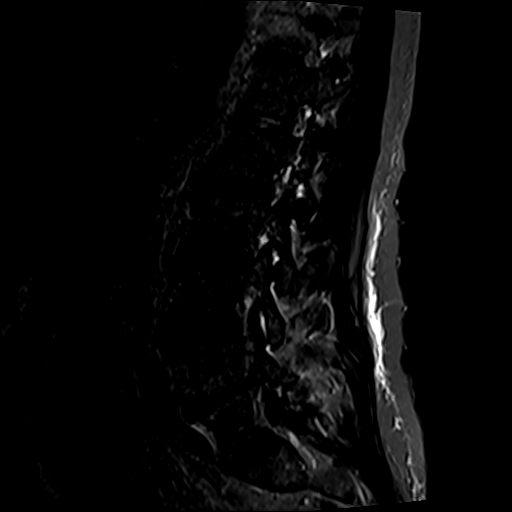

[Series 8: T2 · axial · 4.0mm · 0.78mm/px · z∈[-198,+18]mm · 8 of 36 slices shown (2 of 2)]
[im 1/36]
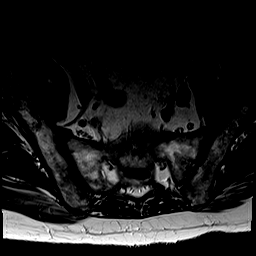
[im 4/36]
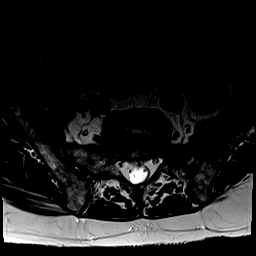
[im 12/36]
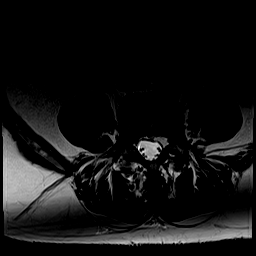
[im 16/36]
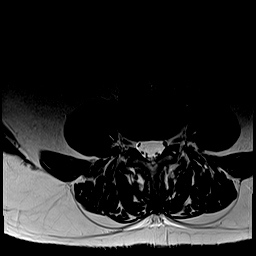
[im 20/36]
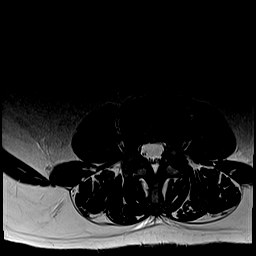
[im 24/36]
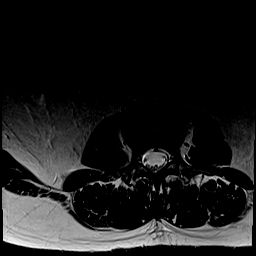
[im 32/36]
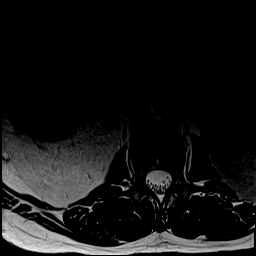
[im 36/36]
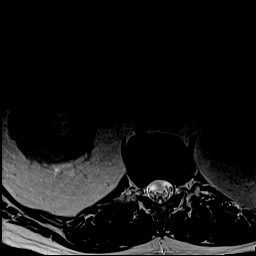

[Series 9: T1 · axial · 4.0mm · 0.39mm/px · z∈[-198,+18]mm · 8 of 36 slices shown (2 of 2)]
[im 1/36]
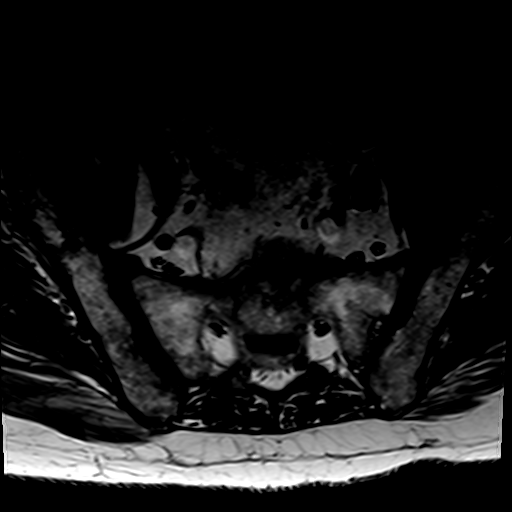
[im 4/36]
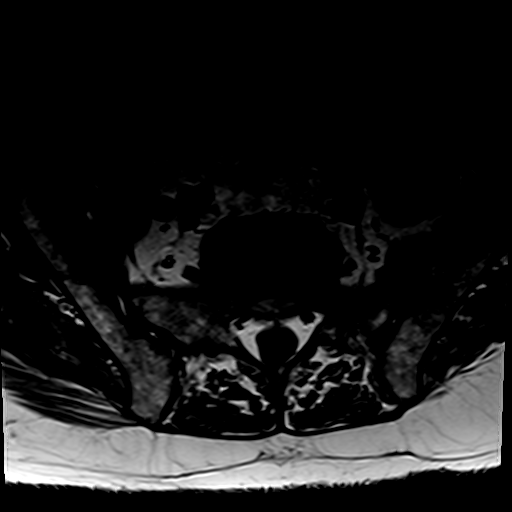
[im 12/36]
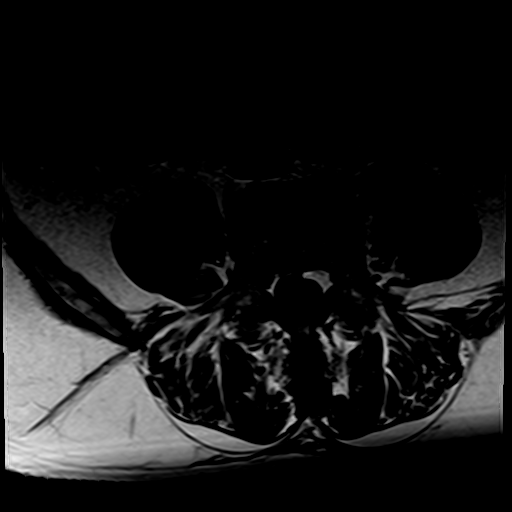
[im 16/36]
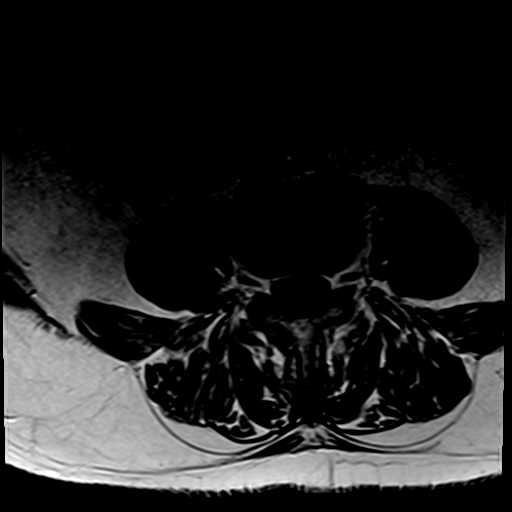
[im 20/36]
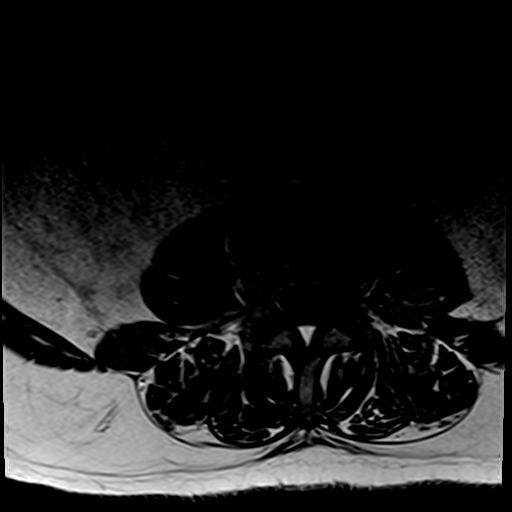
[im 24/36]
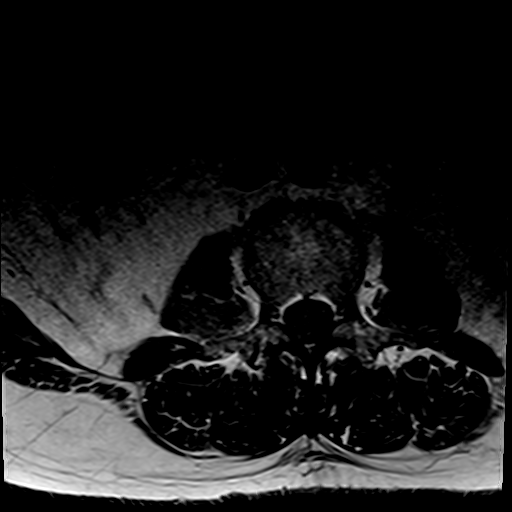
[im 32/36]
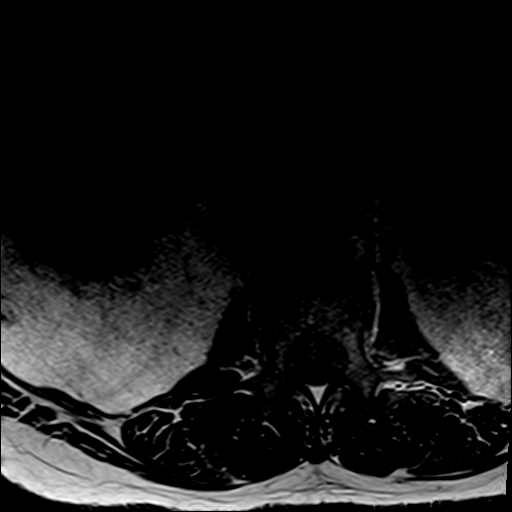
[im 36/36]
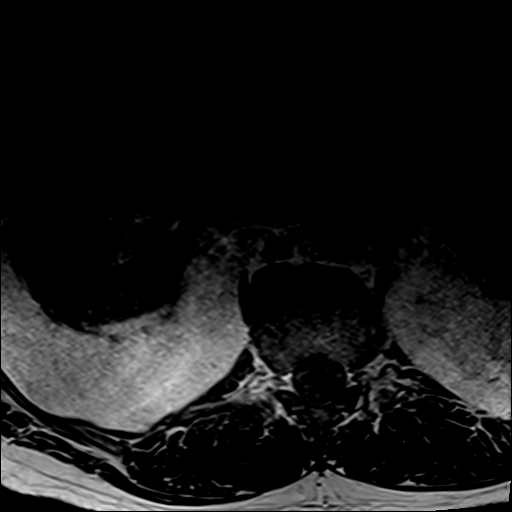

[Series 10: T1 fat-sat post-contrast · sagittal · 4.0mm · 0.81mm/px · 5 of 17 slices shown]
[im 1/17]
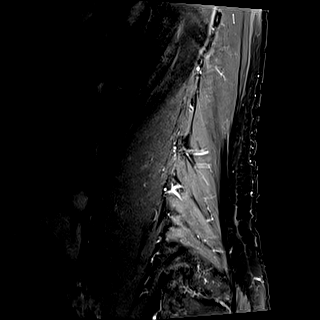
[im 5/17]
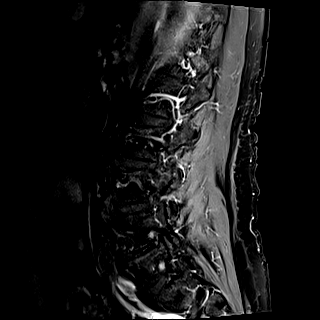
[im 9/17]
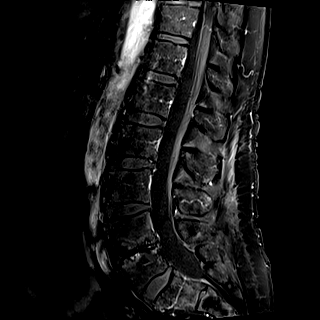
[im 13/17]
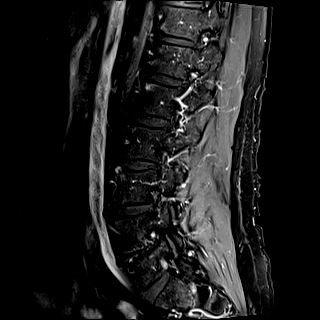
[im 17/17]
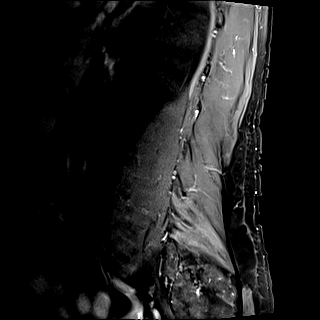

[32 of 48 positions shown; findings below may reference images not displayed]

FINDINGS: Segmentation: There are 5 non-rib bearing lumbar type vertebral
bodies. There is however a rudimentary disc seen at S1-S2 and the
last inter disc space is labeled as S1-S2.

Alignment:  Normal

Vertebrae: The vertebral body heights are well maintained. No
fracture, marrow edema,or pathologic marrow infiltration. There is
mildly increased STIR signal with endplate reactive changes at
L5-S1. Small anterior osteophytes seen throughout the lumbar spine.
There is slightly heterogeneous appearance to the marrow.

Conus medullaris and cauda equina: Conus extends to the L1 level.
Conus and cauda equina appear normal. No abnormal intramedullary or
leptomeningeal enhancement is seen.

Paraspinal and other soft tissues: The paraspinal soft tissues and
visualized retroperitoneal structures are unremarkable. The
sacroiliac joints are intact. There is mildly enhancing paraspinal
soft tissues at L3-L4 and inter spinous soft tissues at L3-L4. No
enhancing loculated fluid collection.

Disc levels:

T12-L1:  No significant canal or neural foraminal narrowing.

L1-L2:   No significant canal or neural foraminal narrowing.

L2-L3: There is a minimal broad-based disc bulge, however no
significant canal or neural foraminal narrowing.

L3-L4: There is a broad-based disc bulge with facet arthrosis which
causes mild bilateral neural foraminal narrowing

L4-L5: There is a broad-based disc bulge with facet arthrosis which
causes mild bilateral neural foraminal narrowing.

L5-S1: There is a broad-based disc bulge with facet arthrosis and a
central disc protrusion which contacts the bilateral descending S1
nerve roots. There severe bilateral neural foraminal narrowing.
IMPRESSION: 1. For the purposes of this dictation the last inter disc space is
labeled as S1-S2.
2. Lumbar spine spondylosis most notable at L5-S1 with a central
disc protrusion and severe bilateral neural foraminal narrowing.
3. Mild enhancing soft tissue edema seen within the paraspinal soft
tissues and intraspinous ligaments at L4-L5, likely from recent
injection.

## 2020-06-12 ENCOUNTER — Observation Stay
Admission: EM | Admit: 2020-06-12 | Discharge: 2020-06-13 | Disposition: A | Discharge status: Home / Self Care | Payer: PPO | Attending: Internal Medicine | Admitting: Internal Medicine

## 2020-06-12 ENCOUNTER — Encounter: Payer: Self-pay | Admitting: Emergency Medicine

## 2020-06-12 ENCOUNTER — Other Ambulatory Visit: Payer: Self-pay

## 2020-06-12 DIAGNOSIS — E1140 Type 2 diabetes mellitus with diabetic neuropathy, unspecified: Secondary | ICD-10-CM | POA: Diagnosis not present

## 2020-06-12 DIAGNOSIS — R55 Syncope and collapse: Secondary | ICD-10-CM

## 2020-06-12 DIAGNOSIS — Z7982 Long term (current) use of aspirin: Secondary | ICD-10-CM | POA: Insufficient documentation

## 2020-06-12 DIAGNOSIS — K219 Gastro-esophageal reflux disease without esophagitis: Secondary | ICD-10-CM | POA: Diagnosis not present

## 2020-06-12 DIAGNOSIS — Z20822 Contact with and (suspected) exposure to covid-19: Secondary | ICD-10-CM | POA: Diagnosis not present

## 2020-06-12 DIAGNOSIS — N529 Male erectile dysfunction, unspecified: Secondary | ICD-10-CM | POA: Diagnosis present

## 2020-06-12 DIAGNOSIS — Z7984 Long term (current) use of oral hypoglycemic drugs: Secondary | ICD-10-CM | POA: Insufficient documentation

## 2020-06-12 DIAGNOSIS — N179 Acute kidney failure, unspecified: Secondary | ICD-10-CM | POA: Diagnosis not present

## 2020-06-12 DIAGNOSIS — E1165 Type 2 diabetes mellitus with hyperglycemia: Secondary | ICD-10-CM | POA: Insufficient documentation

## 2020-06-12 DIAGNOSIS — Z79899 Other long term (current) drug therapy: Secondary | ICD-10-CM | POA: Diagnosis not present

## 2020-06-12 DIAGNOSIS — I152 Hypertension secondary to endocrine disorders: Secondary | ICD-10-CM | POA: Diagnosis present

## 2020-06-12 DIAGNOSIS — E785 Hyperlipidemia, unspecified: Secondary | ICD-10-CM | POA: Diagnosis present

## 2020-06-12 DIAGNOSIS — T675XXA Heat exhaustion, unspecified, initial encounter: Secondary | ICD-10-CM | POA: Diagnosis not present

## 2020-06-12 DIAGNOSIS — I10 Essential (primary) hypertension: Secondary | ICD-10-CM | POA: Diagnosis not present

## 2020-06-12 DIAGNOSIS — E782 Mixed hyperlipidemia: Secondary | ICD-10-CM

## 2020-06-12 DIAGNOSIS — R42 Dizziness and giddiness: Secondary | ICD-10-CM | POA: Diagnosis present

## 2020-06-12 LAB — CBC WITH DIFFERENTIAL/PLATELET
Abs Immature Granulocytes: 0.03 10*3/uL (ref 0.00–0.07)
Basophils Absolute: 0.1 10*3/uL (ref 0.0–0.1)
Basophils Relative: 1 %
Eosinophils Absolute: 0.2 10*3/uL (ref 0.0–0.5)
Eosinophils Relative: 3 %
HCT: 38.2 % — ABNORMAL LOW (ref 39.0–52.0)
Hemoglobin: 12.8 g/dL — ABNORMAL LOW (ref 13.0–17.0)
Immature Granulocytes: 1 %
Lymphocytes Relative: 24 %
Lymphs Abs: 1.5 10*3/uL (ref 0.7–4.0)
MCH: 29.4 pg (ref 26.0–34.0)
MCHC: 33.5 g/dL (ref 30.0–36.0)
MCV: 87.6 fL (ref 80.0–100.0)
Monocytes Absolute: 0.9 10*3/uL (ref 0.1–1.0)
Monocytes Relative: 15 %
Neutro Abs: 3.5 10*3/uL (ref 1.7–7.7)
Neutrophils Relative %: 56 %
Platelets: 336 10*3/uL (ref 150–400)
RBC: 4.36 MIL/uL (ref 4.22–5.81)
RDW: 12.9 % (ref 11.5–15.5)
WBC: 6.3 10*3/uL (ref 4.0–10.5)
nRBC: 0 % (ref 0.0–0.2)

## 2020-06-12 LAB — TROPONIN I (HIGH SENSITIVITY)
Troponin I (High Sensitivity): 5 ng/L (ref <18)
Troponin I (High Sensitivity): 4 ng/L (ref <18)

## 2020-06-12 LAB — BASIC METABOLIC PANEL
Anion gap: 12 (ref 5–15)
BUN: 29 mg/dL — ABNORMAL HIGH (ref 8–23)
CO2: 22 mmol/L (ref 22–32)
Calcium: 9.5 mg/dL (ref 8.9–10.3)
Chloride: 104 mmol/L (ref 98–111)
Creatinine, Ser: 2.22 mg/dL — ABNORMAL HIGH (ref 0.61–1.24)
GFR, Estimated: 31 mL/min — ABNORMAL LOW (ref >60)
Glucose, Bld: 227 mg/dL — ABNORMAL HIGH (ref 70–99)
Potassium: 4.7 mmol/L (ref 3.5–5.1)
Sodium: 138 mmol/L (ref 135–145)

## 2020-06-12 LAB — RESP PANEL BY RT-PCR (FLU A&B, COVID) ARPGX2
Influenza A by PCR: NEGATIVE
Influenza B by PCR: NEGATIVE
SARS Coronavirus 2 by RT PCR: NEGATIVE

## 2020-06-12 MED ORDER — SODIUM CHLORIDE 0.9 % IV BOLUS
1000.0000 mL | Freq: Once | INTRAVENOUS | Status: AC
Start: 2020-06-12 — End: 2020-06-12
  Administered 2020-06-12: 1000 mL via INTRAVENOUS

## 2020-06-12 MED ORDER — LACTATED RINGERS IV SOLN: INTRAVENOUS | Status: AC

## 2020-06-12 NOTE — ED Notes (Signed)
Request made for transport to the floor ?

## 2020-06-12 NOTE — H&P (Signed)
History and Physical    Shawn Greene KCM:[id number] DOB: 01/13/1951 DOA: 06/12/2020  PCP: [name], MD  Patient coming from: Home, wife at bedside  I have personally briefly reviewed patient's old medical records in [place]  Chief Complaint: dizziness  HPI: Shawn Greene is a 70 y.o. male with medical history significant for hypertension, RBBB, type 2 diabetes, hyperlipidemia who presents with concerns of dizziness.  Patient was out mowing the lawn today from around noon to 4:00PM and sweated significantly.  He then went home and took a shower and started to feel dizzy with ambulation.  He went out to lunch with his wife and getting out the car he was unsteady on his feet due to feeling dizzy and lightheaded.  He denies any chest pain, shortness of breath or palpitations.  He went home after dinner and took some blood pressure reading on his wife's machine and saw systolic of 80 and later 60.  He took his antihypertensives this morning.  Patient is a diabetic but is not on insulin and states his sugars usually run high.  Wife also has questions about erectile dysfunction and whether this is related.  States he has tried Viagra without much change. Pt has desire but feeling depressed due to ED.   ED Course: He was afebrile, normotensive on room air.  CBC showed no leukocytosis.  Hemoglobin of 12.8.  Creatinine of 2.22 from prior 1.0 4.  BG of 227.  Troponin of 5 and 4. EKG showing sinus rhythm with RBBB  Review of Systems: Constitutional: No Weight Change, No Fever ENT/Mouth: No sore throat, No Rhinorrhea Eyes: No Eye Pain, No Vision Changes Cardiovascular: No Chest Pain, no SOB, No Palpitations Respiratory: No Cough, No Sputum Gastrointestinal: No Nausea, No Vomiting, No Diarrhea Genitourinary: no Urinary Incontinence Musculoskeletal: No Arthralgias, No Myalgias Skin: No Skin Lesions, No Pruritus, Neuro: no Weakness, No Numbness Psych: No Anxiety/Panic, No Depression, no  decrease appetite Heme/Lymph: No Bruising, No Bleeding   Past Medical History:  Diagnosis Date  . Chronic tension-type headache, not intractable   . Essential (primary) hypertension   . Mixed hyperlipidemia   . Psoriasis   . Type 2 diabetes, controlled, with neuropathy ([place])   . 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 ANKLE SURGERY    . COLECTOMY  2012     reports that he has never smoked. He has never used smokeless tobacco. He reports current alcohol use of about 1.0 standard drink of alcohol per week. He reports that he does not use drugs. Social History  Allergies  Allergen Reactions  . Pollinex-T [Modified Tree Tyrosine Adsorbate]     Family History  Problem Relation Age of Onset  . Hypertension Mother      Prior to Admission medications   Medication Sig Start Date End Date Taking? Authorizing Provider  aspirin EC 81 MG tablet Take 81 mg by mouth daily. Swallow whole.    [provider]  blood glucose meter kit and supplies KIT Dispense based on patient and insurance preference. Use twice daily as directed 09/05/19   Kendell Bane, NP  DULoxetine (CYMBALTA) 60 MG capsule Take 1 capsule (60 mg total) by mouth daily. 04/20/20   Luiz Ochoa, NP  gabapentin (NEURONTIN) 800 MG tablet Take 1 tablet (800 mg total) by mouth 3 (three) times daily. 04/20/20   Luiz Ochoa, NP  gemfibrozil (LOPID) 600 MG  tablet Take 1 tablet (600 mg total) by mouth 2 (two) times daily before a meal. 04/20/20   Luiz Ochoa, NP  glucose blood test strip Use as instructed to test blood sugar twice daily 09/05/19   Kendell Bane, NP  glyBURIDE (DIABETA) 5 MG tablet Take 1 tablet (5 mg total) by mouth daily with supper. 04/20/20   Luiz Ochoa, NP  Lancets 28G MISC by Does not apply route daily.    [provider]  lisinopril (ZESTRIL) 20 MG tablet Take 1 tablet (20 mg total) by mouth daily. Take one tab po qd  04/20/20   Luiz Ochoa, NP  metoprolol tartrate (LOPRESSOR) 50 MG tablet Take 1 tablet (50 mg total) by mouth 2 (two) times daily. 04/20/20   Luiz Ochoa, NP  omeprazole (PRILOSEC) 20 MG capsule Take 1 capsule (20 mg total) by mouth daily. 04/20/20   Luiz Ochoa, NP  tadalafil (CIALIS) 20 MG tablet Take 0.5-1 tablets (10-20 mg total) by mouth every other day as needed for erectile dysfunction. 04/20/20   Luiz Ochoa, NP  tamsulosin (FLOMAX) 0.4 MG CAPS capsule Take 1 capsule (0.4 mg total) by mouth daily. 05/05/20   Stoioff, Ronda Fairly, MD  terbinafine (LAMISIL) 250 MG tablet Take 1 tablet (250 mg total) by mouth daily. 04/20/20   Luiz Ochoa, NP    Physical Exam: Vitals:   06/12/20 1849 06/12/20 1850 06/12/20 2000 06/12/20 2100  BP: (!) 115/51  (!) 105/48 115/63  Pulse: 89  80 82  Resp: 20  19 (!) 23  Temp: 97.8 F (36.6 C)     TempSrc: Oral     SpO2: 97%  98% 100%  Weight:  81.6 kg    Height:  5' 9" (1.753 m)      Constitutional: NAD, calm, comfortable, well-appearing elderly gentleman sitting upright in bed Vitals:   06/12/20 1849 06/12/20 1850 06/12/20 2000 06/12/20 2100  BP: (!) 115/51  (!) 105/48 115/63  Pulse: 89  80 82  Resp: 20  19 (!) 23  Temp: 97.8 F (36.6 C)     TempSrc: Oral     SpO2: 97%  98% 100%  Weight:  81.6 kg    Height:  5' 9" (1.753 m)     Eyes: PERRL, lids and conjunctivae normal ENMT: Mucous membranes are moist.  Neck: normal, supple Respiratory: clear to auscultation bilaterally, no wheezing, no crackles. Normal respiratory effort. No accessory muscle use.  Cardiovascular: Regular rate and rhythm, no murmurs / rubs / gallops. No extremity edema. Abdomen: no tenderness, no masses palpated. . Bowel sounds positive.  Musculoskeletal: no clubbing / cyanosis. No joint deformity upper and lower extremities. Good ROM, no contractures. Normal muscle tone.  Skin: no rashes, lesions, ulcers. No induration Neurologic: CN 2-12 grossly intact.  Sensation intact, Strength 5/5 in all 4.  Psychiatric: Normal judgment and insight. Alert and oriented x 3. Normal mood.     Labs on Admission: I have personally reviewed following labs and imaging studies  CBC: Recent Labs  Lab 06/12/20 1928  WBC 6.3  NEUTROABS 3.5  HGB 12.8*  HCT 38.2*  MCV 87.6  PLT 462   Basic Metabolic Panel: Recent Labs  Lab 06/12/20 1928  NA 138  K 4.7  CL 104  CO2 22  GLUCOSE 227*  BUN 29*  CREATININE 2.22*  CALCIUM 9.5   GFR: Estimated Creatinine Clearance: 31 mL/min (A) (by C-G formula based on SCr of 2.22 mg/dL (H)). Liver  Function Tests: No results for input(s): AST, ALT, ALKPHOS, BILITOT, PROT, ALBUMIN in the last 168 hours. No results for input(s): LIPASE, AMYLASE in the last 168 hours. No results for input(s): AMMONIA in the last 168 hours. Coagulation Profile: No results for input(s): INR, PROTIME in the last 168 hours. Cardiac Enzymes: No results for input(s): CKTOTAL, CKMB, CKMBINDEX, TROPONINI in the last 168 hours. BNP (last 3 results) No results for input(s): PROBNP in the last 8760 hours. HbA1C: No results for input(s): HGBA1C in the last 72 hours. CBG: No results for input(s): GLUCAP in the last 168 hours. Lipid Profile: No results for input(s): CHOL, HDL, LDLCALC, TRIG, CHOLHDL, LDLDIRECT in the last 72 hours. Thyroid Function Tests: No results for input(s): TSH, T4TOTAL, FREET4, T3FREE, THYROIDAB in the last 72 hours. Anemia Panel: No results for input(s): VITAMINB12, FOLATE, FERRITIN, TIBC, IRON, RETICCTPCT in the last 72 hours. Urine analysis:    Component Value Date/Time   COLORURINE YELLOW (A) 10/28/2018 2116   APPEARANCEUR Clear 01/21/2020 1207   LABSPEC 1.008 10/28/2018 2116   PHURINE 6.0 10/28/2018 2116   GLUCOSEU Negative 01/21/2020 1207   HGBUR NEGATIVE 10/28/2018 2116   BILIRUBINUR Negative 01/21/2020 [place] 10/28/2018 2116   PROTEINUR Negative 01/21/2020 [place] NEGATIVE  10/28/2018 2116   NITRITE Negative 01/21/2020 1207   NITRITE NEGATIVE 10/28/2018 2116   LEUKOCYTESUR Negative 01/21/2020 1207   LEUKOCYTESUR NEGATIVE 10/28/2018 2116    Radiological Exams on Admission: No results found.    Assessment/Plan  Pre-syncope likely secondary to hypovolemia/dehydration Patient had symptoms after 4 hours in the heat mowing the lawn -Reports resolution of symptoms following IV fluids in the ED -Keep on continuous telemetry -Troponin reassuring at 5 and 4 -Continuous IV fluids  AKI -Creatinine of 2.22 from a prior of 1.04 -Prerenal azotemia from hypovolemia - Continue IV fluids.  Repeat BMP in the morning.  Hypertension Patient reported hypotension at home but blood pressure has remained stable here -Continue metoprolol, hold lisinopril due to AKI  Type 2 diabetes with hyperglycemia Hemoglobin of A1c of 6.7 in March place on sensitive sliding scale -continue cymbalta   GERD continue PPI  HLD continue gemfibrozil   Erectile dysfunction recommended speaking with primary physician in switching metoprolol  also could consider counseling/therapy  DVT prophylaxis:.SCDs Code Status: Full Family Communication: Plan discussed with patient and wife at bedside  disposition Plan: Home with observation Consults called:  Admission status: Observation  Level of care: Med-Surg  Status is: Observation  The patient remains OBS appropriate and will d/c before 2 midnights.  Dispo: The patient is from: Home              Anticipated d/c is to: Home              Patient currently is not medically stable to d/c.   Difficult to place patient No         Orene Desanctis DO [facility]   If 7PM-7AM, please contact night-coverage [email or web address]   06/12/2020, 9:54 PM

## 2020-06-12 NOTE — ED Triage Notes (Addendum)
Triage done by this RN documented by mistake

## 2020-06-12 NOTE — ED Triage Notes (Signed)
Pt c/o dizziness that started today after working out in the yard. Pt A&O x4, ambulatory to treatment room.

## 2020-06-12 NOTE — ED Provider Notes (Signed)
[facility] Emergency Department Provider Note ____________________________________________   Event Date/Time   First MD Initiated Contact with Patient 06/12/20 1853     (approximate)  I have reviewed the triage vital signs and the nursing notes.   HISTORY  Chief Complaint Dizziness and Emesis    HPI Shawn Greene is a 70 y.o. male with PMH as noted below including hypertension and diabetes who presents with dizziness, cute onset few hours ago, described as lightheadedness and feel like he is going to "fall out."  The patient states that he was working outside from around 11 AM to 4 PM mowing the lawn and doing other yard work.  He felt well throughout that time.  He came inside, took a shower, and then started to feel lightheaded.  He states that he and his wife checked his blood pressure at home a few times and it was in the 80s so they became concerned.  He states he is now feeling better.  He denies any associated chest pain, shortness of breath, palpitations, vomiting or diarrhea, abdominal pain, headache, or any other acute symptoms.    Past Medical History:  Diagnosis Date  . Chronic tension-type headache, not intractable   . Essential (primary) hypertension   . Mixed hyperlipidemia   . Psoriasis   . Type 2 diabetes, controlled, with neuropathy ([place])   . 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 Pre-syncope 06/12/2020  . Testicular atrophy 05/06/2020  . Low libido 05/06/2020  . Preop testing 01/21/2020  . Tinea unguium 01/21/2020  . Erectile dysfunction 01/21/2020  . Right bundle branch block 01/21/2020  . Sepsis ([place]) 10/29/2018  . Dyspnea on exertion 07/05/2018  . Gastroesophageal reflux disease without esophagitis 03/28/2018  . Hyperlipidemia 02/11/2018  . Primary osteoarthritis of both knees 02/11/2018  . Encounter for general adult medical examination with abnormal findings  06/05/2017  . Type 2 diabetes mellitus with diabetic neuropathy, without long-term current use of insulin ([place]) 06/05/2017  . Uncontrolled type 2 diabetes mellitus with hyperglycemia ([place]) 06/05/2017  . Essential hypertension, benign 06/05/2017  . Benign prostatic hyperplasia with lower urinary tract symptoms 06/05/2017  . Dysuria 06/05/2017  . Hyperlipidemia, unspecified 03/07/2017  . Hypertension 03/07/2017  . Osteoarthritis of knee 02/07/2017  . Cervico-occipital neuralgia of right side 02/04/2014  . Headache, unspecified headache type 02/04/2014    Past Surgical History:  Procedure Laterality Date  . ANKLE SURGERY    . COLECTOMY  2012    Prior to Admission medications   Medication Sig Start Date End Date Taking? Authorizing Provider  aspirin EC 81 MG tablet Take 81 mg by mouth daily. Swallow whole.   Yes [provider]  blood glucose meter kit and supplies KIT Dispense based on patient and insurance preference. Use twice daily as directed 09/05/19  Yes [place], Audie Clear, NP  DULoxetine (CYMBALTA) 60 MG capsule Take 1 capsule (60 mg total) by mouth daily. 04/20/20  Yes Luiz Ochoa, NP  gabapentin (NEURONTIN) 800 MG tablet Take 1 tablet (800 mg total) by mouth 3 (three) times daily. 04/20/20  Yes Luiz Ochoa, NP  gemfibrozil (LOPID) 600 MG tablet Take 1 tablet (600 mg total) by mouth 2 (two) times daily before a meal. 04/20/20  Yes Luiz Ochoa, NP  glucose blood test strip Use as instructed to test blood sugar twice daily 09/05/19  Yes [place], Audie Clear, NP  glyBURIDE (DIABETA)  5 MG tablet Take 1 tablet (5 mg total) by mouth daily with supper. 04/20/20  Yes Luiz Ochoa, NP  Lancets 28G MISC by Does not apply route daily.   Yes [provider]  lisinopril (ZESTRIL) 20 MG tablet Take 1 tablet (20 mg total) by mouth daily. Take one tab po qd 04/20/20  Yes Luiz Ochoa, NP  metoprolol tartrate (LOPRESSOR) 50 MG tablet Take 1 tablet (50 mg total) by mouth 2  (two) times daily. 04/20/20  Yes Luiz Ochoa, NP  omeprazole (PRILOSEC) 20 MG capsule Take 1 capsule (20 mg total) by mouth daily. 04/20/20  Yes Luiz Ochoa, NP  tadalafil (CIALIS) 20 MG tablet Take 0.5-1 tablets (10-20 mg total) by mouth every other day as needed for erectile dysfunction. 04/20/20  Yes Luiz Ochoa, NP  tamsulosin (FLOMAX) 0.4 MG CAPS capsule Take 1 capsule (0.4 mg total) by mouth daily. 05/05/20  Yes Stoioff, Ronda Fairly, MD  terbinafine (LAMISIL) 250 MG tablet Take 1 tablet (250 mg total) by mouth daily. 04/20/20  Yes Luiz Ochoa, NP    Allergies Pollinex-t [modified tree tyrosine adsorbate]  Family History  Problem Relation Age of Onset  . Hypertension Mother     Social History Social History   Tobacco Use  . Smoking status: Never Smoker  . Smokeless tobacco: Never Used  Substance Use Topics  . Alcohol use: Yes    Alcohol/week: 1.0 standard drink    Types: 1 Cans of beer per week    Comment: occ  . Drug use: No    Review of Systems  Constitutional: No fever/chills. Eyes: No visual changes. ENT: No sore throat. Cardiovascular: Denies chest pain. Respiratory: Denies shortness of breath. Gastrointestinal: No vomiting or diarrhea.  Genitourinary: Negative for dysuria.  Musculoskeletal: Negative for back pain. Skin: Negative for rash. Neurological: Negative for headaches, focal weakness or numbness.   ____________________________________________   PHYSICAL EXAM:  VITAL SIGNS: ED Triage Vitals  Enc Vitals Group     BP 06/12/20 1849 (!) 115/51     Pulse Rate 06/12/20 1849 89     Resp 06/12/20 1849 20     Temp 06/12/20 1849 97.8 F (36.6 C)     Temp Source 06/12/20 1849 Oral     SpO2 06/12/20 1849 97 %     Weight 06/12/20 1850 180 lb (81.6 kg)     Height 06/12/20 1850 5' 9"  (1.753 m)     Head Circumference --      Peak Flow --      Pain Score 06/12/20 1847 0     Pain Loc --      Pain Edu? --      Excl. in [place]? --      Constitutional: Alert and oriented. Well appearing and in no acute distress. Eyes: Conjunctivae are normal.  EOMI.  PERRLA. Head: Atraumatic. Nose: No congestion/rhinnorhea. Mouth/Throat: Mucous membranes are moist.   Neck: Normal range of motion.  Cardiovascular: Normal rate, regular rhythm. Grossly normal heart sounds.  Good peripheral circulation. Respiratory: Normal respiratory effort.  No retractions. Lungs CTAB. Gastrointestinal: No distention.  Musculoskeletal: No lower extremity edema.  Extremities warm and well perfused.  Neurologic:  Normal speech and language. No gross focal neurologic deficits are appreciated.  Skin:  Skin is warm and dry. No rash noted. Psychiatric: Mood and affect are normal. Speech and behavior are normal.  ____________________________________________   LABS (all labs ordered are listed, but only abnormal results are displayed)  Labs  Reviewed  BASIC METABOLIC PANEL - Abnormal; Notable for the following components:      Result Value   Glucose, Bld 227 (*)    BUN 29 (*)    Creatinine, Ser 2.22 (*)    GFR, Estimated 31 (*)    All other components within normal limits  CBC WITH DIFFERENTIAL/PLATELET - Abnormal; Notable for the following components:   Hemoglobin 12.8 (*)    HCT 38.2 (*)    All other components within normal limits  RESP PANEL BY RT-PCR (FLU A&B, COVID) ARPGX2  BASIC METABOLIC PANEL  CBC  TROPONIN I (HIGH SENSITIVITY)  TROPONIN I (HIGH SENSITIVITY)   ____________________________________________  EKG  ED ECG REPORT I, Arta Silence, the attending physician, personally viewed and interpreted this ECG.  Date: 06/12/2020 EKG Time: 1058 Rate: 82 Rhythm: normal sinus rhythm QRS Axis: normal Intervals: RBBB ST/T Wave abnormalities: normal Narrative Interpretation: no evidence of acute ischemia; no significant change when compared to EKG of  01/21/2020  ____________________________________________  RADIOLOGY    ____________________________________________   PROCEDURES  Procedure(s) performed: No  Procedures  Critical Care performed: No ____________________________________________   INITIAL IMPRESSION / ASSESSMENT AND PLAN / ED COURSE  Pertinent labs & imaging results that were available during my care of the patient were reviewed by me and considered in my medical decision making (see chart for details).  70 year old male with PMH as noted above presents with lightheadedness occurring after he had been working outside all day and then came inside and showered.  He felt fine during his exertion.  The lightheadedness was associated with hypotension.  The symptoms have now resolved.  On exam, the patient is well-appearing.  His vital signs are normal.  He has a normal blood pressure currently.  Physical exam is otherwise unremarkable.  Neurologic exam is nonfocal.  EKG shows no acute abnormality.  Overall presentation is consistent with dehydration or vasovagal near syncope.  It has been hotter in the last few days than recently, although the patient states he felt well during his actual exertion.  Differential includes electrolyte abnormality, other metabolic disturbance, or less likely cardiac cause.  There is no evidence of infection/sepsis.  We will obtain basic labs, troponin, give a fluid bolus, and reassess.  ----------------------------------------- 9:19 PM on 06/12/2020 -----------------------------------------  Lab work-up reveals AKI, with creatinine over 2 which is up from 1.04 a few months ago.  Given this finding, we will admit the patient for further work-up and management.  I consulted Dr. Flossie Buffy from the hospitalist service for admission. ____________________________________________   FINAL CLINICAL IMPRESSION(S) / ED DIAGNOSES  Final diagnoses:  AKI (acute kidney injury) ([place])  Near syncope       NEW MEDICATIONS STARTED DURING THIS VISIT:  Current Discharge Medication List       Note:  This document was prepared using Dragon voice recognition software and may include unintentional dictation errors.   Arta Silence, MD 06/13/20 [phone]

## 2020-06-13 ENCOUNTER — Encounter: Payer: Self-pay | Admitting: Family Medicine

## 2020-06-13 DIAGNOSIS — K219 Gastro-esophageal reflux disease without esophagitis: Secondary | ICD-10-CM | POA: Diagnosis not present

## 2020-06-13 DIAGNOSIS — N179 Acute kidney failure, unspecified: Secondary | ICD-10-CM

## 2020-06-13 DIAGNOSIS — R55 Syncope and collapse: Secondary | ICD-10-CM | POA: Diagnosis not present

## 2020-06-13 LAB — BASIC METABOLIC PANEL
Anion gap: 10 (ref 5–15)
BUN: 29 mg/dL — ABNORMAL HIGH (ref 8–23)
CO2: 23 mmol/L (ref 22–32)
Calcium: 9 mg/dL (ref 8.9–10.3)
Chloride: 107 mmol/L (ref 98–111)
Creatinine, Ser: 1.5 mg/dL — ABNORMAL HIGH (ref 0.61–1.24)
GFR, Estimated: 50 mL/min — ABNORMAL LOW (ref >60)
Glucose, Bld: 126 mg/dL — ABNORMAL HIGH (ref 70–99)
Potassium: 4.7 mmol/L (ref 3.5–5.1)
Sodium: 140 mmol/L (ref 135–145)

## 2020-06-13 LAB — GLUCOSE, CAPILLARY: Glucose-Capillary: 142 mg/dL — ABNORMAL HIGH (ref 70–99)

## 2020-06-13 LAB — CBC
HCT: 36 % — ABNORMAL LOW (ref 39.0–52.0)
Hemoglobin: 12.1 g/dL — ABNORMAL LOW (ref 13.0–17.0)
MCH: 29.2 pg (ref 26.0–34.0)
MCHC: 33.6 g/dL (ref 30.0–36.0)
MCV: 87 fL (ref 80.0–100.0)
Platelets: 291 10*3/uL (ref 150–400)
RBC: 4.14 MIL/uL — ABNORMAL LOW (ref 4.22–5.81)
RDW: 13 % (ref 11.5–15.5)
WBC: 6.6 10*3/uL (ref 4.0–10.5)
nRBC: 0 % (ref 0.0–0.2)

## 2020-06-13 MED ORDER — ASPIRIN EC 81 MG PO TBEC
81.0000 mg | DELAYED_RELEASE_TABLET | Freq: Every day | ORAL | Status: DC
  Administered 2020-06-13: 81 mg via ORAL
  Filled 2020-06-13: qty 1

## 2020-06-13 MED ORDER — DULOXETINE HCL 60 MG PO CPEP
60.0000 mg | ORAL_CAPSULE | Freq: Every day | ORAL | Status: DC
  Administered 2020-06-13: 60 mg via ORAL
  Filled 2020-06-13: qty 1

## 2020-06-13 MED ORDER — GABAPENTIN 400 MG PO CAPS
800.0000 mg | ORAL_CAPSULE | Freq: Three times a day (TID) | ORAL | Status: DC
  Administered 2020-06-13: 800 mg via ORAL
  Filled 2020-06-13: qty 2

## 2020-06-13 MED ORDER — GEMFIBROZIL 600 MG PO TABS
600.0000 mg | ORAL_TABLET | Freq: Two times a day (BID) | ORAL | Status: DC
  Administered 2020-06-13: 600 mg via ORAL
  Filled 2020-06-13 (×2): qty 1

## 2020-06-13 MED ORDER — TERBINAFINE HCL 250 MG PO TABS
250.0000 mg | ORAL_TABLET | Freq: Every day | ORAL | Status: DC
  Administered 2020-06-13: 250 mg via ORAL
  Filled 2020-06-13: qty 1

## 2020-06-13 MED ORDER — PANTOPRAZOLE SODIUM 40 MG PO TBEC
40.0000 mg | DELAYED_RELEASE_TABLET | Freq: Every day | ORAL | Status: DC
  Administered 2020-06-13: 40 mg via ORAL
  Filled 2020-06-13: qty 1

## 2020-06-13 MED ORDER — INSULIN ASPART 100 UNIT/ML ~~LOC~~ SOLN
0.0000 [IU] | Freq: Three times a day (TID) | SUBCUTANEOUS | Status: DC
Start: 2020-06-13 — End: 2020-06-13

## 2020-06-13 MED ORDER — GABAPENTIN 800 MG PO TABS
800.0000 mg | ORAL_TABLET | Freq: Three times a day (TID) | ORAL | Status: DC
  Filled 2020-06-13 (×3): qty 1

## 2020-06-13 MED ORDER — METOPROLOL TARTRATE 50 MG PO TABS
50.0000 mg | ORAL_TABLET | Freq: Two times a day (BID) | ORAL | Status: DC
  Administered 2020-06-13: 50 mg via ORAL
  Filled 2020-06-13: qty 1

## 2020-06-13 MED ORDER — TAMSULOSIN HCL 0.4 MG PO CAPS
0.4000 mg | ORAL_CAPSULE | Freq: Every day | ORAL | Status: DC
  Administered 2020-06-13: 0.4 mg via ORAL
  Filled 2020-06-13: qty 1

## 2020-06-13 NOTE — Plan of Care (Signed)

## 2020-06-13 NOTE — Discharge Instructions (Signed)
Pt advised to hydrate when out in the heat esp doing yard work! Hold BP meds if SBP <125

## 2020-06-13 NOTE — Discharge Summary (Signed)
Shawn Greene NAME: Shawn Greene    MR#:  [id number]  DATE OF BIRTH:  10-21-1950  DATE OF ADMISSION:  06/12/2020 ADMITTING PHYSICIAN: Orene Desanctis, DO  DATE OF DISCHARGE: 06/13/2020  PRIMARY CARE PHYSICIAN: [name], MD    ADMISSION DIAGNOSIS:  Pre-syncope [R55] AKI (acute kidney injury) ([place]) [N17.9] Near syncope [R55]  DISCHARGE DIAGNOSIS:  Heat Exhaustion Acute Renal failure due to dehyration  SECONDARY DIAGNOSIS:   Past Medical History:  Diagnosis Date  . Chronic tension-type headache, not intractable   . Essential (primary) hypertension   . Mixed hyperlipidemia   . Psoriasis   . Type 2 diabetes, controlled, with neuropathy ([place])   . 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 male with medical history significant for hypertension, RBBB, type 2 diabetes, hyperlipidemia who presents with concerns of dizzinessPatient was out mowing the lawn today from around noon to 4:00PM and sweated significantly  Pre-syncope likely secondary to heat exhaustion/ hypovolemia/dehydration --Patient had symptoms after 4 hours in the heat mowing the lawn -Reports resolution of symptoms following IV fluids in the ED --NSR on tele -Troponin reassuring at 5 and 4 -feels better BP stable. No cp  AKI -Creatinine of 2.22 from a prior of 1.04 --creat 1.5 today --Prerenal azotemia from hypovolemia --overall feeling better  Hypertension --Patient reported hypotension at home but blood pressure has remained stable here --Continue metoprolol and resume Lisinopril at d/c with holding parameter  Type 2 diabetes with hyperglycemia Hemoglobin of A1c of 6.7 in March -continue home meds  GERD continue PPI  HLD continue gemfibrozil   Erectile dysfunction F/u PCP  DVT prophylaxis:.SCDs Code Status: Full Family Communication: Plan discussed with patient and wife at bedside  disposition  Plan: Home with observation Consults called:  Admission status: Observation  Level of care: Med-Surg  Status is: Observation  Overall improved D/c home. Pt is excited to go home. Wife at bedside  CONSULTS OBTAINED:    DRUG ALLERGIES:   Allergies  Allergen Reactions  . Pollinex-T [Modified Tree Tyrosine Adsorbate]     DISCHARGE MEDICATIONS:   Allergies as of 06/13/2020      Reactions   Pollinex-t [modified Tree Tyrosine Adsorbate]       Medication List    TAKE these medications   aspirin EC 81 MG tablet Take 81 mg by mouth daily. Swallow whole.   blood glucose meter kit and supplies Kit Dispense based on patient and insurance preference. Use twice daily as directed   DULoxetine 60 MG capsule Commonly known as: CYMBALTA Take 1 capsule (60 mg total) by mouth daily.   gabapentin 800 MG tablet Commonly known as: NEURONTIN Take 1 tablet (800 mg total) by mouth 3 (three) times daily.   gemfibrozil 600 MG tablet Commonly known as: LOPID Take 1 tablet (600 mg total) by mouth 2 (two) times daily before a meal.   glucose blood test strip Use as instructed to test blood sugar twice daily   glyBURIDE 5 MG tablet Commonly known as: DIABETA Take 1 tablet (5 mg total) by mouth daily with supper.   Lancets 28G Misc by Does not apply route daily.   lisinopril 20 MG tablet Commonly known as: ZESTRIL Take 1 tablet (20 mg total) by mouth daily. Take one tab po qd   metoprolol tartrate 50 MG tablet Commonly known as: LOPRESSOR Take 1 tablet (50 mg total) by  mouth 2 (two) times daily.   omeprazole 20 MG capsule Commonly known as: PRILOSEC Take 1 capsule (20 mg total) by mouth daily.   tadalafil 20 MG tablet Commonly known as: CIALIS Take 0.5-1 tablets (10-20 mg total) by mouth every other day as needed for erectile dysfunction.   tamsulosin 0.4 MG Caps capsule Commonly known as: FLOMAX Take 1 capsule (0.4 mg total) by mouth daily.   terbinafine 250 MG  tablet Commonly known as: LAMISIL Take 1 tablet (250 mg total) by mouth daily.       If you experience worsening of your admission symptoms, develop shortness of breath, life threatening emergency, suicidal or homicidal thoughts you must seek medical attention immediately by calling 911 or calling your MD immediately  if symptoms less severe.  You Must read complete instructions/literature along with all the possible adverse reactions/side effects for all the Medicines you take and that have been prescribed to you. Take any new Medicines after you have completely understood and accept all the possible adverse reactions/side effects.   Please note  You were cared for by a hospitalist during your hospital stay. If you have any questions about your discharge medications or the care you received while you were in the hospital after you are discharged, you can call the unit and asked to speak with the hospitalist on call if the hospitalist that took care of you is not available. Once you are discharged, your primary care physician will handle any further medical issues. Please note that NO REFILLS for any discharge medications will be authorized once you are discharged, as it is imperative that you return to your primary care physician (or establish a relationship with a primary care physician if you do not have one) for your aftercare needs so that they can reassess your need for medications and monitor your lab values. Today   SUBJECTIVE  Doing well Wants to go home Ate well vitals ok Wife at bedside  VITAL SIGNS:  Blood pressure 127/61, pulse 91, temperature 98.3 F (36.8 C), temperature source Oral, resp. rate 18, height _0  (1.753 m), weight 81.6 kg, SpO2 100 %.  I/O:    Intake/Output Summary (Last 24 hours) at 06/13/2020 1051 Last data filed at 06/13/2020 0500 Gross per 24 hour  Intake --  Output 700 ml  Net -700 ml    PHYSICAL EXAMINATION:  GENERAL:  70 y.o.-year-old patient  lying in the bed with no acute distress.  LUNGS: Normal breath sounds bilaterally, no wheezing, rales,rhonchi or crepitation. No use of accessory muscles of respiration.  CARDIOVASCULAR: S1, S2 normal. No murmurs, rubs, or gallops.  ABDOMEN: Soft, non-tender, non-distended. Bowel sounds present. No organomegaly or mass.  EXTREMITIES: No pedal edema, cyanosis, or clubbing.  NEUROLOGIC: Cranial nerves II through XII are intact. Muscle strength 5/5 in all extremities. Sensation intact. Gait not checked.  PSYCHIATRIC:  patient is alert and oriented x 3.  SKIN: No obvious rash, lesion, or ulcer.   DATA REVIEW:   CBC  Recent Labs  Lab 06/13/20 0544  WBC 6.6  HGB 12.1*  HCT 36.0*  PLT 291    Chemistries  Recent Labs  Lab 06/13/20 0544  NA 140  K 4.7  CL 107  CO2 23  GLUCOSE 126*  BUN 29*  CREATININE 1.50*  CALCIUM 9.0    Microbiology Results   Recent Results (from the past 240 hour(s))  Resp Panel by RT-PCR (Flu A&B, Covid) Nasopharyngeal Swab     Status: None  Collection Time: 06/12/20  9:03 PM   Specimen: Nasopharyngeal Swab; Nasopharyngeal(NP) swabs in vial transport medium  Result Value Ref Range Status   SARS Coronavirus 2 by RT PCR NEGATIVE NEGATIVE Final    Comment: (NOTE) SARS-CoV-2 target nucleic acids are NOT DETECTED.  The SARS-CoV-2 RNA is generally detectable in upper respiratory specimens during the acute phase of infection. The lowest concentration of SARS-CoV-2 viral copies this assay can detect is 138 copies/mL. A negative result does not preclude SARS-Cov-2 infection and should not be used as the sole basis for treatment or other patient management decisions. A negative result may occur with  improper specimen collection/handling, submission of specimen other than nasopharyngeal swab, presence of viral mutation(s) within the areas targeted by this assay, and inadequate number of viral copies(<138 copies/mL). A negative result must be combined  with clinical observations, patient history, and epidemiological information. The expected result is Negative.  Fact Sheet for Patients:  EntrepreneurPulse.com.au  Fact Sheet for Healthcare Providers:  IncredibleEmployment.be  This test is no t yet approved or cleared by the [place] FDA and  has been authorized for detection and/or diagnosis of SARS-CoV-2 by FDA under an Emergency Use Authorization (EUA). This EUA will remain  in effect (meaning this test can be used) for the duration of the COVID-19 declaration under Section 564(b)(1) of the Act, 21 U.S.C.section 360bbb-3(b)(1), unless the authorization is terminated  or revoked sooner.       Influenza A by PCR NEGATIVE NEGATIVE Final   Influenza B by PCR NEGATIVE NEGATIVE Final    Comment: (NOTE) The Xpert Xpress SARS-CoV-2/FLU/RSV plus assay is intended as an aid in the diagnosis of influenza from Nasopharyngeal swab specimens and should not be used as a sole basis for treatment. Nasal washings and aspirates are unacceptable for Xpert Xpress SARS-CoV-2/FLU/RSV testing.  Fact Sheet for Patients: EntrepreneurPulse.com.au  Fact Sheet for Healthcare Providers: IncredibleEmployment.be  This test is not yet approved or cleared by the [place] FDA and has been authorized for detection and/or diagnosis of SARS-CoV-2 by FDA under an Emergency Use Authorization (EUA). This EUA will remain in effect (meaning this test can be used) for the duration of the COVID-19 declaration under Section 564(b)(1) of the Act, 21 U.S.C. section 360bbb-3(b)(1), unless the authorization is terminated or revoked.  Performed at [facility], [address] [place], [place] [address]     RADIOLOGY:  No results found.   CODE STATUS:     Code Status Orders  (From admission, onward)         Start     Ordered   06/12/20 2153  Full code  Continuous         06/12/20 2153        Code Status History    Date Active Date Inactive Code Status Order ID Comments User Context   10/29/2018 0836 10/30/2018 1859 Full Code [id number]  Harrie Foreman, MD ED   Advance Care Planning Activity       TOTAL TIME TAKING CARE OF THIS PATIENT: *40* minutes.    Fritzi Mandes M.D  [facility]    CC: Primary care physician; [name], MD

## 2020-06-13 NOTE — Plan of Care (Signed)
  Problem: Education: Goal: Knowledge of General Education information will improve Description: Including pain rating scale, medication(s)/side effects and non-pharmacologic comfort measures 06/13/2020 0023 by Karie Fetch, RN Outcome: Progressing 06/13/2020 0023 by Karie Fetch, RN Outcome: Progressing   Problem: Clinical Measurements: Goal: Diagnostic test results will improve 06/13/2020 0023 by Karie Fetch, RN Outcome: Progressing 06/13/2020 0023 by Karie Fetch, RN Outcome: Progressing   Problem: Activity: Goal: Risk for activity intolerance will decrease 06/13/2020 0023 by Karie Fetch, RN Outcome: Progressing 06/13/2020 0023 by Karie Fetch, RN Outcome: Progressing   Problem: Coping: Goal: Level of anxiety will decrease 06/13/2020 0023 by Karie Fetch, RN Outcome: Progressing 06/13/2020 0023 by Karie Fetch, RN Outcome: Progressing   Problem: Safety: Goal: Ability to remain free from injury will improve 06/13/2020 0023 by Karie Fetch, RN Outcome: Progressing 06/13/2020 0023 by Karie Fetch, RN Outcome: Progressing   Problem: Skin Integrity: Goal: Risk for impaired skin integrity will decrease 06/13/2020 0023 by Karie Fetch, RN Outcome: Progressing 06/13/2020 0023 by Karie Fetch, RN Outcome: Progressing

## 2020-06-14 ENCOUNTER — Telehealth: Payer: Self-pay | Admitting: Pharmacist

## 2020-06-14 NOTE — Progress Notes (Addendum)
Chronic Care Management Pharmacy Assistant   Name: Shawn Greene  MRN: [id number] DOB: 1950-04-29  Reason for Encounter: Disease State For DM   Conditions to be addressed/monitored: HTN, GERD, Type II DM, BPH, Osteoarthritis, HLD  Recent office visits:  None since 05/19/20  Recent consult visits:  None since 05/19/20  Hospital visits:  06/12/20 [facility] 06/12/20-06/13/20 (17 hours) For Pre-syncope. No medication changes.   Medications: Outpatient Encounter Medications as of 06/14/2020  Medication Sig   aspirin EC 81 MG tablet Take 81 mg by mouth daily. Swallow whole.   blood glucose meter kit and supplies KIT Dispense based on patient and insurance preference. Use twice daily as directed   DULoxetine (CYMBALTA) 60 MG capsule Take 1 capsule (60 mg total) by mouth daily.   gabapentin (NEURONTIN) 800 MG tablet Take 1 tablet (800 mg total) by mouth 3 (three) times daily.   gemfibrozil (LOPID) 600 MG tablet Take 1 tablet (600 mg total) by mouth 2 (two) times daily before a meal.   glucose blood test strip Use as instructed to test blood sugar twice daily   glyBURIDE (DIABETA) 5 MG tablet Take 1 tablet (5 mg total) by mouth daily with supper.   Lancets 28G MISC by Does not apply route daily.   lisinopril (ZESTRIL) 20 MG tablet Take 1 tablet (20 mg total) by mouth daily. Take one tab po qd   metoprolol tartrate (LOPRESSOR) 50 MG tablet Take 1 tablet (50 mg total) by mouth 2 (two) times daily.   omeprazole (PRILOSEC) 20 MG capsule Take 1 capsule (20 mg total) by mouth daily.   tadalafil (CIALIS) 20 MG tablet Take 0.5-1 tablets (10-20 mg total) by mouth every other day as needed for erectile dysfunction.   tamsulosin (FLOMAX) 0.4 MG CAPS capsule Take 1 capsule (0.4 mg total) by mouth daily.   terbinafine (LAMISIL) 250 MG tablet Take 1 tablet (250 mg total) by mouth daily.   No facility-administered encounter medications on file as of 06/14/2020.    Recent Relevant  Labs: Lab Results  Component Value Date/Time   HGBA1C 6.7 (A) 04/20/2020 09:08 AM   HGBA1C 6.6 (H) 01/22/2020 09:04 AM   HGBA1C 6.3 (A) 12/02/2019 08:52 AM    Kidney Function Lab Results  Component Value Date/Time   CREATININE 1.50 (H) 06/13/2020 05:44 AM   CREATININE 2.22 (H) 06/12/2020 07:28 PM   GFRNONAA 50 (L) 06/13/2020 05:44 AM   GFRAA 84 01/22/2020 09:04 AM    Current antihyperglycemic regimen:  Glyburide 33m daily with supper  What recent interventions/DTPs have been made to improve glycemic control:  None  Have there been any recent hospitalizations or ED visits since last visit with C[facility]?  [facility]. No medication changes.   Patient denies hypoglycemic symptoms, including None   Patient reports hyperglycemic symptoms, including polyuria   How often are you checking your blood sugar? twice daily   What are your blood sugars ranging?  Patient stated his blood sugars are between 130-180.  During the week, how often does your blood glucose drop below 70?  Patient stated Never.  Are you checking your feet daily/regularly?  Patient stated he checks his feet daily.   Adherence Review: Is the patient currently on a STATIN medication? None  Is the patient currently on ACE/ARB medication? Lisinopril 20 mg  Does the patient have >5 day gap between last estimated fill dates? Per misc rpts, no.  Star Rating  Drugs:Glyburide 5 mg 90 DS 05/28/20 , Lisinopril 20 mg 30 DS 06/11/20  Follow-Up:Pharmacist Review  Charlann Lange, RMA Clinical Pharmacist Assistant [phone]  7 minutes spent in review, coordination, and documentation.  Reviewed by: Beverly Milch, PharmD Clinical Pharmacist [facility] [phone]

## 2020-06-17 ENCOUNTER — Ambulatory Visit (INDEPENDENT_AMBULATORY_CARE_PROVIDER_SITE_OTHER): Payer: PPO | Admitting: Physician Assistant

## 2020-06-17 ENCOUNTER — Other Ambulatory Visit: Payer: Self-pay

## 2020-06-17 DIAGNOSIS — Z09 Encounter for follow-up examination after completed treatment for conditions other than malignant neoplasm: Secondary | ICD-10-CM

## 2020-06-17 DIAGNOSIS — E860 Dehydration: Secondary | ICD-10-CM

## 2020-06-17 DIAGNOSIS — N179 Acute kidney failure, unspecified: Secondary | ICD-10-CM | POA: Diagnosis not present

## 2020-06-17 NOTE — Progress Notes (Signed)
[facility] [address], [place] [address]  Internal MEDICINE  Office Visit Note  Patient Name: Shawn Greene  [id number]  [id number]  Date of Service: 06/20/2020     Chief Complaint  Patient presents with  . Diabetes  . Hospitalization Follow-up     HPI Pt is here for recent hospital follow up. -Saturday was outside all day with little water in the heat and got dehydrated. Got lightheaded and dizzy and took BP at home which was 80/40. Went to ED and was given IV fluids and improved to d/c from observation unit on Sunday. He was diagnosed with an AKI secondary to hypovolemia.  Labs were improving prior to discharge, however we were still abnormal-will require repeat testing in the next week or 2. Patient reports no medication changes. -Patient has no complaints today.  He is feeling much better and has been hydrating.  He states he has learned his lesson and knows to take breaks from the heat and drink ample water.  Current Medication: Outpatient Encounter Medications as of 06/17/2020  Medication Sig  . aspirin EC 81 MG tablet Take 81 mg by mouth daily. Swallow whole.  . blood glucose meter kit and supplies KIT Dispense based on patient and insurance preference. Use twice daily as directed  . DULoxetine (CYMBALTA) 60 MG capsule Take 1 capsule (60 mg total) by mouth daily.  Marland Kitchen gabapentin (NEURONTIN) 800 MG tablet Take 1 tablet (800 mg total) by mouth 3 (three) times daily.  Marland Kitchen gemfibrozil (LOPID) 600 MG tablet Take 1 tablet (600 mg total) by mouth 2 (two) times daily before a meal.  . glucose blood test strip Use as instructed to test blood sugar twice daily  . glyBURIDE (DIABETA) 5 MG tablet Take 1 tablet (5 mg total) by mouth daily with supper.  . Lancets 28G MISC by Does not apply route daily.  Marland Kitchen lisinopril (ZESTRIL) 20 MG tablet Take 1 tablet (20 mg total) by mouth daily. Take one tab po qd  . metoprolol tartrate (LOPRESSOR) 50 MG tablet Take 1 tablet (50 mg  total) by mouth 2 (two) times daily.  Marland Kitchen omeprazole (PRILOSEC) 20 MG capsule Take 1 capsule (20 mg total) by mouth daily.  . tadalafil (CIALIS) 20 MG tablet Take 0.5-1 tablets (10-20 mg total) by mouth every other day as needed for erectile dysfunction.  . tamsulosin (FLOMAX) 0.4 MG CAPS capsule Take 1 capsule (0.4 mg total) by mouth daily.  Marland Kitchen terbinafine (LAMISIL) 250 MG tablet Take 1 tablet (250 mg total) by mouth daily.   No facility-administered encounter medications on file as of 06/17/2020.    Surgical History: Past Surgical History:  Procedure Laterality Date  . ANKLE SURGERY    . COLECTOMY  2012    Medical History: Past Medical History:  Diagnosis Date  . Chronic tension-type headache, not intractable   . Essential (primary) hypertension   . Mixed hyperlipidemia   . Psoriasis   . Type 2 diabetes, controlled, with neuropathy ([place])   . 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 Hypertension Mother     Social History   Socioeconomic History  . Marital status: Married    Spouse name: Not on file  . Number of children: Not on file  . Years of education: Not on file  . Highest education level: Not on file  Occupational History  . Not on file  Tobacco Use  .  Smoking status: Never Smoker  . Smokeless tobacco: Never Used  Substance and Sexual Activity  . Alcohol use: Yes    Alcohol/week: 1.0 standard drink    Types: 1 Cans of beer per week    Comment: occ  . Drug use: No  . Sexual activity: Not on file  Other Topics Concern  . Not on file  Social History Narrative  . Not on file   Social Determinants of Health   Financial Resource Strain: Low Risk   . Difficulty of Paying Living Expenses: Not hard at all  Food Insecurity: Not on file  Transportation Needs: Not on file  Physical Activity: Not on file  Stress: Not on file  Social Connections: Not on file  Intimate Partner Violence: Not on  file      Review of Systems  Constitutional: Negative for chills, fatigue and unexpected weight change.  HENT: Negative for congestion, postnasal drip, rhinorrhea, sneezing and sore throat.   Eyes: Negative for redness.  Respiratory: Negative for cough, chest tightness and shortness of breath.   Cardiovascular: Negative for chest pain and palpitations.  Gastrointestinal: Negative for abdominal pain, constipation, diarrhea, nausea and vomiting.  Genitourinary: Negative for dysuria and frequency.  Musculoskeletal: Negative for arthralgias, back pain, joint swelling and neck pain.  Skin: Negative for rash.  Neurological: Negative.  Negative for tremors and numbness.  Hematological: Negative for adenopathy. Does not bruise/bleed easily.  Psychiatric/Behavioral: Negative for behavioral problems (Depression), sleep disturbance and suicidal ideas. The patient is not nervous/anxious.     Vital Signs: BP 128/80   Pulse 86   Temp 98.3 F (36.8 C)   Resp 16   Ht 5' 9"  (1.753 m)   Wt 177 lb 12.8 oz (80.6 kg)   SpO2 93%   BMI 26.26 kg/m    Physical Exam Vitals and nursing note reviewed.  Constitutional:      General: He is not in acute distress.    Appearance: He is well-developed. He is not diaphoretic.  HENT:     Head: Normocephalic and atraumatic.     Mouth/Throat:     Pharynx: No oropharyngeal exudate.  Eyes:     Pupils: Pupils are equal, round, and reactive to light.  Neck:     Thyroid: No thyromegaly.     Vascular: No JVD.     Trachea: No tracheal deviation.  Cardiovascular:     Rate and Rhythm: Normal rate and regular rhythm.     Heart sounds: Normal heart sounds. No murmur heard. No friction rub. No gallop.   Pulmonary:     Effort: Pulmonary effort is normal. No respiratory distress.     Breath sounds: No wheezing or rales.  Chest:     Chest wall: No tenderness.  Abdominal:     General: Bowel sounds are normal.     Palpations: Abdomen is soft.  Musculoskeletal:         General: Normal range of motion.     Cervical back: Normal range of motion and neck supple.  Lymphadenopathy:     Cervical: No cervical adenopathy.  Skin:    General: Skin is warm and dry.  Neurological:     Mental Status: He is alert and oriented to person, place, and time.     Cranial Nerves: No cranial nerve deficit.  Psychiatric:        Behavior: Behavior normal.        Thought Content: Thought content normal.        Judgment: Judgment  normal.       Assessment/Plan: 1. Encounter for examination following treatment at hospital Patient recently treated with IV fluids in the hospital observation unit.  He is much improved and has no complaints on visit today.  No changes to medications were made.  2. Dehydration after exertion Patient received IV fluids during observation unit stay, he is hydrating well at home now and BP is stable in office.  3. AKI (acute kidney injury) ([place]) Labs improving before discharge however still abnormal.  We will repeat a BMP in 1 to 2 weeks to ensure continued improvement. - Basic metabolic panel   General Counseling: ritik stavola understanding of the findings of todays visit and agrees with plan of treatment. I have discussed any further diagnostic evaluation that may be needed or ordered today. We also reviewed his medications today. he has been encouraged to call the office with any questions or concerns that should arise related to todays visit.    Counseling:    Orders Placed This Encounter  Procedures  . Basic metabolic panel    This patient was seen by Drema Dallas, PA-C in collaboration with Dr. Clayborn Bigness as a part of collaborative care agreement.   I have reviewed all medical records from hospital follow up including radiology reports and consults from other physicians. Appropriate follow up diagnostics will be scheduled as needed. Patient/ Family understands the plan of treatment. Time spent 30 minutes.   Dr  [name], MD Internal Medicine

## 2020-06-19 DIAGNOSIS — I10 Essential (primary) hypertension: Secondary | ICD-10-CM

## 2020-06-19 DIAGNOSIS — E1140 Type 2 diabetes mellitus with diabetic neuropathy, unspecified: Secondary | ICD-10-CM

## 2020-07-12 ENCOUNTER — Telehealth: Payer: Self-pay

## 2020-07-12 ENCOUNTER — Other Ambulatory Visit: Payer: Self-pay | Admitting: Urology

## 2020-07-12 NOTE — Telephone Encounter (Signed)
Please schedule follow-up appointment September 2022

## 2020-07-12 NOTE — Telephone Encounter (Signed)
Notified patient as instructed, patient pleased. Discussed follow-up appointments, patient agrees  

## 2020-07-12 NOTE — Telephone Encounter (Signed)
Pharmacy called wanting to know if a 90 day script could be sent in on patient's Tamsulosin. Verbal ok given for 90 day script with 1 refill. 30 day Trial given at last office visit Would you like me to contact patient to schedule a follow up?

## 2020-07-14 DIAGNOSIS — N179 Acute kidney failure, unspecified: Secondary | ICD-10-CM | POA: Diagnosis not present

## 2020-07-15 LAB — BASIC METABOLIC PANEL
BUN/Creatinine Ratio: 22 (ref 10–24)
BUN: 22 mg/dL (ref 8–27)
CO2: 22 mmol/L (ref 20–29)
Calcium: 9.7 mg/dL (ref 8.6–10.2)
Chloride: 99 mmol/L (ref 96–106)
Creatinine, Ser: 1.01 mg/dL (ref 0.76–1.27)
Glucose: 195 mg/dL — ABNORMAL HIGH (ref 65–99)
Potassium: 5 mmol/L (ref 3.5–5.2)
Sodium: 138 mmol/L (ref 134–144)
eGFR: 80 mL/min/{1.73_m2} (ref >59)

## 2020-07-21 ENCOUNTER — Ambulatory Visit (INDEPENDENT_AMBULATORY_CARE_PROVIDER_SITE_OTHER): Payer: PPO | Admitting: Nurse Practitioner

## 2020-07-21 ENCOUNTER — Other Ambulatory Visit: Payer: Self-pay

## 2020-07-21 ENCOUNTER — Encounter: Payer: Self-pay | Admitting: Nurse Practitioner

## 2020-07-21 VITALS — BP 138/84 | HR 94 | Temp 98.3°F | Resp 16 | Ht 69.0 in | Wt 178.2 lb

## 2020-07-21 DIAGNOSIS — E1140 Type 2 diabetes mellitus with diabetic neuropathy, unspecified: Secondary | ICD-10-CM | POA: Diagnosis not present

## 2020-07-21 DIAGNOSIS — E782 Mixed hyperlipidemia: Secondary | ICD-10-CM | POA: Diagnosis not present

## 2020-07-21 DIAGNOSIS — I10 Essential (primary) hypertension: Secondary | ICD-10-CM | POA: Diagnosis not present

## 2020-07-21 LAB — POCT GLYCOSYLATED HEMOGLOBIN (HGB A1C): Hemoglobin A1C: 7.5 % — AB (ref 4.0–5.6)

## 2020-07-21 MED ORDER — METFORMIN HCL 500 MG PO TABS: 500.0000 mg | ORAL_TABLET | Freq: Two times a day (BID) | ORAL | 2 refills | Status: DC

## 2020-07-21 MED ORDER — GLUCOSE BLOOD VI STRP: ORAL_STRIP | 3 refills | Status: DC

## 2020-07-21 NOTE — Progress Notes (Signed)
[facility] [place], [place] [address]  Internal MEDICINE  Office Visit Note  Patient Name: Shawn Greene  [id number]  [id number]  Date of Service: 07/21/2020  Chief Complaint  Patient presents with  . Follow-up    A1c    HPI Gorman presents for a follow-up visit for type 2 diabetes and A1C check. He also has a history of hyperlipidemia and hypertension.  -average glucose levels gave been 160 to more than 200 since last visit per patient. A1C today was 7.5 (elevated from 6.7 in February 2022). He has not been doing any diet modifications.  -last lipid profile from December 2021: HDL 30, total chol 165, triglyceride 242, VLDL 41, LDL 94. Currently taking lopid. Need to recheck lipid panel.  Blood pressure 138/84, well controlled with metoprolol and lisinopril.    Current Medication: Outpatient Encounter Medications as of 07/21/2020  Medication Sig  . aspirin EC 81 MG tablet Take 81 mg by mouth daily. Swallow whole.  . blood glucose meter kit and supplies KIT Dispense based on patient and insurance preference. Use twice daily as directed  . DULoxetine (CYMBALTA) 60 MG capsule Take 1 capsule (60 mg total) by mouth daily.  Marland Kitchen gabapentin (NEURONTIN) 800 MG tablet Take 1 tablet (800 mg total) by mouth 3 (three) times daily.  Marland Kitchen gemfibrozil (LOPID) 600 MG tablet Take 1 tablet (600 mg total) by mouth 2 (two) times daily before a meal.  . glyBURIDE (DIABETA) 5 MG tablet Take 1 tablet (5 mg total) by mouth daily with supper.  . Lancets 28G MISC by Does not apply route daily.  Marland Kitchen lisinopril (ZESTRIL) 20 MG tablet Take 1 tablet (20 mg total) by mouth daily. Take one tab po qd  . metFORMIN (GLUCOPHAGE) 500 MG tablet Take 1 tablet (500 mg total) by mouth 2 (two) times daily with a meal.  . metoprolol tartrate (LOPRESSOR) 50 MG tablet Take 1 tablet (50 mg total) by mouth 2 (two) times daily.  Marland Kitchen omeprazole (PRILOSEC) 20 MG capsule Take 1 capsule (20 mg total) by mouth daily.  .  tadalafil (CIALIS) 20 MG tablet Take 0.5-1 tablets (10-20 mg total) by mouth every other day as needed for erectile dysfunction.  . tamsulosin (FLOMAX) 0.4 MG CAPS capsule Take 1 capsule (0.4 mg total) by mouth daily.  Marland Kitchen terbinafine (LAMISIL) 250 MG tablet Take 1 tablet (250 mg total) by mouth daily.  . [DISCONTINUED] glucose blood test strip Use as instructed to test blood sugar twice daily  . glucose blood test strip Use as instructed to test blood sugar twice daily   No facility-administered encounter medications on file as of 07/21/2020.    Surgical History: Past Surgical History:  Procedure Laterality Date  . ANKLE SURGERY    . COLECTOMY  2012    Medical History: Past Medical History:  Diagnosis Date  . Chronic tension-type headache, not intractable   . Essential (primary) hypertension   . Mixed hyperlipidemia   . Psoriasis   . Type 2 diabetes, controlled, with neuropathy ([place])   . 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 Hypertension Mother     Social History   Socioeconomic History  . Marital status: Married    Spouse name: Not on file  . Number of children: Not on file  . Years of education: Not on file  . Highest education level: Not on file  Occupational History  .  Not on file  Tobacco Use  . Smoking status: Never Smoker  . Smokeless tobacco: Never Used  Substance and Sexual Activity  . Alcohol use: Yes    Alcohol/week: 1.0 standard drink    Types: 1 Cans of beer per week    Comment: occ  . Drug use: No  . Sexual activity: Not on file  Other Topics Concern  . Not on file  Social History Narrative  . Not on file   Social Determinants of Health   Financial Resource Strain: Low Risk   . Difficulty of Paying Living Expenses: Not hard at all  Food Insecurity: Not on file  Transportation Needs: Not on file  Physical Activity: Not on file  Stress: Not on file  Social Connections:  Not on file  Intimate Partner Violence: Not on file      Review of Systems  Constitutional: Negative for activity change, appetite change, chills, fatigue, fever and unexpected weight change.  HENT: Negative.  Negative for congestion, ear pain, rhinorrhea, sore throat and trouble swallowing.   Eyes: Negative.   Respiratory: Negative.  Negative for cough, chest tightness, shortness of breath and wheezing.   Cardiovascular: Negative.  Negative for chest pain.  Gastrointestinal: Negative.  Negative for abdominal pain, blood in stool, constipation, diarrhea, nausea and vomiting.  Endocrine: Negative.   Genitourinary: Negative.  Negative for difficulty urinating, dysuria, frequency, hematuria and urgency.  Musculoskeletal: Negative.  Negative for arthralgias, back pain, joint swelling, myalgias and neck pain.  Skin: Negative.  Negative for rash and wound.  Allergic/Immunologic: Negative.  Negative for immunocompromised state.  Neurological: Negative.  Negative for dizziness, seizures, numbness and headaches.  Hematological: Negative.   Psychiatric/Behavioral: Negative.  Negative for behavioral problems, self-injury and suicidal ideas. The patient is not nervous/anxious.     Vital Signs: BP 138/84   Pulse 94   Temp 98.3 F (36.8 C)   Resp 16   Ht 5' 9"  (1.753 m)   Wt 178 lb 3.2 oz (80.8 kg)   SpO2 97%   BMI 26.32 kg/m    Physical Exam Vitals reviewed.  Constitutional:      General: He is not in acute distress.    Appearance: Normal appearance. He is not ill-appearing.  HENT:     Head: Normocephalic and atraumatic.  Cardiovascular:     Rate and Rhythm: Normal rate and regular rhythm.     Pulses: Normal pulses.     Heart sounds: Normal heart sounds.  Pulmonary:     Effort: Pulmonary effort is normal.     Breath sounds: Normal breath sounds.  Skin:    General: Skin is warm and dry.     Capillary Refill: Capillary refill takes less than 2 seconds.  Neurological:     Mental  Status: He is alert and oriented to person, place, and time.  Psychiatric:        Mood and Affect: Mood normal.        Behavior: Behavior normal.    Assessment/Plan: 1. Type 2 diabetes mellitus with diabetic neuropathy, without long-term current use of insulin (HCC) A1c is up from 6.7 in february to 7.5 today. Glucose levels at home range from 160 to more than 200. Patient was only taking glyburide for diabetes. Last CMP showed GFR 80 and creatinine for 1.01. metformin 500 mg twice daily added. Will follow up in office in 4 weeks. Patient instructed to record blood sugars at home and bring the list to his next office visit. Discussed decreasing  high carb foods such as breads, sweets and starches, moving around more, and increasing lean protein foods in diet.  - POCT glycosylated hemoglobin (Hb A1C) - metFORMIN (GLUCOPHAGE) 500 MG tablet; Take 1 tablet (500 mg total) by mouth 2 (two) times daily with a meal.  Dispense: 60 tablet; Refill: 2 - glucose blood test strip; Use as instructed to test blood sugar twice daily  Dispense: 100 each; Refill: 3  2. Mixed hyperlipidemia Last lipid panel was from December 2021, recheck lipid panel prior to next office visit in 4 weeks. Currently taking lopid.  -Lipid profile  3. Essential hypertension, benign Stable and controlled with current medications, metoprolol and lisinopril.    General Counseling: audel coakley understanding of the findings of todays visit and agrees with plan of treatment. I have discussed any further diagnostic evaluation that may be needed or ordered today. We also reviewed his medications today. he has been encouraged to call the office with any questions or concerns that should arise related to todays visit.    Orders Placed This Encounter  Procedures  . Lipid Profile  . POCT glycosylated hemoglobin (Hb A1C)    Meds ordered this encounter  Medications  . metFORMIN (GLUCOPHAGE) 500 MG tablet    Sig: Take 1 tablet (500  mg total) by mouth 2 (two) times daily with a meal.    Dispense:  60 tablet    Refill:  2  . glucose blood test strip    Sig: Use as instructed to test blood sugar twice daily    Dispense:  100 each    Refill:  3    One touch brand strips    Return in about 1 month (around 08/20/2020) for F/U diabetes, Yaqub Arney PCP.   Total time spent:30 Minutes Time spent includes review of chart, medications, test results, and follow up plan with the patient.   [place] Controlled Substance Database was reviewed by me.  This patient was seen by Jonetta Osgood, FNP-C in collaboration with Dr. Clayborn Bigness as a part of collaborative care agreement.   Shantelle Alles R. Valetta Fuller, MSN, FNP-C Internal medicine

## 2020-07-27 DIAGNOSIS — R2689 Other abnormalities of gait and mobility: Secondary | ICD-10-CM | POA: Diagnosis not present

## 2020-07-27 DIAGNOSIS — M5481 Occipital neuralgia: Secondary | ICD-10-CM | POA: Diagnosis not present

## 2020-07-27 DIAGNOSIS — G609 Hereditary and idiopathic neuropathy, unspecified: Secondary | ICD-10-CM | POA: Diagnosis not present

## 2020-08-18 DIAGNOSIS — E782 Mixed hyperlipidemia: Secondary | ICD-10-CM | POA: Diagnosis not present

## 2020-08-19 LAB — LIPID PANEL
Chol/HDL Ratio: 5.6 ratio — ABNORMAL HIGH (ref 0.0–5.0)
Cholesterol, Total: 173 mg/dL (ref 100–199)
HDL: 31 mg/dL — ABNORMAL LOW (ref >39)
LDL Chol Calc (NIH): 96 mg/dL (ref 0–99)
Triglycerides: 273 mg/dL — ABNORMAL HIGH (ref 0–149)
VLDL Cholesterol Cal: 46 mg/dL — ABNORMAL HIGH (ref 5–40)

## 2020-08-20 ENCOUNTER — Encounter: Payer: Self-pay | Admitting: Nurse Practitioner

## 2020-08-20 ENCOUNTER — Other Ambulatory Visit: Payer: Self-pay

## 2020-08-20 ENCOUNTER — Ambulatory Visit (INDEPENDENT_AMBULATORY_CARE_PROVIDER_SITE_OTHER): Payer: PPO | Admitting: Nurse Practitioner

## 2020-08-20 VITALS — BP 138/84 | HR 78 | Temp 98.2°F | Resp 16 | Ht 69.0 in | Wt 182.2 lb

## 2020-08-20 DIAGNOSIS — I10 Essential (primary) hypertension: Secondary | ICD-10-CM | POA: Diagnosis not present

## 2020-08-20 DIAGNOSIS — E1140 Type 2 diabetes mellitus with diabetic neuropathy, unspecified: Secondary | ICD-10-CM | POA: Diagnosis not present

## 2020-08-20 DIAGNOSIS — K219 Gastro-esophageal reflux disease without esophagitis: Secondary | ICD-10-CM | POA: Diagnosis not present

## 2020-08-20 DIAGNOSIS — E782 Mixed hyperlipidemia: Secondary | ICD-10-CM | POA: Diagnosis not present

## 2020-08-20 LAB — POCT GLYCOSYLATED HEMOGLOBIN (HGB A1C): Hemoglobin A1C: 6.9 % — AB (ref 4.0–5.6)

## 2020-08-20 MED ORDER — METFORMIN HCL 500 MG PO TABS: 500.0000 mg | ORAL_TABLET | Freq: Two times a day (BID) | ORAL | 1 refills | Status: DC

## 2020-08-20 MED ORDER — LISINOPRIL 20 MG PO TABS: 20.0000 mg | ORAL_TABLET | Freq: Every day | ORAL | 1 refills | Status: DC

## 2020-08-20 MED ORDER — DULOXETINE HCL 60 MG PO CPEP: 60.0000 mg | ORAL_CAPSULE | Freq: Every day | ORAL | 1 refills | Status: DC

## 2020-08-20 MED ORDER — GABAPENTIN 800 MG PO TABS: 800.0000 mg | ORAL_TABLET | Freq: Three times a day (TID) | ORAL | 1 refills | Status: DC

## 2020-08-20 MED ORDER — PRAVASTATIN SODIUM 20 MG PO TABS: 20.0000 mg | ORAL_TABLET | Freq: Every day | ORAL | 1 refills | Status: DC

## 2020-08-20 MED ORDER — METOPROLOL TARTRATE 50 MG PO TABS: 50.0000 mg | ORAL_TABLET | Freq: Two times a day (BID) | ORAL | 1 refills | Status: DC

## 2020-08-20 MED ORDER — GEMFIBROZIL 600 MG PO TABS: 600.0000 mg | ORAL_TABLET | Freq: Two times a day (BID) | ORAL | 1 refills | Status: DC

## 2020-08-20 MED ORDER — OMEPRAZOLE 20 MG PO CPDR: 20.0000 mg | DELAYED_RELEASE_CAPSULE | Freq: Every day | ORAL | 1 refills | Status: DC

## 2020-08-20 NOTE — Progress Notes (Signed)
[facility] [place], [place] [address]  Internal MEDICINE  Office Visit Note  Patient Name: Shawn Greene  [id number]  [id number]  Date of Service: 08/24/2020  Chief Complaint  Patient presents with   Follow-up   Diabetes    A1C    HPI Shawn Greene presents for a follow up visit for diabetes. He had his A1C checked in error today. It was last checked  1 month ago and it was 7.5. Today his A1C was 6.9 which was an improvement. He also had his lipid profile drawn this week. His total cholesterol and LDL are normal. His VLDL and triglycerides are elevated and his HDL was low.  He denies any pain, or additional questions or concerns.   Current Medication: Outpatient Encounter Medications as of 08/20/2020  Medication Sig   aspirin EC 81 MG tablet Take 81 mg by mouth daily. Swallow whole.   blood glucose meter kit and supplies KIT Dispense based on patient and insurance preference. Use twice daily as directed   glucose blood test strip Use as instructed to test blood sugar twice daily   glyBURIDE (DIABETA) 5 MG tablet Take 1 tablet (5 mg total) by mouth daily with supper.   Lancets 28G MISC by Does not apply route daily.   pravastatin (PRAVACHOL) 20 MG tablet Take 1 tablet (20 mg total) by mouth daily.   tamsulosin (FLOMAX) 0.4 MG CAPS capsule Take 1 capsule (0.4 mg total) by mouth daily.   terbinafine (LAMISIL) 250 MG tablet Take 1 tablet (250 mg total) by mouth daily.   [DISCONTINUED] DULoxetine (CYMBALTA) 60 MG capsule Take 1 capsule (60 mg total) by mouth daily.   [DISCONTINUED] gabapentin (NEURONTIN) 800 MG tablet Take 1 tablet (800 mg total) by mouth 3 (three) times daily.   [DISCONTINUED] gemfibrozil (LOPID) 600 MG tablet Take 1 tablet (600 mg total) by mouth 2 (two) times daily before a meal.   [DISCONTINUED] lisinopril (ZESTRIL) 20 MG tablet Take 1 tablet (20 mg total) by mouth daily. Take one tab po qd   [DISCONTINUED] metFORMIN (GLUCOPHAGE) 500 MG tablet Take 1  tablet (500 mg total) by mouth 2 (two) times daily with a meal.   [DISCONTINUED] metoprolol tartrate (LOPRESSOR) 50 MG tablet Take 1 tablet (50 mg total) by mouth 2 (two) times daily.   [DISCONTINUED] omeprazole (PRILOSEC) 20 MG capsule Take 1 capsule (20 mg total) by mouth daily.   DULoxetine (CYMBALTA) 60 MG capsule Take 1 capsule (60 mg total) by mouth daily.   gabapentin (NEURONTIN) 800 MG tablet Take 1 tablet (800 mg total) by mouth 3 (three) times daily.   gemfibrozil (LOPID) 600 MG tablet Take 1 tablet (600 mg total) by mouth 2 (two) times daily before a meal.   lisinopril (ZESTRIL) 20 MG tablet Take 1 tablet (20 mg total) by mouth daily. Take one tab po qd   metFORMIN (GLUCOPHAGE) 500 MG tablet Take 1 tablet (500 mg total) by mouth 2 (two) times daily with a meal.   metoprolol tartrate (LOPRESSOR) 50 MG tablet Take 1 tablet (50 mg total) by mouth 2 (two) times daily.   omeprazole (PRILOSEC) 20 MG capsule Take 1 capsule (20 mg total) by mouth daily.   [DISCONTINUED] tadalafil (CIALIS) 20 MG tablet Take 0.5-1 tablets (10-20 mg total) by mouth every other day as needed for erectile dysfunction. (Patient not taking: Reported on 08/20/2020)   No facility-administered encounter medications on file as of 08/20/2020.    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     Family History: Family History  Problem Relation Age of Onset   Hypertension Mother     Social History   Socioeconomic History   Marital status: Married    Spouse name: Not on file   Number of children: Not on file   Years of education: Not on file   Highest education level: Not  on file  Occupational History   Not on file  Tobacco Use   Smoking status: Never   Smokeless tobacco: Never  Substance and Sexual Activity   Alcohol use: Yes    Alcohol/week: 1.0 standard drink    Types: 1 Cans of beer per week    Comment: occ   Drug use: No   Sexual activity: Not on file  Other Topics Concern   Not on file  Social History Narrative   Not on file   Social Determinants of Health   Financial Resource Strain: Low Risk    Difficulty of Paying Living Expenses: Not hard at all  Food Insecurity: Not on file  Transportation Needs: Not on file  Physical Activity: Not on file  Stress: Not on file  Social Connections: Not on file  Intimate Partner Violence: Not on file      Review of Systems  Constitutional:  Negative for chills, fatigue and unexpected weight change.  HENT:  Negative for congestion, rhinorrhea, sneezing and sore throat.   Eyes:  Negative for redness.  Respiratory:  Negative for cough, chest tightness and shortness of breath.   Cardiovascular:  Negative for chest pain and palpitations.  Gastrointestinal:  Negative for abdominal pain, constipation, diarrhea, nausea and vomiting.  Genitourinary:  Negative for dysuria and frequency.  Musculoskeletal:  Negative for arthralgias, back pain, joint swelling and neck pain.  Skin:  Negative for rash.  Neurological: Negative.  Negative for tremors and numbness.  Hematological:  Negative for adenopathy. Does not bruise/bleed easily.  Psychiatric/Behavioral:  Negative for behavioral problems (Depression), sleep disturbance and suicidal ideas. The patient is not nervous/anxious.    Vital Signs: BP 138/84   Pulse 78   Temp 98.2 F (36.8 C)   Resp 16   Ht _0  (1.753 m)   Wt 182 lb 3.2 oz (82.6 kg)   SpO2 97%   BMI 26.91 kg/m    Physical Exam Vitals reviewed.  Constitutional:      General: He is not in acute distress.    Appearance: Normal appearance. He is not ill-appearing.  Cardiovascular:      Rate and Rhythm: Normal rate and regular rhythm.     Pulses: Normal pulses.     Heart sounds: Normal heart sounds.  Pulmonary:     Effort: Pulmonary effort is normal.     Breath sounds: Normal breath sounds.  Skin:    General: Skin is warm and dry.     Capillary Refill: Capillary refill takes less than 2 seconds.  Neurological:     Mental Status: He is alert and oriented to person, place, and time.  Psychiatric:        Mood and Affect: Mood normal.  Behavior: Behavior normal.     Assessment/Plan: 1. Type 2 diabetes mellitus with diabetic neuropathy, without long-term current use of insulin (HCC) Although the A1C was checked in error today, it is improving. Refills of medications sent to pharmacy, recheck A1C in 3 months.  - POCT glycosylated hemoglobin (Hb A1C) - DULoxetine (CYMBALTA) 60 MG capsule; Take 1 capsule (60 mg total) by mouth daily.  Dispense: 90 capsule; Refill: 1 - gabapentin (NEURONTIN) 800 MG tablet; Take 1 tablet (800 mg total) by mouth 3 (three) times daily.  Dispense: 270 tablet; Refill: 1 - metFORMIN (GLUCOPHAGE) 500 MG tablet; Take 1 tablet (500 mg total) by mouth 2 (two) times daily with a meal.  Dispense: 180 tablet; Refill: 1  2. Essential hypertension, benign Stable with current medications, refills ordered.  - metoprolol tartrate (LOPRESSOR) 50 MG tablet; Take 1 tablet (50 mg total) by mouth 2 (two) times daily.  Dispense: 180 tablet; Refill: 1 - lisinopril (ZESTRIL) 20 MG tablet; Take 1 tablet (20 mg total) by mouth daily. Take one tab po qd  Dispense: 90 tablet; Refill: 1  3. Gastroesophageal reflux disease without esophagitis Stable with ompeprazole. Refill ordered.  - omeprazole (PRILOSEC) 20 MG capsule; Take 1 capsule (20 mg total) by mouth daily.  Dispense: 90 capsule; Refill: 1  4. Mixed hyperlipidemia Taking pravastatin and gemfibrozil. Refills ordered. - pravastatin (PRAVACHOL) 20 MG tablet; Take 1 tablet (20 mg total) by mouth daily.   Dispense: 90 tablet; Refill: 1 - gemfibrozil (LOPID) 600 MG tablet; Take 1 tablet (600 mg total) by mouth 2 (two) times daily before a meal.  Dispense: 180 tablet; Refill: 1   General Counseling: jahan friedlander understanding of the findings of todays visit and agrees with plan of treatment. I have discussed any further diagnostic evaluation that may be needed or ordered today. We also reviewed his medications today. he has been encouraged to call the office with any questions or concerns that should arise related to todays visit.    Orders Placed This Encounter  Procedures   POCT glycosylated hemoglobin (Hb A1C)    Meds ordered this encounter  Medications   pravastatin (PRAVACHOL) 20 MG tablet    Sig: Take 1 tablet (20 mg total) by mouth daily.    Dispense:  90 tablet    Refill:  1   DULoxetine (CYMBALTA) 60 MG capsule    Sig: Take 1 capsule (60 mg total) by mouth daily.    Dispense:  90 capsule    Refill:  1   gemfibrozil (LOPID) 600 MG tablet    Sig: Take 1 tablet (600 mg total) by mouth 2 (two) times daily before a meal.    Dispense:  180 tablet    Refill:  1   gabapentin (NEURONTIN) 800 MG tablet    Sig: Take 1 tablet (800 mg total) by mouth 3 (three) times daily.    Dispense:  270 tablet    Refill:  1   metFORMIN (GLUCOPHAGE) 500 MG tablet    Sig: Take 1 tablet (500 mg total) by mouth 2 (two) times daily with a meal.    Dispense:  180 tablet    Refill:  1   metoprolol tartrate (LOPRESSOR) 50 MG tablet    Sig: Take 1 tablet (50 mg total) by mouth 2 (two) times daily.    Dispense:  180 tablet    Refill:  1   omeprazole (PRILOSEC) 20 MG capsule    Sig: Take 1 capsule (20 mg total) by mouth  daily.    Dispense:  90 capsule    Refill:  1   lisinopril (ZESTRIL) 20 MG tablet    Sig: Take 1 tablet (20 mg total) by mouth daily. Take one tab po qd    Dispense:  90 tablet    Refill:  1    Return in about 3 months (around 11/20/2020) for F/U, Recheck A1C, Perle Brickhouse  PCP.   Total time spent:30 Minutes Time spent includes review of chart, medications, test results, and follow up plan with the patient.   [place] Controlled Substance Database was reviewed by me.  This patient was seen by Jonetta Osgood, FNP-C in collaboration with Dr. Clayborn Bigness as a part of collaborative care agreement.   Thang Flett R. Valetta Fuller, MSN, FNP-C Internal medicine

## 2020-09-22 ENCOUNTER — Ambulatory Visit: Payer: PPO | Admitting: Pharmacist

## 2020-09-22 DIAGNOSIS — E782 Mixed hyperlipidemia: Secondary | ICD-10-CM

## 2020-09-22 DIAGNOSIS — E1140 Type 2 diabetes mellitus with diabetic neuropathy, unspecified: Secondary | ICD-10-CM
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
## 2020-10-20 DIAGNOSIS — E1140 Type 2 diabetes mellitus with diabetic neuropathy, unspecified: Secondary | ICD-10-CM | POA: Diagnosis not present

## 2020-10-20 DIAGNOSIS — I10 Essential (primary) hypertension: Secondary | ICD-10-CM | POA: Diagnosis not present

## 2020-10-23 ENCOUNTER — Encounter: Payer: Self-pay | Admitting: Internal Medicine

## 2020-11-05 ENCOUNTER — Ambulatory Visit: Payer: Self-pay | Admitting: Urology

## 2020-11-08 ENCOUNTER — Encounter: Payer: Self-pay | Admitting: Urology

## 2020-11-08 ENCOUNTER — Ambulatory Visit: Payer: PPO | Admitting: Urology

## 2020-11-08 ENCOUNTER — Other Ambulatory Visit: Payer: Self-pay

## 2020-11-08 VITALS — BP 126/72 | HR 91 | Ht 69.0 in | Wt 180.0 lb

## 2020-11-08 DIAGNOSIS — N5201 Erectile dysfunction due to arterial insufficiency: Secondary | ICD-10-CM | POA: Diagnosis not present

## 2020-11-08 DIAGNOSIS — N401 Enlarged prostate with lower urinary tract symptoms: Secondary | ICD-10-CM

## 2020-11-08 MED ORDER — TAMSULOSIN HCL 0.4 MG PO CAPS: 0.4000 mg | ORAL_CAPSULE | Freq: Every day | ORAL | 3 refills | Status: DC

## 2020-11-08 NOTE — Progress Notes (Signed)
11/08/2020 2:19 PM   Shawn Greene 1950/04/17 [id number]  Referring provider: [name], MD [address] [place],  [place] [address]  Chief Complaint  Patient presents with   Erectile Dysfunction    HPI: 70 y.o. male presents for annual follow-up.  Initially seen 05/05/2020 for ED and lower urinary tract symptoms. IPSS 22/35 Started on tamsulosin and noted significant improvement in his voiding pattern Testosterone level 378 Second line treatment options were discussed for his ED and he wanted to hold off.  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 If you have any questions, ask your nurse or doctor.          aspirin EC 81 MG tablet Take 81 mg by mouth daily. Swallow whole.   blood glucose meter kit and supplies Kit Dispense based on patient and insurance preference. Use twice daily as directed   DULoxetine 60 MG capsule Commonly known as: CYMBALTA Take 1 capsule (60 mg total) by mouth daily.   gabapentin 800 MG tablet Commonly known as: NEURONTIN Take 1 tablet (800 mg total) by mouth 3 (three) times daily.   gemfibrozil 600 MG tablet Commonly known as: LOPID Take 1 tablet (600 mg total) by mouth 2 (two) times daily before a meal.   glucose blood test strip Use as instructed to test blood sugar twice daily   glyBURIDE 5 MG tablet Commonly known as: DIABETA Take 1 tablet (5  mg total) by mouth daily with supper.   Lancets 28G Misc by Does not apply route daily.   lisinopril 20 MG tablet Commonly known as: ZESTRIL Take 1 tablet (20 mg total) by mouth daily. Take one tab po qd   metFORMIN 500 MG tablet Commonly known as: GLUCOPHAGE Take 1 tablet (500 mg total) by mouth 2 (two) times daily with a meal.   metoprolol tartrate 50 MG tablet Commonly known as: LOPRESSOR Take 1 tablet (50 mg total) by mouth 2 (two) times daily.   omeprazole 20 MG capsule Commonly known as: PRILOSEC Take 1 capsule (20 mg total) by mouth daily.   pravastatin 20 MG tablet Commonly known as: PRAVACHOL Take 1 tablet (20 mg total) by mouth daily.   tamsulosin 0.4 MG Caps capsule Commonly known as: FLOMAX Take 1 capsule (0.4 mg total) by mouth daily.   terbinafine 250 MG tablet Commonly known as: LAMISIL Take 1 tablet (250 mg total) by mouth daily.        Allergies:  Allergies  Allergen Reactions   Pollinex-T [Modified Tree Tyrosine Adsorbate]     Family History: Family History  Problem Relation Age of Onset   Hypertension Mother     Social History:  reports that he has never smoked. He has never used smokeless tobacco. He reports current alcohol use of about  1.0 standard drink per week. He reports that he does not use drugs.   Physical Exam: BP 126/72   Pulse 91   Ht 5' 9"  (1.753 m)   Wt 180 lb (81.6 kg)   BMI 26.58 kg/m   Constitutional:  Alert and oriented, No acute distress. HEENT: [place] AT, moist mucus membranes.  Trachea midline, no masses. Cardiovascular: No clubbing, cyanosis, or edema. Respiratory: Normal respiratory effort, no increased work of breathing. GI: Abdomen is soft, nontender, nondistended, no abdominal masses Psychiatric: Normal mood and affect.   Assessment & Plan:    1.  BPH with LUTS Significant improvement on tamsulosin Refill sent to pharmacy  Follow-up 1 year  2.  Erectile dysfunction PDE 5 inhibitor refractory He may be  interested in a vacuum erection device.  He was provided literature and order form was signed as an [place], [place] [address], [place], [place] [address] [phone]

## 2020-11-10 ENCOUNTER — Encounter: Payer: Self-pay | Admitting: Urology

## 2020-11-19 ENCOUNTER — Ambulatory Visit: Payer: PPO | Admitting: Nurse Practitioner

## 2020-12-01 ENCOUNTER — Telehealth: Payer: Self-pay

## 2020-12-01 ENCOUNTER — Other Ambulatory Visit: Payer: Self-pay

## 2020-12-01 ENCOUNTER — Encounter: Payer: Self-pay | Admitting: Nurse Practitioner

## 2020-12-01 ENCOUNTER — Ambulatory Visit (INDEPENDENT_AMBULATORY_CARE_PROVIDER_SITE_OTHER): Payer: PPO | Admitting: Nurse Practitioner

## 2020-12-01 VITALS — BP 150/86 | HR 90 | Temp 98.3°F | Resp 16 | Ht 69.0 in | Wt 183.6 lb

## 2020-12-01 DIAGNOSIS — E782 Mixed hyperlipidemia: Secondary | ICD-10-CM | POA: Diagnosis not present

## 2020-12-01 DIAGNOSIS — Z1211 Encounter for screening for malignant neoplasm of colon: Secondary | ICD-10-CM

## 2020-12-01 DIAGNOSIS — I10 Essential (primary) hypertension: Secondary | ICD-10-CM | POA: Diagnosis not present

## 2020-12-01 DIAGNOSIS — E1140 Type 2 diabetes mellitus with diabetic neuropathy, unspecified: Secondary | ICD-10-CM | POA: Diagnosis not present

## 2020-12-01 DIAGNOSIS — Z1212 Encounter for screening for malignant neoplasm of rectum: Secondary | ICD-10-CM | POA: Diagnosis not present

## 2020-12-01 LAB — POCT GLYCOSYLATED HEMOGLOBIN (HGB A1C): Hemoglobin A1C: 6.4 % — AB (ref 4.0–5.6)

## 2020-12-01 MED ORDER — EZETIMIBE 10 MG PO TABS: 10.0000 mg | ORAL_TABLET | Freq: Every day | ORAL | 2 refills | Status: DC

## 2020-12-01 MED ORDER — PIOGLITAZONE HCL 15 MG PO TABS: 15.0000 mg | ORAL_TABLET | Freq: Every day | ORAL | 2 refills | Status: DC

## 2020-12-01 NOTE — Telephone Encounter (Signed)
Per patient and wife's request, gastroenterology referral sent via proficient to [facility] general surgery with Dr. Marlyn Corporal. I called office to verify he is still accepting patients-Toni

## 2020-12-01 NOTE — Progress Notes (Signed)
[facility] [place], [place] [address]  Internal MEDICINE  Office Visit Note  Patient Name: Shawn Greene  [id number]  [id number]  Date of Service: 12/01/2020  Chief Complaint  Patient presents with   Follow-up    Discuss meds   Diabetes   Hyperlipidemia   Hypertension   Quality Metric Gaps    colonoscopy    HPI Holt presents for a follow up visit for diabetes, hyperlipidemia and medication review. His A1C is 6.4 today which is improved from 6.9 in June 2022. He would like to stop taking metformin if possible. He does not like the side effects of metformin  He has stopped taking pravastatin due to having episodes of hypotension with dizziness/lightheadedness. He is still taking gemfibrozil.  -due for colorectal cancer screening. Wants to do colonoscopy and is requesting Dr. Bary Castilla.     Current Medication: Outpatient Encounter Medications as of 12/01/2020  Medication Sig   aspirin EC 81 MG tablet Take 81 mg by mouth daily. Swallow whole.   blood glucose meter kit and supplies KIT Dispense based on patient and insurance preference. Use twice daily as directed   DULoxetine (CYMBALTA) 60 MG capsule Take 1 capsule (60 mg total) by mouth daily.   ezetimibe (ZETIA) 10 MG tablet Take 1 tablet (10 mg total) by mouth daily.   gabapentin (NEURONTIN) 800 MG tablet Take 1 tablet (800 mg total) by mouth 3 (three) times daily.   gemfibrozil (LOPID) 600 MG tablet Take 1 tablet (600 mg total) by mouth 2 (two) times daily before a meal.   glucose blood test strip Use as instructed to test blood sugar twice daily   glyBURIDE (DIABETA) 5 MG tablet Take 1 tablet (5 mg total) by mouth daily with supper.   Lancets 28G MISC by Does not apply route daily.   lisinopril (ZESTRIL) 20 MG tablet Take 1 tablet (20 mg total) by mouth daily. Take one tab po qd   metoprolol tartrate (LOPRESSOR) 50 MG tablet Take 1 tablet (50 mg total) by mouth 2 (two) times daily.   omeprazole  (PRILOSEC) 20 MG capsule Take 1 capsule (20 mg total) by mouth daily.   pioglitazone (ACTOS) 15 MG tablet Take 1 tablet (15 mg total) by mouth daily.   tamsulosin (FLOMAX) 0.4 MG CAPS capsule Take 1 capsule (0.4 mg total) by mouth daily.   terbinafine (LAMISIL) 250 MG tablet Take 1 tablet (250 mg total) by mouth daily.   [DISCONTINUED] metFORMIN (GLUCOPHAGE) 500 MG tablet Take 1 tablet (500 mg total) by mouth 2 (two) times daily with a meal.   [DISCONTINUED] pravastatin (PRAVACHOL) 20 MG tablet Take 1 tablet (20 mg total) by mouth daily. (Patient not taking: Reported on 12/01/2020)   No facility-administered encounter medications on file as of 12/01/2020.    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     Family History: Family History  Problem Relation Age of Onset   Hypertension Mother     Social History   Socioeconomic History   Marital status: Married    Spouse name: Not on file   Number of children:  Not on file   Years of education: Not on file   Highest education level: Not on file  Occupational History   Not on file  Tobacco Use   Smoking status: Never   Smokeless tobacco: Never  Substance and Sexual Activity   Alcohol use: Yes    Alcohol/week: 1.0 standard drink    Types: 1 Cans of beer per week    Comment: occ   Drug use: No   Sexual activity: Not on file  Other Topics Concern   Not on file  Social History Narrative   Not on file   Social Determinants of Health   Financial Resource Strain: Low Risk    Difficulty of Paying Living Expenses: Not hard at all  Food Insecurity: Not on file  Transportation Needs: Not on  file  Physical Activity: Not on file  Stress: Not on file  Social Connections: Not on file  Intimate Partner Violence: Not on file      Review of Systems  Constitutional:  Negative for chills, fatigue and unexpected weight change.  HENT:  Negative for congestion, rhinorrhea, sneezing and sore throat.   Eyes:  Negative for redness.  Respiratory:  Negative for cough, chest tightness and shortness of breath.   Cardiovascular:  Negative for chest pain and palpitations.  Gastrointestinal:  Negative for abdominal pain, constipation, diarrhea, nausea and vomiting.  Genitourinary:  Negative for dysuria and frequency.  Musculoskeletal:  Negative for arthralgias, back pain, joint swelling and neck pain.  Skin:  Negative for rash.  Neurological: Negative.  Negative for tremors and numbness.  Hematological:  Negative for adenopathy. Does not bruise/bleed easily.  Psychiatric/Behavioral:  Negative for behavioral problems (Depression), sleep disturbance and suicidal ideas. The patient is not nervous/anxious.    Vital Signs: BP (!) 150/86   Pulse 90   Temp 98.3 F (36.8 C)   Resp 16   Ht 5' 9"  (1.753 m)   Wt 183 lb 9.6 oz (83.3 kg)   SpO2 98%   BMI 27.11 kg/m    Physical Exam Vitals reviewed.  Constitutional:      General: He is not in acute distress.    Appearance: Normal appearance. He is normal weight. He is not ill-appearing.  HENT:     Head: Normocephalic and atraumatic.  Eyes:     Extraocular Movements: Extraocular movements intact.     Pupils: Pupils are equal, round, and reactive to light.  Cardiovascular:     Rate and Rhythm: Normal rate and regular rhythm.  Pulmonary:     Effort: Pulmonary effort is normal. No respiratory distress.  Neurological:     Mental Status: He is alert and oriented to person, place, and time.     Cranial Nerves: No cranial nerve deficit.     Coordination: Coordination normal.     Gait: Gait normal.  Psychiatric:        Mood and Affect: Mood  normal.        Behavior: Behavior normal.       Assessment/Plan: 1. Type 2 diabetes mellitus with diabetic neuropathy, without long-term current use of insulin (HCC) A1C continues to improve. Discontinue metformin, start pioglitazone. Repeat A1C in 3 months.  - POCT HgB A1C - pioglitazone (ACTOS) 15 MG tablet; Take 1 tablet (15 mg total) by mouth daily.  Dispense: 30 tablet; Refill: 2  2. Primary hypertension Stable, continue current medications as prescribed.   3. Mixed hyperlipidemia Pravastatin discontinued, start ezetimibe. - ezetimibe (ZETIA) 10 MG tablet; Take 1 tablet (  10 mg total) by mouth daily.  Dispense: 30 tablet; Refill: 2  4. Screening for colorectal cancer Refer to GI for screening colonoscopy.  - Ambulatory referral to Gastroenterology   General Counseling: montel vanderhoof understanding of the findings of todays visit and agrees with plan of treatment. I have discussed any further diagnostic evaluation that may be needed or ordered today. We also reviewed his medications today. he has been encouraged to call the office with any questions or concerns that should arise related to todays visit.    Orders Placed This Encounter  Procedures   Ambulatory referral to Gastroenterology   POCT HgB A1C    Meds ordered this encounter  Medications   pioglitazone (ACTOS) 15 MG tablet    Sig: Take 1 tablet (15 mg total) by mouth daily.    Dispense:  30 tablet    Refill:  2   ezetimibe (ZETIA) 10 MG tablet    Sig: Take 1 tablet (10 mg total) by mouth daily.    Dispense:  30 tablet    Refill:  2    Return in 7 weeks (on 01/21/2021) for previously scheduled, CPE, eval new med, [facility] PCP.   Total time spent:20 Minutes Time spent includes review of chart, medications, test results, and follow up plan with the patient.   [place] Controlled Substance Database was reviewed by me.  This patient was seen by Jonetta Osgood, FNP-C in collaboration with Dr. Clayborn Bigness as a part  of collaborative care agreement.   Dione Mccombie R. Valetta Fuller, MSN, FNP-C Internal medicine

## 2020-12-03 DIAGNOSIS — R519 Headache, unspecified: Secondary | ICD-10-CM | POA: Diagnosis not present

## 2020-12-03 DIAGNOSIS — R42 Dizziness and giddiness: Secondary | ICD-10-CM | POA: Diagnosis not present

## 2020-12-03 DIAGNOSIS — G609 Hereditary and idiopathic neuropathy, unspecified: Secondary | ICD-10-CM | POA: Diagnosis not present

## 2020-12-03 DIAGNOSIS — R2689 Other abnormalities of gait and mobility: Secondary | ICD-10-CM | POA: Diagnosis not present

## 2020-12-06 ENCOUNTER — Telehealth: Payer: Self-pay | Admitting: Pharmacist

## 2020-12-06 NOTE — Progress Notes (Signed)
Chronic Care Management Pharmacy Assistant   Name: Shawn Greene  MRN: [id number] DOB: 1950-07-07  Reason for Encounter: Disease State For DM.    Conditions to be addressed/monitored: HTN, GERD, Type II DM, BPH, Osteoarthritis, HLD  Recent office visits:  12/01/20 Dr. Humphrey Rolls For follow-up. STARTED Ezetimibe 10 mg daily, Pioglitazone 15 mg daily. STOPPED Metformin.  Recent consult visits:  12/03/20 Neurology Ray Church, MD  For follow-up. No medication changes.  11/08/20 Urology Stoioff, Ronda Fairly, MD. For follow-up. No medication changes.  Hospital visits:  None since 09/22/20  Medications: Outpatient Encounter Medications as of 12/06/2020  Medication Sig   aspirin EC 81 MG tablet Take 81 mg by mouth daily. Swallow whole.   blood glucose meter kit and supplies KIT Dispense based on patient and insurance preference. Use twice daily as directed   DULoxetine (CYMBALTA) 60 MG capsule Take 1 capsule (60 mg total) by mouth daily.   ezetimibe (ZETIA) 10 MG tablet Take 1 tablet (10 mg total) by mouth daily.   gabapentin (NEURONTIN) 800 MG tablet Take 1 tablet (800 mg total) by mouth 3 (three) times daily.   gemfibrozil (LOPID) 600 MG tablet Take 1 tablet (600 mg total) by mouth 2 (two) times daily before a meal.   glucose blood test strip Use as instructed to test blood sugar twice daily   glyBURIDE (DIABETA) 5 MG tablet Take 1 tablet (5 mg total) by mouth daily with supper.   Lancets 28G MISC by Does not apply route daily.   lisinopril (ZESTRIL) 20 MG tablet Take 1 tablet (20 mg total) by mouth daily. Take one tab po qd   metoprolol tartrate (LOPRESSOR) 50 MG tablet Take 1 tablet (50 mg total) by mouth 2 (two) times daily.   omeprazole (PRILOSEC) 20 MG capsule Take 1 capsule (20 mg total) by mouth daily.   pioglitazone (ACTOS) 15 MG tablet Take 1 tablet (15 mg total) by mouth daily.   tamsulosin (FLOMAX) 0.4 MG CAPS capsule Take 1 capsule (0.4 mg total) by mouth daily.    terbinafine (LAMISIL) 250 MG tablet Take 1 tablet (250 mg total) by mouth daily.   No facility-administered encounter medications on file as of 12/06/2020.   Recent Relevant Labs: Lab Results  Component Value Date/Time   HGBA1C 6.4 (A) 12/01/2020 08:50 AM   HGBA1C 6.9 (A) 08/20/2020 09:45 AM   HGBA1C 6.6 (H) 01/22/2020 09:04 AM    Kidney Function Lab Results  Component Value Date/Time   CREATININE 1.01 07/14/2020 01:18 PM   CREATININE 1.50 (H) 06/13/2020 05:44 AM   GFRNONAA 50 (L) 06/13/2020 05:44 AM   GFRAA 84 01/22/2020 09:04 AM    Current antihyperglycemic regimen:  Glyburide 46m daily with supper  What recent interventions/DTPs have been made to improve glycemic control:  STOPPED Metformin.  Have there been any recent hospitalizations or ED visits since last visit with CPP? Patient stated no.   Patient denies hypoglycemic symptoms, including None  Patient denies hyperglycemic symptoms, including none  How often are you checking your blood sugar? Patient stated once daily  What are your blood sugars ranging?  Patient stated his blood sugars are within normal range.   During the week, how often does your blood glucose drop below 70?  Patient stated Never  Are you checking your feet daily/regularly?  Patient reminded to monitor his feet regularly.   Adherence Review: Is the patient currently on a STATIN medication? N/A.  Is the patient currently on ACE/ARB medication? Lisinopril  20 mg  Does the patient have >5 day gap between last estimated fill dates? Per misc rpts, no.   Care Gaps:Patient is due for his AWV, (Scheduled for 01/21/21)  Star Rating Drugs:Pioglitazone 15 mg 12/01/20 30 DS, Lisinopril 20 mg 10/04/20 90 DS.  Follow-Up:Pharmacist Review  Charlann Lange, [place] Pharmacist Assistant [phone]

## 2020-12-20 DIAGNOSIS — E1140 Type 2 diabetes mellitus with diabetic neuropathy, unspecified: Secondary | ICD-10-CM | POA: Diagnosis not present

## 2020-12-20 DIAGNOSIS — I10 Essential (primary) hypertension: Secondary | ICD-10-CM | POA: Diagnosis not present

## 2020-12-23 ENCOUNTER — Other Ambulatory Visit: Payer: Self-pay | Admitting: General Surgery

## 2020-12-23 DIAGNOSIS — Z1211 Encounter for screening for malignant neoplasm of colon: Secondary | ICD-10-CM | POA: Diagnosis not present

## 2020-12-23 NOTE — Progress Notes (Signed)
Subjective:     Patient ID: Shawn Greene is a 70 y.o. male.   HPI   The following portions of the patient's history were reviewed and updated as appropriate.   This an established patient is here today for: office visit. He is here to discuss having a colonoscopy. His last colonoscopy was done in 2012 with [facility]. Patient reports he has bowel movements once per day. The patient denies any rectal bleeding or mucus.    Patient did have a colon resection by Dr. Burt Knack on 05-25-10.   The patient is accompanied today by his wife, Shawn Greene.         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             Past Surgical History:  Procedure Laterality Date   ankle surgery       COLON SURGERY   2012   COLONOSCOPY   2012   REPLACEMENT TOTAL KNEE Left 2020   REPLACEMENT TOTAL KNEE Right 2021          Social History           Socioeconomic History   Marital status: Married  Tobacco Use   Smoking status: Never Smoker   Smokeless tobacco: Never Used  Substance and Sexual Activity   Alcohol use: Yes      Comment: occassionally   Drug use: No        No Known Allergies   Current Medications        Current Outpatient Medications  Medication Sig Dispense Refill   aspirin 81 MG EC tablet Take 81 mg by mouth once daily.       DULoxetine (CYMBALTA) 60 MG DR capsule Take 1 capsule (60 mg total) by mouth every morning 90 capsule 3   ezetimibe (ZETIA) 10 mg tablet         gabapentin (NEURONTIN) 800 MG tablet Take 800 mg by mouth 3 (three) times daily       gemfibrozil (LOPID) 600 mg  tablet Take 600 mg by mouth 2 (two) times daily before meals.       glimepiride (AMARYL) 1 MG tablet         glyBURIDE (DIABETA) 5 MG tablet         lisinopril (PRINIVIL,ZESTRIL) 10 MG tablet Take 10 mg by mouth once daily.       meloxicam (MOBIC) 15 MG tablet Take 15 mg by mouth once daily       metoprolol succinate (TOPROL-XL) 50 MG XL tablet Take 50 mg by mouth once daily.       metoprolol tartrate (LOPRESSOR) 50 MG tablet Take 1 tablet by mouth 2 (two) times daily       omeprazole (PRILOSEC) 20 MG DR capsule         pioglitazone (ACTOS) 15 MG tablet Take 1 tablet by mouth once  daily       tamsulosin (FLOMAX) 0.4 mg capsule Take by mouth       traMADoL (ULTRAM) 50 mg tablet every 4 (four) hours tramadol 50 mg tablet Take 1 tablet every 4 hours by oral route as needed. (Patient not taking: No sig reported)        No current facility-administered medications for this visit.             Family History  Problem Relation Age of Onset   High blood pressure (Hypertension) Mother     Colon cancer Neg Hx     Colon polyps Neg Hx     Breast cancer Neg Hx          Labs and Radiology:      May 24, 2020 pathology:   Diagnosis:  SIGMOID COLON RESECTION :  SEGMENT OF COLON WITH DIVERTICULITIS AND MARKED ASSOCIATED  TRANSMURAL INFLAMMATION AND FIBROSIS.  NEGATIVE FOR DYSPLASIA AND MALIGNANCY.  THE SPECIMEN MARGINS APPEAR CLEAR.  ONE BENIGN LYMPH NODE. NO EVIDENCE OF MALIGNANCY.    Received in a formalin filled container labeled Keigen Caddell is a  previously partially opened portion of large bowel measuring 9 cm  in length and ranging from 3.5 to 4.5 cm in circumference. The  serosa is covered by pericolonic fat which in one area is  markedly firm, nodular and has a small amount of exudate.   Colonoscopy January 03, 2011:   Images no longer available.  Normal by patient report.   Laboratory review:   Component Ref Range & Units 3 wk ago  (12/01/20) 4 mo ago  (08/20/20) 5 mo ago   (07/21/20) 8 mo ago  (04/20/20) 11 mo ago  (01/22/20) 1 yr ago  (12/02/19) 1 yr ago  (09/01/19)  Hemoglobin A1C 4.0 - 5.6 % 6.4 Abnormal   6.9 Abnormal   7.5 Abnormal   6.7 Abnormal   6.6 High  R, CM  6.3 Abnormal   7.2 Abnormal      May 24, 2020 laboratory:   Component Ref Range & Units 6 mo ago   WBC 4.0 - 10.5 K/uL 6.6   RBC 4.22 - 5.81 MIL/uL 4.14 Low    Hemoglobin 13.0 - 17.0 g/dL 12.1 Low    HCT 39.0 - 52.0 % 36.0 Low    MCV 80.0 - 100.0 fL 87.0   MCH 26.0 - 34.0 pg 29.2   MCHC 30.0 - 36.0 g/dL 33.6   RDW 11.5 - 15.5 % 13.0   Platelets 150 - 400 K/uL 291   nRBC 0.0 - 0.2 % 0.0     Component Ref Range & Units 6 mo ago   Sodium 135 - 145 mmol/L 140   Potassium 3.5 - 5.1 mmol/L 4.7   Chloride 98 - 111 mmol/L 107   CO2 22 - 32 mmol/L 23   Glucose, Bld 70 - 99 mg/dL 126 High    Comment: Glucose reference range applies only to samples taken after fasting for at least 8 hours.  BUN 8 - 23 mg/dL 29 High    Creatinine, Ser 0.61 - 1.24 mg/dL 1.50 High    Calcium 8.9 - 10.3 mg/dL 9.0   GFR, Estimated >60 mL/min 50 Low            Review of Systems  Constitutional: Negative for chills and fever.  Respiratory: Negative for cough.          Objective:   Physical Exam Constitutional:  Appearance: Normal appearance.  Cardiovascular:     Rate and Rhythm: Normal rate and regular rhythm.     Pulses: Normal pulses.     Heart sounds: Normal heart sounds.  Pulmonary:     Effort: Pulmonary effort is normal.     Breath sounds: Normal breath sounds.  Musculoskeletal:     Cervical back: Neck supple.  Skin:    General: Skin is warm and dry.  Neurological:     Mental Status: He is alert and oriented to person, place, and time.  Psychiatric:        Mood and Affect: Mood normal.        Behavior: Behavior normal.           Assessment:     Candidate for colon cancer screening.      Plan:     Options for screening reviewed 1) colonoscopy versus 2) Cologuard.  Pros and cons  of each reviewed.  Risks associated with colonoscopy discussed.   The patient is interested in colonoscopy.  Will have him hold glyburide and glimepiride the day of the prep and procedure.  He will continue his usual dose of Actos.    This note is partially prepared by Ledell Noss, CMA acting as a scribe in the presence of Dr. Hervey Ard, MD.    The documentation recorded by the scribe accurately reflects the service I personally performed and the decisions made by me.    Robert Bellow, MD FACS

## 2020-12-27 ENCOUNTER — Other Ambulatory Visit: Payer: Self-pay | Admitting: General Surgery

## 2020-12-27 NOTE — Progress Notes (Unsigned)
Subjective:     Patient ID: Shawn Greene is a 70 y.o. male.   HPI   The following portions of the patient's history were reviewed and updated as appropriate.   This an established patient is here today for: office visit. He is here to discuss having a colonoscopy. His last colonoscopy was done in 2012 with [facility]. Patient reports he has bowel movements once per day. The patient denies any rectal bleeding or mucus.    Patient did have a colon resection by Dr. Burt Knack on 05-25-10.   The patient is accompanied today by his wife, Shawn Greene.         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       DULoxetine (CYMBALTA) 60 MG DR capsule Take 1 capsule (60 mg total) by mouth every morning 90 capsule 3   ezetimibe (ZETIA) 10 mg tablet         gabapentin (NEURONTIN) 800 MG tablet Take 800 mg by mouth 3 (three) times daily       gemfibrozil (LOPID) 600 mg tablet Take 600 mg by mouth 2  (two) times daily before meals.       glimepiride (AMARYL) 1 MG tablet         glyBURIDE (DIABETA) 5 MG tablet         lisinopril (PRINIVIL,ZESTRIL) 10 MG tablet Take 10 mg by mouth once daily.       meloxicam (MOBIC) 15 MG tablet Take 15 mg by mouth once daily       metoprolol succinate (TOPROL-XL) 50 MG XL tablet Take 50 mg by mouth once daily.       metoprolol tartrate (LOPRESSOR) 50 MG tablet Take 1 tablet by mouth 2 (two) times daily       omeprazole (PRILOSEC) 20 MG DR capsule         pioglitazone (ACTOS) 15 MG tablet Take 1 tablet by mouth once daily  tamsulosin (FLOMAX) 0.4 mg capsule Take by mouth       traMADoL (ULTRAM) 50 mg tablet every 4 (four) hours tramadol 50 mg tablet Take 1 tablet every 4 hours by oral route as needed. (Patient not taking: No sig reported)        No current facility-administered medications for this visit.           Family History  Problem Relation Age of Onset   High blood pressure (Hypertension) Mother     Colon cancer Neg Hx     Colon polyps Neg Hx     Breast cancer Neg Hx          Labs and Radiology:      May 24, 2020 pathology:   Diagnosis:  SIGMOID COLON RESECTION :  SEGMENT OF COLON WITH DIVERTICULITIS AND MARKED ASSOCIATED  TRANSMURAL INFLAMMATION AND FIBROSIS.  NEGATIVE FOR DYSPLASIA AND MALIGNANCY.  THE SPECIMEN MARGINS APPEAR CLEAR.  ONE BENIGN LYMPH NODE. NO EVIDENCE OF MALIGNANCY.    Received in a formalin filled container labeled Melford Tullier is a  previously partially opened portion of large bowel measuring 9 cm  in length and ranging from 3.5 to 4.5 cm in circumference. The  serosa is covered by pericolonic fat which in one area is  markedly firm, nodular and has a small amount of exudate.   Colonoscopy January 03, 2011:   Images no longer available.  Normal by patient report.   Laboratory review:   Component Ref Range & Units 3 wk ago  (12/01/20) 4 mo ago  (08/20/20) 5 mo ago  (07/21/20) 8 mo ago  (04/20/20) 11  mo ago  (01/22/20) 1 yr ago  (12/02/19) 1 yr ago  (09/01/19)  Hemoglobin A1C 4.0 - 5.6 % 6.4 Abnormal   6.9 Abnormal   7.5 Abnormal   6.7 Abnormal   6.6 High  R, CM  6.3 Abnormal   7.2 Abnormal      May 24, 2020 laboratory:   Component Ref Range & Units 6 mo ago   WBC 4.0 - 10.5 K/uL 6.6   RBC 4.22 - 5.81 MIL/uL 4.14 Low    Hemoglobin 13.0 - 17.0 g/dL 12.1 Low    HCT 39.0 - 52.0 % 36.0 Low    MCV 80.0 - 100.0 fL 87.0   MCH 26.0 - 34.0 pg 29.2   MCHC 30.0 - 36.0 g/dL 33.6   RDW 11.5 - 15.5 % 13.0   Platelets 150 - 400 K/uL 291   nRBC 0.0 - 0.2 % 0.0     Component Ref Range & Units 6 mo ago   Sodium 135 - 145 mmol/L 140   Potassium 3.5 - 5.1 mmol/L 4.7   Chloride 98 - 111 mmol/L 107   CO2 22 - 32 mmol/L 23   Glucose, Bld 70 - 99 mg/dL 126 High    Comment: Glucose reference range applies only to samples taken after fasting for at least 8 hours.  BUN 8 - 23 mg/dL 29 High    Creatinine, Ser 0.61 - 1.24 mg/dL 1.50 High    Calcium 8.9 - 10.3 mg/dL 9.0   GFR, Estimated >60 mL/min 50 Low            Review of Systems  Constitutional: Negative for chills and fever.  Respiratory: Negative for cough.          Objective:   Physical Exam Constitutional:      Appearance: Normal appearance.  Cardiovascular:  Rate and Rhythm: Normal rate and regular rhythm.     Pulses: Normal pulses.     Heart sounds: Normal heart sounds.  Pulmonary:     Effort: Pulmonary effort is normal.     Breath sounds: Normal breath sounds.  Musculoskeletal:     Cervical back: Neck supple.  Skin:    General: Skin is warm and dry.  Neurological:     Mental Status: He is alert and oriented to person, place, and time.  Psychiatric:        Mood and Affect: Mood normal.        Behavior: Behavior normal.           Assessment:     Candidate for colon cancer screening.      Plan:     Options for screening reviewed 1) colonoscopy versus 2) Cologuard.  Pros and cons of each reviewed.  Risks  associated with colonoscopy discussed.   The patient is interested in colonoscopy.  Will have him hold glyburide and glimepiride the day of the prep and procedure.  He will continue his usual dose of Actos.    This note is partially prepared by Ledell Noss, CMA acting as a scribe in the presence of Dr. Hervey Ard, MD.    The documentation recorded by the scribe accurately reflects the service I personally performed and the decisions made by me.    Robert Bellow, MD FACS

## 2021-01-05 ENCOUNTER — Other Ambulatory Visit: Payer: Self-pay

## 2021-01-05 DIAGNOSIS — E1140 Type 2 diabetes mellitus with diabetic neuropathy, unspecified: Secondary | ICD-10-CM

## 2021-01-05 MED ORDER — GLYBURIDE 5 MG PO TABS: 5.0000 mg | ORAL_TABLET | Freq: Every day | ORAL | 2 refills | Status: DC

## 2021-01-12 ENCOUNTER — Ambulatory Visit: Payer: PPO | Admitting: Anesthesiology

## 2021-01-12 ENCOUNTER — Encounter: Payer: Self-pay | Admitting: General Surgery

## 2021-01-12 ENCOUNTER — Ambulatory Visit
Admission: RE | Admit: 2021-01-12 | Discharge: 2021-01-12 | Disposition: A | Discharge status: Home / Self Care | Payer: PPO | Attending: General Surgery | Admitting: General Surgery

## 2021-01-12 ENCOUNTER — Encounter
Admission: RE | Disposition: A | Discharge status: Home / Self Care | Payer: Self-pay | Source: Home / Self Care | Attending: General Surgery

## 2021-01-12 DIAGNOSIS — E1140 Type 2 diabetes mellitus with diabetic neuropathy, unspecified: Secondary | ICD-10-CM | POA: Diagnosis not present

## 2021-01-12 DIAGNOSIS — Z1211 Encounter for screening for malignant neoplasm of colon: Secondary | ICD-10-CM | POA: Diagnosis not present

## 2021-01-12 DIAGNOSIS — Z9049 Acquired absence of other specified parts of digestive tract: Secondary | ICD-10-CM | POA: Insufficient documentation

## 2021-01-12 DIAGNOSIS — K219 Gastro-esophageal reflux disease without esophagitis: Secondary | ICD-10-CM | POA: Insufficient documentation

## 2021-01-12 HISTORY — PX: COLONOSCOPY WITH PROPOFOL: SHX5780

## 2021-01-12 LAB — GLUCOSE, CAPILLARY: Glucose-Capillary: 126 mg/dL — ABNORMAL HIGH (ref 70–99)

## 2021-01-12 SURGERY — COLONOSCOPY WITH PROPOFOL
Anesthesia: General

## 2021-01-12 MED ORDER — PROPOFOL 10 MG/ML IV BOLUS
INTRAVENOUS | Status: AC
  Filled 2021-01-12: qty 20

## 2021-01-12 MED ORDER — SODIUM CHLORIDE 0.9 % IV SOLN: INTRAVENOUS | Status: DC

## 2021-01-12 MED ORDER — LIDOCAINE 2% (20 MG/ML) 5 ML SYRINGE
INTRAMUSCULAR | Status: DC | PRN
  Administered 2021-01-12: 60 mg via INTRAVENOUS

## 2021-01-12 MED ORDER — PROPOFOL 500 MG/50ML IV EMUL
INTRAVENOUS | Status: DC | PRN
  Administered 2021-01-12: 150 ug/kg/min via INTRAVENOUS

## 2021-01-12 MED ORDER — LIDOCAINE HCL (PF) 2 % IJ SOLN
INTRAMUSCULAR | Status: AC
  Filled 2021-01-12: qty 5

## 2021-01-12 MED ORDER — PROPOFOL 10 MG/ML IV BOLUS
INTRAVENOUS | Status: DC | PRN
  Administered 2021-01-12: 70 mg via INTRAVENOUS

## 2021-01-12 NOTE — Op Note (Signed)
[facility] Gastroenterology Patient Name: Shawn Greene Procedure Date: 01/12/2021 10:37 AM MRN: [id number] Account #: [id number] Date of Birth: [birth year] Admit Type: Outpatient Age: 70 Room: [facility] ENDO ROOM 1 Gender: Male Note Status: Finalized Instrument Name: Peds Colonoscope [id number] Procedure:             Colonoscopy Indications:           Screening for colorectal malignant neoplasm Providers:             Robert Bellow, MD Referring MD:          [name], MD (Referring MD) Medicines:             Propofol per Anesthesia Complications:         No immediate complications. Procedure:             Pre-Anesthesia Assessment:                        - Prior to the procedure, a History and Physical was                         performed, and patient medications, allergies and                         sensitivities were reviewed. The patient's tolerance                         of previous anesthesia was reviewed.                        - The risks and benefits of the procedure and the                         sedation options and risks were discussed with the                         patient. All questions were answered and informed                         consent was obtained.                        After obtaining informed consent, the colonoscope was                         passed under direct vision. Throughout the procedure,                         the patient's blood pressure, pulse, and oxygen                         saturations were monitored continuously. The                         Colonoscope was introduced through the anus and                         advanced to the the terminal ileum. The colonoscopy  was performed without difficulty. The patient                         tolerated the procedure well. The quality of the bowel                         preparation was excellent. Findings:      The entire examined colon appeared normal  on direct and retroflexion       views. Impression:            - The entire examined colon is normal on direct and                         retroflexion views.                        - No specimens collected. Recommendation:        - Discharge patient to home (via wheelchair). Procedure Code(s):     --- Professional ---                        [phone], Colonoscopy, flexible; diagnostic, including                         collection of specimen(s) by brushing or washing, when                         performed (separate procedure) Diagnosis Code(s):     --- Professional ---                        Z12.11, Encounter for screening for malignant neoplasm                         of colon CPT copyright 2019 [employer]. All rights reserved. The codes documented in this report are preliminary and upon coder review may  be revised to meet current compliance requirements. Robert Bellow, MD 01/12/2021 11:07:54 AM This report has been signed electronically. Number of Addenda: 0 Note Initiated On: 01/12/2021 10:37 AM Scope Withdrawal Time: 0 hours 9 minutes 49 seconds  Total Procedure Duration: 0 hours 12 minutes 55 seconds  Estimated Blood Loss:  Estimated blood loss: none.      [facility]

## 2021-01-12 NOTE — H&P (Signed)
Shawn Greene [id number] 11/13/1950     HPI: Healthy 70 y/o male for screening colonoscopy. Tolerated prep with some bloating, but no N/V.  Post sigmoid colectomy in 2012.    Medications Prior to Admission  Medication Sig Dispense Refill Last Dose   aspirin EC 81 MG tablet Take 81 mg by mouth daily. Swallow whole.   01/11/2021   DULoxetine (CYMBALTA) 60 MG capsule Take 1 capsule (60 mg total) by mouth daily. 90 capsule 1 01/11/2021   ezetimibe (ZETIA) 10 MG tablet Take 1 tablet (10 mg total) by mouth daily. 30 tablet 2 01/11/2021   gabapentin (NEURONTIN) 800 MG tablet Take 1 tablet (800 mg total) by mouth 3 (three) times daily. 270 tablet 1 01/11/2021   gemfibrozil (LOPID) 600 MG tablet Take 1 tablet (600 mg total) by mouth 2 (two) times daily before a meal. 180 tablet 1 01/11/2021   lisinopril (ZESTRIL) 20 MG tablet Take 1 tablet (20 mg total) by mouth daily. Take one tab po qd 90 tablet 1 01/11/2021   metoprolol tartrate (LOPRESSOR) 50 MG tablet Take 1 tablet (50 mg total) by mouth 2 (two) times daily. 180 tablet 1 01/11/2021   omeprazole (PRILOSEC) 20 MG capsule Take 1 capsule (20 mg total) by mouth daily. 90 capsule 1 01/11/2021   pioglitazone (ACTOS) 15 MG tablet Take 1 tablet (15 mg total) by mouth daily. 30 tablet 2 Past Week   tamsulosin (FLOMAX) 0.4 MG CAPS capsule Take 1 capsule (0.4 mg total) by mouth daily. 90 capsule 3 01/11/2021   blood glucose meter kit and supplies KIT Dispense based on patient and insurance preference. Use twice daily as directed 1 each 3    glucose blood test strip Use as instructed to test blood sugar twice daily 100 each 3    glyBURIDE (DIABETA) 5 MG tablet Take 1 tablet (5 mg total) by mouth daily with supper. 90 tablet 2    Lancets 28G MISC by Does not apply route daily.      terbinafine (LAMISIL) 250 MG tablet Take 1 tablet (250 mg total) by mouth daily. 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    Past Surgical History:  Procedure Laterality Date   ANKLE SURGERY     COLECTOMY  2012   Social History   Socioeconomic History   Marital status: Married    Spouse name: Not on file   Number of children: Not on file   Years of education: Not on file   Highest education level: Not on file  Occupational History   Not on file  Tobacco Use   Smoking status: Never   Smokeless tobacco: Never  Substance and Sexual Activity   Alcohol use: Yes    Alcohol/week: 1.0 standard drink    Types: 1 Cans of beer per week    Comment: occ   Drug use: No   Sexual activity: Not on file  Other Topics Concern   Not on file  Social History Narrative   Not on file   Social Determinants of Health   Financial Resource Strain: Low Risk    Difficulty of Paying Living Expenses: Not hard at  all  Food Insecurity: Not on file  Transportation Needs: Not on file  Physical Activity: Not on file  Stress: Not on file  Social Connections: Not on file  Intimate Partner Violence: Not on file   Social History   Social History Narrative   Not on file     ROS: Negative.     PE: HEENT: Negative. Lungs: Clear. Cardio: RR.   Assessment/Plan:  Proceed with planned endoscopy.  [facility] 01/12/2021

## 2021-01-12 NOTE — Transfer of Care (Signed)
Immediate Anesthesia Transfer of Care Note  Patient: Shawn Greene  Procedure(s) Performed: COLONOSCOPY WITH PROPOFOL  Patient Location: Endoscopy Unit  Anesthesia Type:General  Level of Consciousness: drowsy  Airway & Oxygen Therapy: Patient Spontanous Breathing  Post-op Assessment: Report given to RN and Post -op Vital signs reviewed and stable  Post vital signs: stable  Last Vitals:  Vitals Value Taken Time  BP 115/65 01/12/21 1110  Temp 35.7 C 01/12/21 1110  Pulse 83 01/12/21 1110  Resp 25 01/12/21 1110  SpO2 96 % 01/12/21 1110  Vitals shown include unvalidated device data.  Last Pain:  Vitals:   01/12/21 1110  TempSrc: Temporal  PainSc:          Complications: No notable events documented.

## 2021-01-12 NOTE — Anesthesia Preprocedure Evaluation (Signed)
Anesthesia Evaluation  Patient identified by MRN, date of birth, ID band Patient awake    Reviewed: Allergy & Precautions, NPO status , Patient's Chart, lab work & pertinent test results  Airway Mallampati: II  TM Distance: >3 FB Neck ROM: full    Dental  (+) Teeth Intact   Pulmonary neg pulmonary ROS,    Pulmonary exam normal breath sounds clear to auscultation       Cardiovascular Exercise Tolerance: Good hypertension, Pt. on medications negative cardio ROS Normal cardiovascular exam Rhythm:Regular     Neuro/Psych  Headaches, negative neurological ROS  negative psych ROS   GI/Hepatic negative GI ROS, Neg liver ROS, GERD  ,  Endo/Other  negative endocrine ROSdiabetes, Well Controlled  Renal/GU negative Renal ROS  negative genitourinary   Musculoskeletal negative musculoskeletal ROS (+)   Abdominal Normal abdominal exam  (+)   Peds negative pediatric ROS (+)  Hematology negative hematology ROS (+)   Anesthesia Other Findings Past Medical History: No date: Chronic tension-type headache, not intractable No date: Essential (primary) hypertension No date: Mixed hyperlipidemia No date: Psoriasis No date: Type 2 diabetes, controlled, with neuropathy (HCC) No date: 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  Past Surgical History: No date: ANKLE SURGERY 2012: COLECTOMY  BMI    Body Mass Index: 26.58 kg/m      Reproductive/Obstetrics negative OB ROS                             Anesthesia Physical Anesthesia Plan  ASA: 3  Anesthesia Plan: General   Post-op Pain Management:    Induction:   PONV Risk Score and Plan: Propofol infusion and TIVA  Airway Management Planned: Nasal Cannula  Additional Equipment:   Intra-op Plan:   Post-operative Plan:   Informed Consent: I have reviewed the patients History and Physical, chart, labs and discussed the procedure including the  risks, benefits and alternatives for the proposed anesthesia with the patient or authorized representative who has indicated his/her understanding and acceptance.     Dental Advisory Given  Plan Discussed with: CRNA and Surgeon  Anesthesia Plan Comments:         Anesthesia Quick Evaluation

## 2021-01-19 ENCOUNTER — Telehealth: Payer: Self-pay

## 2021-01-19 NOTE — Anesthesia Postprocedure Evaluation (Signed)
Anesthesia Post Note  Patient: Shawn Greene  Procedure(s) Performed: COLONOSCOPY WITH PROPOFOL  Patient location during evaluation: PACU Anesthesia Type: General Level of consciousness: awake Pain management: pain level controlled Vital Signs Assessment: post-procedure vital signs reviewed and stable Respiratory status: spontaneous breathing and nonlabored ventilation Cardiovascular status: blood pressure returned to baseline Anesthetic complications: no   No notable events documented.   Last Vitals:  Vitals:   01/12/21 1130 01/12/21 1137  BP: 123/82 120/78  Pulse: (!) 194 80  Resp: 17   Temp:    SpO2: (!) 85%     Last Pain:  Vitals:   01/12/21 1120  TempSrc:   PainSc: 0-No pain                 VAN STAVEREN,Fleta Borgeson

## 2021-01-19 NOTE — Telephone Encounter (Signed)
Left vm to confirm 01/21/21 appointment-Toni

## 2021-01-21 ENCOUNTER — Ambulatory Visit (INDEPENDENT_AMBULATORY_CARE_PROVIDER_SITE_OTHER): Payer: PPO | Admitting: Nurse Practitioner

## 2021-01-21 ENCOUNTER — Encounter: Payer: Self-pay | Admitting: Nurse Practitioner

## 2021-01-21 ENCOUNTER — Other Ambulatory Visit: Payer: Self-pay

## 2021-01-21 VITALS — BP 139/64 | HR 76 | Temp 98.4°F | Resp 16 | Ht 69.0 in | Wt 189.0 lb

## 2021-01-21 DIAGNOSIS — R300 Dysuria: Secondary | ICD-10-CM

## 2021-01-21 DIAGNOSIS — E782 Mixed hyperlipidemia: Secondary | ICD-10-CM

## 2021-01-21 DIAGNOSIS — Z0001 Encounter for general adult medical examination with abnormal findings: Secondary | ICD-10-CM | POA: Diagnosis not present

## 2021-01-21 DIAGNOSIS — K219 Gastro-esophageal reflux disease without esophagitis: Secondary | ICD-10-CM

## 2021-01-21 DIAGNOSIS — E559 Vitamin D deficiency, unspecified: Secondary | ICD-10-CM

## 2021-01-21 DIAGNOSIS — I10 Essential (primary) hypertension: Secondary | ICD-10-CM | POA: Diagnosis not present

## 2021-01-21 DIAGNOSIS — E1140 Type 2 diabetes mellitus with diabetic neuropathy, unspecified: Secondary | ICD-10-CM | POA: Diagnosis not present

## 2021-01-21 MED ORDER — LISINOPRIL 20 MG PO TABS: 20.0000 mg | ORAL_TABLET | Freq: Every day | ORAL | 1 refills | Status: DC

## 2021-01-21 MED ORDER — EZETIMIBE 10 MG PO TABS: 10.0000 mg | ORAL_TABLET | Freq: Every day | ORAL | 1 refills | Status: DC

## 2021-01-21 MED ORDER — METOPROLOL TARTRATE 50 MG PO TABS: 50.0000 mg | ORAL_TABLET | Freq: Two times a day (BID) | ORAL | 1 refills | Status: DC

## 2021-01-21 MED ORDER — PIOGLITAZONE HCL 15 MG PO TABS: 15.0000 mg | ORAL_TABLET | Freq: Every day | ORAL | 1 refills | Status: DC

## 2021-01-21 MED ORDER — GLYBURIDE 5 MG PO TABS: 5.0000 mg | ORAL_TABLET | Freq: Two times a day (BID) | ORAL | 2 refills | Status: DC

## 2021-01-21 MED ORDER — DULOXETINE HCL 60 MG PO CPEP
60.0000 mg | ORAL_CAPSULE | Freq: Every day | ORAL | 1 refills | Status: DC
Start: 2021-01-21 — End: 2021-07-21

## 2021-01-21 MED ORDER — GABAPENTIN 800 MG PO TABS: 800.0000 mg | ORAL_TABLET | Freq: Three times a day (TID) | ORAL | 1 refills | Status: DC

## 2021-01-21 MED ORDER — OMEPRAZOLE 20 MG PO CPDR
20.0000 mg | DELAYED_RELEASE_CAPSULE | Freq: Every day | ORAL | 1 refills | Status: DC
Start: 2021-01-21 — End: 2021-07-21

## 2021-01-21 MED ORDER — GEMFIBROZIL 600 MG PO TABS
600.0000 mg | ORAL_TABLET | Freq: Two times a day (BID) | ORAL | 1 refills | Status: DC
Start: 2021-01-21 — End: 2021-07-21

## 2021-01-21 NOTE — Progress Notes (Signed)
[facility] [place], [place] [address]  Internal MEDICINE  Office Visit Note  Patient Name: Shawn Greene  [id number]  [id number]  Date of Service: 01/21/2021  Chief Complaint  Patient presents with   Medicare Wellness    Discuss meds    HPI Jordi presents for an annual well visit and physical exam.  He is a well-appearing 70 year old male and is accompanied by his wife to his office visit today.  He is a type II diabetic currently taking Actos and glyburide daily.  His last A1c was 6.4 in October and he is due to have his A1c repeated in January.  He also has hyperlipidemia and is currently taking gemfibrozil and ezetimibe.  He has chronic hypertension and takes lisinopril and metoprolol.  He had his last routine colonoscopy this year and the gastroenterologist did not recommend that he have any more in the future.  The patient is requesting refills and is also due for routine labs.  He denies any pain today and has no other questions or concerns.    Current Medication: Outpatient Encounter Medications as of 01/21/2021  Medication Sig   aspirin EC 81 MG tablet Take 81 mg by mouth daily. Swallow whole.   blood glucose meter kit and supplies KIT Dispense based on patient and insurance preference. Use twice daily as directed   glucose blood test strip Use as instructed to test blood sugar twice daily   Lancets 28G MISC by Does not apply route daily.   tamsulosin (FLOMAX) 0.4 MG CAPS capsule Take 1 capsule (0.4 mg total) by mouth daily.   terbinafine (LAMISIL) 250 MG tablet Take 1 tablet (250 mg total) by mouth daily.   [DISCONTINUED] DULoxetine (CYMBALTA) 60 MG capsule Take 1 capsule (60 mg total) by mouth daily.   [DISCONTINUED] ezetimibe (ZETIA) 10 MG tablet Take 1 tablet (10 mg total) by mouth daily.   [DISCONTINUED] gabapentin (NEURONTIN) 800 MG tablet Take 1 tablet (800 mg total) by mouth 3 (three) times daily.   [DISCONTINUED] gemfibrozil (LOPID) 600 MG  tablet Take 1 tablet (600 mg total) by mouth 2 (two) times daily before a meal.   [DISCONTINUED] glyBURIDE (DIABETA) 5 MG tablet Take 1 tablet (5 mg total) by mouth daily with supper.   [DISCONTINUED] lisinopril (ZESTRIL) 20 MG tablet Take 1 tablet (20 mg total) by mouth daily. Take one tab po qd   [DISCONTINUED] metoprolol tartrate (LOPRESSOR) 50 MG tablet Take 1 tablet (50 mg total) by mouth 2 (two) times daily.   [DISCONTINUED] omeprazole (PRILOSEC) 20 MG capsule Take 1 capsule (20 mg total) by mouth daily.   [DISCONTINUED] pioglitazone (ACTOS) 15 MG tablet Take 1 tablet (15 mg total) by mouth daily.   DULoxetine (CYMBALTA) 60 MG capsule Take 1 capsule (60 mg total) by mouth daily.   ezetimibe (ZETIA) 10 MG tablet Take 1 tablet (10 mg total) by mouth daily.   gabapentin (NEURONTIN) 800 MG tablet Take 1 tablet (800 mg total) by mouth 3 (three) times daily.   gemfibrozil (LOPID) 600 MG tablet Take 1 tablet (600 mg total) by mouth 2 (two) times daily before a meal.   glyBURIDE (DIABETA) 5 MG tablet Take 1 tablet (5 mg total) by mouth 2 (two) times daily with a meal.   lisinopril (ZESTRIL) 20 MG tablet Take 1 tablet (20 mg total) by mouth daily. Take one tab po qd   metoprolol tartrate (LOPRESSOR) 50 MG tablet Take 1 tablet (50 mg total) by mouth 2 (two)  times daily.   omeprazole (PRILOSEC) 20 MG capsule Take 1 capsule (20 mg total) by mouth daily.   pioglitazone (ACTOS) 15 MG tablet Take 1 tablet (15 mg total) by mouth daily.   No facility-administered encounter medications on file as of 01/21/2021.    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     Family History: Family History  Problem Relation Age of Onset   Hypertension Mother     Social History   Socioeconomic History   Marital status: Married    Spouse name: Not on file   Number of children: Not on file   Years of education: Not on file   Highest education level: Not on file  Occupational History   Not on file  Tobacco Use   Smoking status: Never   Smokeless tobacco: Never  Substance and Sexual Activity   Alcohol use: Yes    Alcohol/week: 1.0 standard drink    Types: 1 Cans of beer per week    Comment: occ   Drug use: No   Sexual activity: Not on file  Other Topics Concern   Not on file  Social History Narrative   Not on file   Social Determinants of Health   Financial Resource Strain: Low Risk    Difficulty of Paying Living Expenses: Not hard at all  Food Insecurity: Not on file  Transportation Needs: Not on file  Physical Activity: Not on file  Stress: Not on file  Social Connections: Not on file  Intimate Partner Violence: Not on file      Review of Systems  Constitutional:  Negative for activity change, appetite change, chills, fatigue, fever and unexpected weight change.  HENT: Negative.  Negative for congestion, ear pain, rhinorrhea, sore throat and trouble swallowing.   Eyes: Negative.   Respiratory: Negative.  Negative for cough, chest tightness, shortness of breath and wheezing.   Cardiovascular: Negative.  Negative for chest pain.  Gastrointestinal: Negative.  Negative for abdominal pain, blood in stool, constipation, diarrhea, nausea and vomiting.  Endocrine: Negative.   Genitourinary: Negative.  Negative for difficulty urinating, dysuria, frequency, hematuria and urgency.  Musculoskeletal: Negative.  Negative for arthralgias, back pain, joint  swelling, myalgias and neck pain.  Skin: Negative.  Negative for rash and wound.  Allergic/Immunologic: Negative.  Negative for immunocompromised state.  Neurological: Negative.  Negative for dizziness, seizures, numbness and headaches.  Hematological: Negative.   Psychiatric/Behavioral: Negative.  Negative for behavioral problems, self-injury and suicidal ideas. The patient is not nervous/anxious.    Vital Signs: BP 139/64   Pulse 76   Temp 98.4 F (36.9 C)   Resp 16   Ht $R'5\' 9"'uK$  (1.753 m)   Wt 189 lb (85.7 kg)   SpO2 98%   BMI 27.91 kg/m    Physical Exam Vitals reviewed.  Constitutional:      General: He is awake. He is not in acute distress.    Appearance: Normal appearance. He  is well-developed, well-groomed and normal weight. He is not ill-appearing or diaphoretic.  HENT:     Head: Normocephalic and atraumatic.     Right Ear: Tympanic membrane, ear canal and external ear normal.     Left Ear: Tympanic membrane, ear canal and external ear normal.     Nose: Nose normal. No congestion or rhinorrhea.     Mouth/Throat:     Lips: Pink.     Mouth: Mucous membranes are moist.     Pharynx: Oropharynx is clear. Uvula midline. No oropharyngeal exudate or posterior oropharyngeal erythema.  Eyes:     General: Lids are normal. Vision grossly intact. Gaze aligned appropriately. No scleral icterus.       Right eye: No discharge.        Left eye: No discharge.     Extraocular Movements: Extraocular movements intact.     Conjunctiva/sclera: Conjunctivae normal.     Pupils: Pupils are equal, round, and reactive to light.     Funduscopic exam:    Right eye: Red reflex present.        Left eye: Red reflex present. Neck:     Thyroid: No thyromegaly.     Vascular: No JVD.     Trachea: Trachea and phonation normal. No tracheal deviation.  Cardiovascular:     Rate and Rhythm: Normal rate and regular rhythm.     Pulses: Normal pulses.     Heart sounds: Normal heart sounds, S1 normal and  S2 normal. No murmur heard.   No friction rub. No gallop.  Pulmonary:     Effort: Pulmonary effort is normal. No accessory muscle usage or respiratory distress.     Breath sounds: Normal breath sounds and air entry. No stridor. No wheezing or rales.  Chest:     Chest wall: No tenderness.  Abdominal:     General: Bowel sounds are normal. There is no distension.     Palpations: Abdomen is soft. There is no shifting dullness, fluid wave, mass or pulsatile mass.     Tenderness: There is no abdominal tenderness. There is no guarding or rebound.  Musculoskeletal:        General: No tenderness or deformity. Normal range of motion.     Cervical back: Normal range of motion and neck supple.     Right lower leg: No edema.     Left lower leg: No edema.  Lymphadenopathy:     Cervical: No cervical adenopathy.  Skin:    General: Skin is warm and dry.     Capillary Refill: Capillary refill takes less than 2 seconds.     Coloration: Skin is not pale.     Findings: No erythema or rash.  Neurological:     Mental Status: He is alert and oriented to person, place, and time.     Cranial Nerves: No cranial nerve deficit.     Motor: No abnormal muscle tone.     Coordination: Coordination normal.     Gait: Gait normal.     Deep Tendon Reflexes: Reflexes are normal and symmetric.  Psychiatric:        Mood and Affect: Mood normal.        Behavior: Behavior normal. Behavior is cooperative.        Thought Content: Thought content normal.        Judgment: Judgment normal.       Assessment/Plan: 1. Encounter for general adult medical examination with abnormal findings Age-appropriate preventive screenings and vaccinations discussed, annual physical  exam completed. Routine labs for health maintenance ordered, see below. PHM updated.   2. Essential hypertension, benign Stable, refills ordered.  Routine lab ordered. - CBC with Differential/Platelet - lisinopril (ZESTRIL) 20 MG tablet; Take 1 tablet (20  mg total) by mouth daily. Take one tab po qd  Dispense: 90 tablet; Refill: 1 - metoprolol tartrate (LOPRESSOR) 50 MG tablet; Take 1 tablet (50 mg total) by mouth 2 (two) times daily.  Dispense: 180 tablet; Refill: 1  3. Type 2 diabetes mellitus with diabetic neuropathy, without long-term current use of insulin (HCC) Stable, refills ordered.  Routine labs ordered. - glyBURIDE (DIABETA) 5 MG tablet; Take 1 tablet (5 mg total) by mouth 2 (two) times daily with a meal.  Dispense: 180 tablet; Refill: 2 - CBC with Differential/Platelet - CMP14+EGFR - DULoxetine (CYMBALTA) 60 MG capsule; Take 1 capsule (60 mg total) by mouth daily.  Dispense: 90 capsule; Refill: 1 - gabapentin (NEURONTIN) 800 MG tablet; Take 1 tablet (800 mg total) by mouth 3 (three) times daily.  Dispense: 270 tablet; Refill: 1 - pioglitazone (ACTOS) 15 MG tablet; Take 1 tablet (15 mg total) by mouth daily.  Dispense: 90 tablet; Refill: 1  4. Mixed hyperlipidemia Stable, refills ordered.  Routine lab ordered. - CBC with Differential/Platelet - Lipid Profile - ezetimibe (ZETIA) 10 MG tablet; Take 1 tablet (10 mg total) by mouth daily.  Dispense: 90 tablet; Refill: 1 - gemfibrozil (LOPID) 600 MG tablet; Take 1 tablet (600 mg total) by mouth 2 (two) times daily before a meal.  Dispense: 180 tablet; Refill: 1  5. Gastroesophageal reflux disease without esophagitis Refills ordered, routine lab ordered. - CBC with Differential/Platelet - omeprazole (PRILOSEC) 20 MG capsule; Take 1 capsule (20 mg total) by mouth daily.  Dispense: 90 capsule; Refill: 1  6. Vitamin D deficiency Routine lab ordered - Vitamin D (25 hydroxy)  7. Dysuria Routine urinalysis done. - UA/M w/rflx Culture, Routine - CBC with Differential/Platelet - Microscopic Examination      General Counseling: konrad hoak understanding of the findings of todays visit and agrees with plan of treatment. I have discussed any further diagnostic evaluation that may  be needed or ordered today. We also reviewed his medications today. he has been encouraged to call the office with any questions or concerns that should arise related to todays visit.    Orders Placed This Encounter  Procedures   Microscopic Examination   UA/M w/rflx Culture, Routine   CBC with Differential/Platelet   CMP14+EGFR   Lipid Profile   Vitamin D (25 hydroxy)    Meds ordered this encounter  Medications   glyBURIDE (DIABETA) 5 MG tablet    Sig: Take 1 tablet (5 mg total) by mouth 2 (two) times daily with a meal.    Dispense:  180 tablet    Refill:  2   ezetimibe (ZETIA) 10 MG tablet    Sig: Take 1 tablet (10 mg total) by mouth daily.    Dispense:  90 tablet    Refill:  1   lisinopril (ZESTRIL) 20 MG tablet    Sig: Take 1 tablet (20 mg total) by mouth daily. Take one tab po qd    Dispense:  90 tablet    Refill:  1   omeprazole (PRILOSEC) 20 MG capsule    Sig: Take 1 capsule (20 mg total) by mouth daily.    Dispense:  90 capsule    Refill:  1   metoprolol tartrate (LOPRESSOR) 50 MG tablet  Sig: Take 1 tablet (50 mg total) by mouth 2 (two) times daily.    Dispense:  180 tablet    Refill:  1   DULoxetine (CYMBALTA) 60 MG capsule    Sig: Take 1 capsule (60 mg total) by mouth daily.    Dispense:  90 capsule    Refill:  1   gemfibrozil (LOPID) 600 MG tablet    Sig: Take 1 tablet (600 mg total) by mouth 2 (two) times daily before a meal.    Dispense:  180 tablet    Refill:  1   gabapentin (NEURONTIN) 800 MG tablet    Sig: Take 1 tablet (800 mg total) by mouth 3 (three) times daily.    Dispense:  270 tablet    Refill:  1   pioglitazone (ACTOS) 15 MG tablet    Sig: Take 1 tablet (15 mg total) by mouth daily.    Dispense:  90 tablet    Refill:  1    Return in about 3 months (around 04/21/2021) for Recheck A1C, Rosalio Catterton PCP.   Total time spent:30 Minutes Time spent includes review of chart, medications, test results, and follow up plan with the patient.   [place]  Controlled Substance Database was reviewed by me.  This patient was seen by Jonetta Osgood, FNP-C in collaboration with Dr. Clayborn Bigness as a part of collaborative care agreement.  Hearl Heikes R. Valetta Fuller, MSN, FNP-C Internal medicine

## 2021-01-22 LAB — UA/M W/RFLX CULTURE, ROUTINE
Bilirubin, UA: NEGATIVE
Glucose, UA: NEGATIVE
Ketones, UA: NEGATIVE
Leukocytes,UA: NEGATIVE
Nitrite, UA: NEGATIVE
Protein,UA: NEGATIVE
RBC, UA: NEGATIVE
Specific Gravity, UA: 1.021 (ref 1.005–1.030)
Urobilinogen, Ur: 0.2 mg/dL (ref 0.2–1.0)
pH, UA: 6 (ref 5.0–7.5)

## 2021-01-22 LAB — MICROSCOPIC EXAMINATION
Bacteria, UA: NONE SEEN
Casts: NONE SEEN /lpf
Epithelial Cells (non renal): NONE SEEN /hpf (ref 0–10)
RBC, Urine: NONE SEEN /hpf (ref 0–2)
WBC, UA: NONE SEEN /hpf (ref 0–5)

## 2021-01-24 DIAGNOSIS — I10 Essential (primary) hypertension: Secondary | ICD-10-CM | POA: Diagnosis not present

## 2021-01-24 DIAGNOSIS — K219 Gastro-esophageal reflux disease without esophagitis: Secondary | ICD-10-CM | POA: Diagnosis not present

## 2021-01-24 DIAGNOSIS — R300 Dysuria: Secondary | ICD-10-CM | POA: Diagnosis not present

## 2021-01-24 DIAGNOSIS — E559 Vitamin D deficiency, unspecified: Secondary | ICD-10-CM | POA: Diagnosis not present

## 2021-01-24 DIAGNOSIS — E782 Mixed hyperlipidemia: Secondary | ICD-10-CM | POA: Diagnosis not present

## 2021-01-24 DIAGNOSIS — E1140 Type 2 diabetes mellitus with diabetic neuropathy, unspecified: Secondary | ICD-10-CM | POA: Diagnosis not present

## 2021-01-25 LAB — LIPID PANEL
Chol/HDL Ratio: 4 ratio (ref 0.0–5.0)
Cholesterol, Total: 144 mg/dL (ref 100–199)
HDL: 36 mg/dL — ABNORMAL LOW (ref >39)
LDL Chol Calc (NIH): 81 mg/dL (ref 0–99)
Triglycerides: 154 mg/dL — ABNORMAL HIGH (ref 0–149)
VLDL Cholesterol Cal: 27 mg/dL (ref 5–40)

## 2021-01-25 LAB — CMP14+EGFR
ALT: 21 IU/L (ref 0–44)
AST: 22 IU/L (ref 0–40)
Albumin/Globulin Ratio: 2 (ref 1.2–2.2)
Albumin: 4.6 g/dL (ref 3.7–4.7)
Alkaline Phosphatase: 55 IU/L (ref 44–121)
BUN/Creatinine Ratio: 21 (ref 10–24)
BUN: 22 mg/dL (ref 8–27)
Bilirubin Total: 0.3 mg/dL (ref 0.0–1.2)
CO2: 24 mmol/L (ref 20–29)
Calcium: 10.3 mg/dL — ABNORMAL HIGH (ref 8.6–10.2)
Chloride: 101 mmol/L (ref 96–106)
Creatinine, Ser: 1.07 mg/dL (ref 0.76–1.27)
Globulin, Total: 2.3 g/dL (ref 1.5–4.5)
Glucose: 104 mg/dL — ABNORMAL HIGH (ref 70–99)
Potassium: 5.3 mmol/L — ABNORMAL HIGH (ref 3.5–5.2)
Sodium: 141 mmol/L (ref 134–144)
Total Protein: 6.9 g/dL (ref 6.0–8.5)
eGFR: 74 mL/min/{1.73_m2} (ref >59)

## 2021-01-25 LAB — CBC WITH DIFFERENTIAL/PLATELET
Basophils Absolute: 0.1 10*3/uL (ref 0.0–0.2)
Basos: 1 %
EOS (ABSOLUTE): 0.3 10*3/uL (ref 0.0–0.4)
Eos: 5 %
Hematocrit: 40.6 % (ref 37.5–51.0)
Hemoglobin: 13.7 g/dL (ref 13.0–17.7)
Immature Grans (Abs): 0 10*3/uL (ref 0.0–0.1)
Immature Granulocytes: 0 %
Lymphocytes Absolute: 2.9 10*3/uL (ref 0.7–3.1)
Lymphs: 41 %
MCH: 29.7 pg (ref 26.6–33.0)
MCHC: 33.7 g/dL (ref 31.5–35.7)
MCV: 88 fL (ref 79–97)
Monocytes Absolute: 0.9 10*3/uL (ref 0.1–0.9)
Monocytes: 13 %
Neutrophils Absolute: 2.7 10*3/uL (ref 1.4–7.0)
Neutrophils: 40 %
Platelets: 342 10*3/uL (ref 150–450)
RBC: 4.62 x10E6/uL (ref 4.14–5.80)
RDW: 12.2 % (ref 11.6–15.4)
WBC: 6.9 10*3/uL (ref 3.4–10.8)

## 2021-01-25 LAB — VITAMIN D 25 HYDROXY (VIT D DEFICIENCY, FRACTURES): Vit D, 25-Hydroxy: 46.7 ng/mL (ref 30.0–100.0)

## 2021-01-28 ENCOUNTER — Telehealth: Payer: Self-pay

## 2021-02-02 ENCOUNTER — Telehealth: Payer: Self-pay

## 2021-02-02 NOTE — Progress Notes (Signed)
Please call patient and let him know about his lab results: -CBC is normal, previous anemia has resolved. -vitamin D level is normal, may take or continue to take OTC supplement if desired -metabolic panel is normal except for slightly elevated potassium and calcium levels. Will repeat levels at next office visit -lipid panel is improving, triglyceride level significantly improved, and HDL level is coming back up.

## 2021-02-02 NOTE — Telephone Encounter (Signed)
-----   Message from Jonetta Osgood, NP sent at 02/02/2021  6:22 AM EST ----- Please call patient and let him know about his lab results: -CBC is normal, previous anemia has resolved. -vitamin D level is normal, may take or continue to take OTC supplement if desired -metabolic panel is normal except for slightly elevated potassium and calcium levels. Will repeat levels at next office visit -lipid panel is improving, triglyceride level significantly improved, and HDL level is coming back up.

## 2021-02-08 DIAGNOSIS — Z96652 Presence of left artificial knee joint: Secondary | ICD-10-CM | POA: Diagnosis not present

## 2021-02-08 DIAGNOSIS — Z96651 Presence of right artificial knee joint: Secondary | ICD-10-CM | POA: Diagnosis not present

## 2021-02-15 ENCOUNTER — Encounter: Payer: Self-pay | Admitting: Nurse Practitioner

## 2021-02-25 ENCOUNTER — Telehealth: Payer: Self-pay

## 2021-04-06 DIAGNOSIS — R202 Paresthesia of skin: Secondary | ICD-10-CM | POA: Diagnosis not present

## 2021-04-06 DIAGNOSIS — R0989 Other specified symptoms and signs involving the circulatory and respiratory systems: Secondary | ICD-10-CM | POA: Diagnosis not present

## 2021-04-06 DIAGNOSIS — R519 Headache, unspecified: Secondary | ICD-10-CM | POA: Diagnosis not present

## 2021-04-06 DIAGNOSIS — E1140 Type 2 diabetes mellitus with diabetic neuropathy, unspecified: Secondary | ICD-10-CM | POA: Diagnosis not present

## 2021-04-06 DIAGNOSIS — R2689 Other abnormalities of gait and mobility: Secondary | ICD-10-CM | POA: Diagnosis not present

## 2021-04-06 DIAGNOSIS — M21372 Foot drop, left foot: Secondary | ICD-10-CM | POA: Diagnosis not present

## 2021-04-06 DIAGNOSIS — G609 Hereditary and idiopathic neuropathy, unspecified: Secondary | ICD-10-CM | POA: Diagnosis not present

## 2021-04-07 DIAGNOSIS — R200 Anesthesia of skin: Secondary | ICD-10-CM | POA: Diagnosis not present

## 2021-04-11 DIAGNOSIS — G5603 Carpal tunnel syndrome, bilateral upper limbs: Secondary | ICD-10-CM | POA: Diagnosis not present

## 2021-04-21 ENCOUNTER — Encounter: Payer: Self-pay | Admitting: Nurse Practitioner

## 2021-04-21 ENCOUNTER — Ambulatory Visit (INDEPENDENT_AMBULATORY_CARE_PROVIDER_SITE_OTHER): Payer: Medicare HMO | Admitting: Nurse Practitioner

## 2021-04-21 ENCOUNTER — Other Ambulatory Visit: Payer: Self-pay

## 2021-04-21 VITALS — BP 124/80 | HR 73 | Temp 98.5°F | Resp 16 | Ht 69.0 in | Wt 186.8 lb

## 2021-04-21 DIAGNOSIS — E1140 Type 2 diabetes mellitus with diabetic neuropathy, unspecified: Secondary | ICD-10-CM | POA: Diagnosis not present

## 2021-04-21 DIAGNOSIS — I10 Essential (primary) hypertension: Secondary | ICD-10-CM

## 2021-04-21 DIAGNOSIS — E782 Mixed hyperlipidemia: Secondary | ICD-10-CM

## 2021-04-21 LAB — POCT GLYCOSYLATED HEMOGLOBIN (HGB A1C): Hemoglobin A1C: 6.4 % — AB (ref 4.0–5.6)

## 2021-04-21 MED ORDER — EMPAGLIFLOZIN 25 MG PO TABS: 25.0000 mg | ORAL_TABLET | Freq: Every day | ORAL | 2 refills | Status: DC

## 2021-04-21 MED ORDER — FREESTYLE LIBRE 2 SENSOR MISC: 1.0000 | 2 refills | Status: DC

## 2021-04-21 NOTE — Progress Notes (Signed)
[facility] [place], [place] [address]  Internal MEDICINE  Office Visit Note  Patient Name: Shawn Greene  [id number]  [id number]  Date of Service: 04/21/2021  Chief Complaint  Patient presents with   Follow-up   Diabetes    Blood sugar is fluctuating too much, pt wants sensor    Hyperlipidemia   Hypertension    HPI Shawn Greene presents for follow-up visit for diabetes and hypertension.  He reports that his blood glucose levels have been fluctuating more than previously and he is requesting the freestyle libre sensor.  He is currently taking glyburide twice a day and Actos daily but he is not taking the Actos consistently.  Patient prefers to stay off of metformin due to negative effects on the kidneys.  Patient is not currently on any insulin or weekly injectable diabetic medications.  He has not tried any other diabetic medications other than metformin.  Today his A1c is 6.4 which is stable and no change from his previous A1c in October 2022.  He reports his glucose levels have fluctuated from 150-160 up to the 250s.  His blood pressure is stable on current medications.    Current Medication: Outpatient Encounter Medications as of 04/21/2021  Medication Sig   aspirin EC 81 MG tablet Take 81 mg by mouth daily. Swallow whole.   blood glucose meter kit and supplies KIT Dispense based on patient and insurance preference. Use twice daily as directed   Continuous Blood Gluc Sensor (FREESTYLE LIBRE 2 SENSOR) MISC 1 Device by Does not apply route every 14 (fourteen) days.   DULoxetine (CYMBALTA) 60 MG capsule Take 1 capsule (60 mg total) by mouth daily.   empagliflozin (JARDIANCE) 25 MG TABS tablet Take 1 tablet (25 mg total) by mouth daily before breakfast.   ezetimibe (ZETIA) 10 MG tablet Take 1 tablet (10 mg total) by mouth daily.   gabapentin (NEURONTIN) 800 MG tablet Take 1 tablet (800 mg total) by mouth 3 (three) times daily.   gemfibrozil (LOPID) 600 MG tablet Take 1  tablet (600 mg total) by mouth 2 (two) times daily before a meal.   glucose blood test strip Use as instructed to test blood sugar twice daily   glyBURIDE (DIABETA) 5 MG tablet Take 1 tablet (5 mg total) by mouth 2 (two) times daily with a meal.   Lancets 28G MISC by Does not apply route daily.   lisinopril (ZESTRIL) 20 MG tablet Take 1 tablet (20 mg total) by mouth daily. Take one tab po qd   metoprolol tartrate (LOPRESSOR) 50 MG tablet Take 1 tablet (50 mg total) by mouth 2 (two) times daily.   omeprazole (PRILOSEC) 20 MG capsule Take 1 capsule (20 mg total) by mouth daily.   pioglitazone (ACTOS) 15 MG tablet Take 1 tablet (15 mg total) by mouth daily.   tamsulosin (FLOMAX) 0.4 MG CAPS capsule Take 1 capsule (0.4 mg total) by mouth daily.   terbinafine (LAMISIL) 250 MG tablet Take 1 tablet (250 mg total) by mouth daily.   No facility-administered encounter medications on file as of 04/21/2021.    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     Family History: Family History  Problem Relation Age of Onset   Hypertension Mother     Social History   Socioeconomic History   Marital status: Married    Spouse name: Not on file   Number of children: Not on file   Years of education: Not on file   Highest education level: Not on file  Occupational History   Not on file  Tobacco Use   Smoking status: Never   Smokeless tobacco: Never  Substance and Sexual  Activity   Alcohol use: Yes    Alcohol/week: 1.0 standard drink    Types: 1 Cans of beer per week    Comment: occ   Drug use: No   Sexual activity: Not on file  Other Topics Concern   Not on file  Social History Narrative   Not on file   Social Determinants of Health   Financial Resource Strain: Low Risk    Difficulty of Paying Living Expenses: Not hard at all  Food Insecurity: Not on file  Transportation Needs: Not on file  Physical Activity: Not on file  Stress: Not on file  Social Connections: Not on file  Intimate Partner Violence: Not on file      Review of Systems  Constitutional:  Negative for chills, fatigue and unexpected weight change.  HENT:  Negative for congestion, rhinorrhea, sneezing and sore throat.   Eyes:  Negative for redness.  Respiratory:  Negative for cough, chest tightness and shortness of breath.   Cardiovascular:  Negative for chest pain and palpitations.  Gastrointestinal:  Negative for abdominal pain, constipation, diarrhea, nausea and vomiting.  Genitourinary:  Negative for dysuria and frequency.  Musculoskeletal:  Negative for arthralgias, back pain, joint swelling and neck pain.  Skin:  Negative for rash.  Neurological: Negative.  Negative for tremors and numbness.  Hematological:  Negative for adenopathy. Does not bruise/bleed easily.  Psychiatric/Behavioral:  Negative for behavioral problems (Depression), sleep disturbance and suicidal ideas. The patient is not nervous/anxious.    Vital Signs: BP 124/80    Pulse 73    Temp 98.5 F (36.9 C)    Resp 16    Ht 5' 9"  (1.753 m)    Wt 186 lb 12.8 oz (84.7 kg)    SpO2 98%    BMI 27.59 kg/m    Physical Exam Vitals reviewed.  Constitutional:      Appearance: Normal appearance.  HENT:     Head: Normocephalic and atraumatic.  Eyes:     Pupils: Pupils are equal, round, and reactive to light.  Cardiovascular:     Rate and Rhythm: Normal rate and regular rhythm.  Pulmonary:     Effort:  Pulmonary effort is normal. No respiratory distress.  Neurological:     Mental Status: He is alert and oriented to person, place, and time.  Psychiatric:        Mood and Affect: Mood normal.        Behavior: Behavior normal.       Assessment/Plan: 1. Type 2 diabetes mellitus with diabetic neuropathy, without long-term current use of insulin (HCC) A1c remains stable at 6.4 but patient reports that his blood sugars have been fluctuating more than usual.  Jardiance added to patient medication regimen for diabetes with a plan to  decrease or eliminate either the glyburide or Actos eventually.  Freestyle libre sensor ordered for the patient.  Explained to patient that his insurance will most likely require prior authorization for the sensor and may not cover it since he is not on any insulin at this time but the prescription was sent anyways to see if it could be approved for him due to his fluctuating glucose levels. - POCT HgB A1C - empagliflozin (JARDIANCE) 25 MG TABS tablet; Take 1 tablet (25 mg total) by mouth daily before breakfast.  Dispense: 30 tablet; Refill: 2 - Continuous Blood Gluc Sensor (FREESTYLE LIBRE 2 SENSOR) MISC; 1 Device by Does not apply route every 14 (fourteen) days.  Dispense: 1 each; Refill: 2  2. Essential hypertension, benign Blood pressures are stable with current medications.  3. Mixed hyperlipidemia Patient did not tolerate statin drugs.  Patient is currently taking ezetimibe and gemfibrozil.  We will repeat lipid panel with his annual wellness visit later this year.   General Counseling: rodolph hagemann understanding of the findings of todays visit and agrees with plan of treatment. I have discussed any further diagnostic evaluation that may be needed or ordered today. We also reviewed his medications today. he has been encouraged to call the office with any questions or concerns that should arise related to todays visit.    Orders Placed This Encounter   Procedures   POCT HgB A1C    Meds ordered this encounter  Medications   empagliflozin (JARDIANCE) 25 MG TABS tablet    Sig: Take 1 tablet (25 mg total) by mouth daily before breakfast.    Dispense:  30 tablet    Refill:  2   Continuous Blood Gluc Sensor (FREESTYLE LIBRE 2 SENSOR) MISC    Sig: 1 Device by Does not apply route every 14 (fourteen) days.    Dispense:  1 each    Refill:  2    E11.65 -- taking jardiance, glyburide and actos.    Return in about 3 months (around 07/22/2021) for F/U, Recheck A1C, Emmitt Matthews PCP.   Total time spent:30 Minutes Time spent includes review of chart, medications, test results, and follow up plan with the patient.   [place] Controlled Substance Database was reviewed by me.  This patient was seen by Jonetta Osgood, FNP-C in collaboration with Dr. Clayborn Bigness as a part of collaborative care agreement.   Jasdeep Dejarnett R. Valetta Fuller, MSN, FNP-C Internal medicine

## 2021-05-08 ENCOUNTER — Telehealth: Payer: Self-pay

## 2021-05-08 NOTE — Telephone Encounter (Signed)
PA sent for FREESTYLE LIBRE 2 SENSOR 05/08/21 @ 424pm ?

## 2021-05-10 DIAGNOSIS — E113393 Type 2 diabetes mellitus with moderate nonproliferative diabetic retinopathy without macular edema, bilateral: Secondary | ICD-10-CM | POA: Diagnosis not present

## 2021-05-16 DIAGNOSIS — H524 Presbyopia: Secondary | ICD-10-CM | POA: Diagnosis not present

## 2021-07-21 ENCOUNTER — Ambulatory Visit (INDEPENDENT_AMBULATORY_CARE_PROVIDER_SITE_OTHER): Payer: Medicare HMO | Admitting: Nurse Practitioner

## 2021-07-21 ENCOUNTER — Encounter: Payer: Self-pay | Admitting: Nurse Practitioner

## 2021-07-21 VITALS — BP 125/68 | HR 76 | Temp 98.0°F | Resp 16 | Ht 69.0 in | Wt 183.0 lb

## 2021-07-21 DIAGNOSIS — E782 Mixed hyperlipidemia: Secondary | ICD-10-CM | POA: Diagnosis not present

## 2021-07-21 DIAGNOSIS — E1140 Type 2 diabetes mellitus with diabetic neuropathy, unspecified: Secondary | ICD-10-CM | POA: Diagnosis not present

## 2021-07-21 DIAGNOSIS — K219 Gastro-esophageal reflux disease without esophagitis: Secondary | ICD-10-CM | POA: Diagnosis not present

## 2021-07-21 DIAGNOSIS — I10 Essential (primary) hypertension: Secondary | ICD-10-CM | POA: Diagnosis not present

## 2021-07-21 LAB — POCT GLYCOSYLATED HEMOGLOBIN (HGB A1C): Hemoglobin A1C: 7.1 % — AB (ref 4.0–5.6)

## 2021-07-21 MED ORDER — GLIPIZIDE ER 10 MG PO TB24: 10.0000 mg | ORAL_TABLET | Freq: Every day | ORAL | 1 refills | Status: DC

## 2021-07-21 MED ORDER — GABAPENTIN 800 MG PO TABS: 800.0000 mg | ORAL_TABLET | Freq: Three times a day (TID) | ORAL | 1 refills | Status: DC

## 2021-07-21 MED ORDER — EMPAGLIFLOZIN 25 MG PO TABS: 25.0000 mg | ORAL_TABLET | Freq: Every day | ORAL | 2 refills | Status: DC

## 2021-07-21 MED ORDER — LISINOPRIL 20 MG PO TABS: 20.0000 mg | ORAL_TABLET | Freq: Every day | ORAL | 1 refills | Status: DC

## 2021-07-21 MED ORDER — EZETIMIBE 10 MG PO TABS: 10.0000 mg | ORAL_TABLET | Freq: Every day | ORAL | 1 refills | Status: DC

## 2021-07-21 MED ORDER — OMEPRAZOLE 20 MG PO CPDR: 20.0000 mg | DELAYED_RELEASE_CAPSULE | Freq: Every day | ORAL | 1 refills | Status: DC

## 2021-07-21 MED ORDER — DULOXETINE HCL 60 MG PO CPEP: 60.0000 mg | ORAL_CAPSULE | Freq: Every day | ORAL | 1 refills | Status: DC

## 2021-07-21 MED ORDER — PIOGLITAZONE HCL 15 MG PO TABS: 15.0000 mg | ORAL_TABLET | Freq: Every day | ORAL | 1 refills | Status: DC

## 2021-07-21 MED ORDER — GEMFIBROZIL 600 MG PO TABS: 600.0000 mg | ORAL_TABLET | Freq: Two times a day (BID) | ORAL | 1 refills | Status: DC

## 2021-07-21 MED ORDER — METOPROLOL TARTRATE 50 MG PO TABS: 50.0000 mg | ORAL_TABLET | Freq: Two times a day (BID) | ORAL | 1 refills | Status: DC

## 2021-07-21 NOTE — Progress Notes (Signed)
[facility] [place], [place] [address]  Internal MEDICINE  Office Visit Note  Patient Name: Shawn Greene  [id number]  [id number]  Date of Service: 07/21/2021  Chief Complaint  Patient presents with   Follow-up   Diabetes   Hypertension   Hyperlipidemia     HPI Shawn Greene presents for follow-up visit for hypertension, diabetes, hyperlipidemia and medication refills. Blood pressure is stable within normal limits on current medication.  His A1c is due to be checked today, is 7.1 which is elevated from 6.4 in march this year.  Glucose ranges 160-190 typically.  His current medications are not adequately controlling his blood sugars optimally. He is also in need of multiple medication refills.    Current Medication: Outpatient Encounter Medications as of 07/21/2021  Medication Sig   aspirin EC 81 MG tablet Take 81 mg by mouth daily. Swallow whole.   blood glucose meter kit and supplies KIT Dispense based on patient and insurance preference. Use twice daily as directed   Continuous Blood Gluc Sensor (FREESTYLE LIBRE 2 SENSOR) MISC 1 Device by Does not apply route every 14 (fourteen) days.   glipiZIDE (GLUCOTROL XL) 10 MG 24 hr tablet Take 1 tablet (10 mg total) by mouth daily with breakfast.   glucose blood test strip Use as instructed to test blood sugar twice daily   Lancets 28G MISC by Does not apply route daily.   tamsulosin (FLOMAX) 0.4 MG CAPS capsule Take 1 capsule (0.4 mg total) by mouth daily.   [DISCONTINUED] DULoxetine (CYMBALTA) 60 MG capsule Take 1 capsule (60 mg total) by mouth daily.   [DISCONTINUED] empagliflozin (JARDIANCE) 25 MG TABS tablet Take 1 tablet (25 mg total) by mouth daily before breakfast.   [DISCONTINUED] ezetimibe (ZETIA) 10 MG tablet Take 1 tablet (10 mg total) by mouth daily.   [DISCONTINUED] gabapentin (NEURONTIN) 800 MG tablet Take 1 tablet (800 mg total) by mouth 3 (three) times daily.   [DISCONTINUED] gemfibrozil (LOPID) 600 MG  tablet Take 1 tablet (600 mg total) by mouth 2 (two) times daily before a meal.   [DISCONTINUED] glyBURIDE (DIABETA) 5 MG tablet Take 1 tablet (5 mg total) by mouth 2 (two) times daily with a meal.   [DISCONTINUED] lisinopril (ZESTRIL) 20 MG tablet Take 1 tablet (20 mg total) by mouth daily. Take one tab po qd   [DISCONTINUED] metoprolol tartrate (LOPRESSOR) 50 MG tablet Take 1 tablet (50 mg total) by mouth 2 (two) times daily.   [DISCONTINUED] omeprazole (PRILOSEC) 20 MG capsule Take 1 capsule (20 mg total) by mouth daily.   [DISCONTINUED] pioglitazone (ACTOS) 15 MG tablet Take 1 tablet (15 mg total) by mouth daily.   [DISCONTINUED] terbinafine (LAMISIL) 250 MG tablet Take 1 tablet (250 mg total) by mouth daily.   DULoxetine (CYMBALTA) 60 MG capsule Take 1 capsule (60 mg total) by mouth daily.   empagliflozin (JARDIANCE) 25 MG TABS tablet Take 1 tablet (25 mg total) by mouth daily before breakfast.   ezetimibe (ZETIA) 10 MG tablet Take 1 tablet (10 mg total) by mouth daily.   gabapentin (NEURONTIN) 800 MG tablet Take 1 tablet (800 mg total) by mouth 3 (three) times daily.   gemfibrozil (LOPID) 600 MG tablet Take 1 tablet (600 mg total) by mouth 2 (two) times daily before a meal.   lisinopril (ZESTRIL) 20 MG tablet Take 1 tablet (20 mg total) by mouth daily. Take one tab po qd   metoprolol tartrate (LOPRESSOR) 50 MG tablet Take 1 tablet (50  mg total) by mouth 2 (two) times daily.   omeprazole (PRILOSEC) 20 MG capsule Take 1 capsule (20 mg total) by mouth daily.   pioglitazone (ACTOS) 15 MG tablet Take 1 tablet (15 mg total) by mouth daily.   No facility-administered encounter medications on file as of 07/21/2021.    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     Family History: Family History  Problem Relation Age of Onset   Hypertension Mother     Social History   Socioeconomic History   Marital status: Married    Spouse name: Not on file   Number of children: Not on file   Years of education: Not on file   Highest education level: Not on file  Occupational History   Not on file  Tobacco Use   Smoking status: Never   Smokeless tobacco: Never  Substance and Sexual Activity   Alcohol use: Yes    Alcohol/week: 1.0 standard drink of alcohol    Types: 1 Cans of beer per week    Comment: occ   Drug use: No   Sexual activity: Not on file  Other Topics Concern   Not on file  Social History Narrative   Not on file   Social Determinants of Health   Financial Resource Strain: Low Risk  (05/19/2020)   Overall Financial Resource Strain (CARDIA)    Difficulty of Paying Living Expenses: Not hard at all  Food Insecurity: Not on file  Transportation Needs: Not on file  Physical Activity: Not on file  Stress: Not on file  Social Connections: Not on file  Intimate Partner Violence: Not on file      Review of Systems  Constitutional:  Negative for chills, fatigue and unexpected weight change.  HENT:  Negative for congestion, rhinorrhea, sneezing and sore throat.   Eyes:  Negative for redness.  Respiratory:  Negative for cough, chest tightness and shortness of breath.   Cardiovascular:  Negative for chest pain and palpitations.  Gastrointestinal:  Negative for abdominal pain, constipation, diarrhea, nausea and vomiting.  Genitourinary:  Negative for dysuria and frequency.  Musculoskeletal:  Negative for arthralgias, back pain, joint swelling and  neck pain.  Skin:  Negative for rash.  Neurological: Negative.  Negative for tremors and numbness.  Hematological:  Negative for adenopathy. Does not bruise/bleed easily.  Psychiatric/Behavioral:  Negative for behavioral problems (Depression), sleep disturbance and suicidal ideas. The patient is not nervous/anxious.     Vital Signs: BP 125/68   Pulse 76   Temp 98 F (36.7 C)   Resp 16   Ht 5' 9"  (1.753 m)   Wt 183 lb (83 kg)   SpO2 96%   BMI 27.02 kg/m    Physical Exam Vitals reviewed.  Constitutional:      General: He is not in acute distress.    Appearance: Normal appearance. He is not ill-appearing.  HENT:     Head: Normocephalic and atraumatic.  Eyes:     Pupils: Pupils are equal, round, and reactive to light.  Cardiovascular:     Rate and Rhythm: Normal rate and regular rhythm.  Pulmonary:     Effort: Pulmonary effort is normal. No respiratory distress.  Neurological:     Mental Status: He is alert and oriented to person, place, and time.  Psychiatric:        Mood and Affect: Mood normal.        Behavior: Behavior normal.        Assessment/Plan: 1. Type 2 diabetes mellitus with diabetic neuropathy, without long-term current use of insulin (HCC) A1c is elevated at 7.1, extended release glipizide added to medication regimen, continue other medications as prescribed, encouraged patient to continue a diabetic, low carb, low sugar diet.  - POCT HgB A1C - glipiZIDE (GLUCOTROL XL) 10 MG 24 hr tablet; Take 1 tablet (10 mg total) by mouth daily with breakfast.  Dispense: 90 tablet; Refill: 1 - gabapentin (NEURONTIN) 800 MG tablet; Take 1 tablet (800 mg total) by mouth 3 (three) times daily.  Dispense: 270 tablet; Refill: 1 - DULoxetine (CYMBALTA) 60 MG capsule; Take 1 capsule (60 mg total) by mouth daily.  Dispense: 90 capsule; Refill: 1 - empagliflozin (JARDIANCE) 25 MG TABS tablet; Take 1 tablet (25 mg total) by mouth daily before breakfast.  Dispense: 90 tablet;  Refill: 2 - pioglitazone (ACTOS) 15 MG tablet; Take 1 tablet (15 mg total) by mouth daily.  Dispense: 90 tablet; Refill: 1  2. Essential hypertension, benign Blood pressure is stable and controlled with current medications, refills ordered.  - metoprolol tartrate (LOPRESSOR) 50 MG tablet; Take 1 tablet (50 mg total) by mouth 2 (two) times daily.  Dispense: 180 tablet; Refill: 1 - lisinopril (ZESTRIL) 20 MG tablet; Take 1 tablet (20 mg total) by mouth daily. Take one tab po qd  Dispense: 90 tablet; Refill: 1  3. Mixed hyperlipidemia Continue medications as prescribed. Refills ordered.  - gemfibrozil (LOPID) 600 MG tablet; Take 1 tablet (600 mg total) by mouth 2 (two) times daily before a meal.  Dispense: 180 tablet; Refill: 1 - ezetimibe (ZETIA) 10 MG tablet; Take 1 tablet (10 mg total) by mouth daily.  Dispense: 90 tablet; Refill: 1  4. Gastroesophageal reflux disease without esophagitis Stable, refills ordered.  - omeprazole (PRILOSEC) 20 MG capsule; Take 1 capsule (20 mg total) by mouth daily.  Dispense: 90 capsule; Refill: 1   General Counseling: plez belton understanding of the findings of todays visit and agrees with plan of treatment. I have discussed any further diagnostic evaluation that may be needed or ordered today. We also reviewed his medications today. he has been encouraged to call the office with any questions or concerns that should arise related to todays visit.    Orders Placed This Encounter  Procedures   POCT HgB A1C    Meds ordered this encounter  Medications   glipiZIDE (GLUCOTROL XL) 10 MG 24 hr tablet    Sig: Take 1 tablet (10 mg total) by mouth daily with breakfast.    Dispense:  90 tablet    Refill:  1    Discontinue glyburide, start glipizide tomorrow   gabapentin (NEURONTIN) 800 MG tablet    Sig: Take 1 tablet (800 mg total) by mouth 3 (three) times daily.    Dispense:  270 tablet    Refill:  1   gemfibrozil (LOPID) 600 MG tablet    Sig: Take 1  tablet (600 mg total) by mouth  2 (two) times daily before a meal.    Dispense:  180 tablet    Refill:  1   DULoxetine (CYMBALTA) 60 MG capsule    Sig: Take 1 capsule (60 mg total) by mouth daily.    Dispense:  90 capsule    Refill:  1   metoprolol tartrate (LOPRESSOR) 50 MG tablet    Sig: Take 1 tablet (50 mg total) by mouth 2 (two) times daily.    Dispense:  180 tablet    Refill:  1   empagliflozin (JARDIANCE) 25 MG TABS tablet    Sig: Take 1 tablet (25 mg total) by mouth daily before breakfast.    Dispense:  90 tablet    Refill:  2    For future refills   omeprazole (PRILOSEC) 20 MG capsule    Sig: Take 1 capsule (20 mg total) by mouth daily.    Dispense:  90 capsule    Refill:  1   lisinopril (ZESTRIL) 20 MG tablet    Sig: Take 1 tablet (20 mg total) by mouth daily. Take one tab po qd    Dispense:  90 tablet    Refill:  1   pioglitazone (ACTOS) 15 MG tablet    Sig: Take 1 tablet (15 mg total) by mouth daily.    Dispense:  90 tablet    Refill:  1   ezetimibe (ZETIA) 10 MG tablet    Sig: Take 1 tablet (10 mg total) by mouth daily.    Dispense:  90 tablet    Refill:  1    Return in about 3 months (around 10/21/2021) for F/U, Recheck A1C, Kataleah Bejar PCP.   Total time spent:30 Minutes Time spent includes review of chart, medications, test results, and follow up plan with the patient.   [place] Controlled Substance Database was reviewed by me.  This patient was seen by Jonetta Osgood, FNP-C in collaboration with Dr. Clayborn Bigness as a part of collaborative care agreement.   Tyliah Schlereth R. Valetta Fuller, MSN, FNP-C Internal medicine

## 2021-08-03 ENCOUNTER — Telehealth: Payer: Medicare HMO

## 2021-10-06 DIAGNOSIS — E113393 Type 2 diabetes mellitus with moderate nonproliferative diabetic retinopathy without macular edema, bilateral: Secondary | ICD-10-CM | POA: Diagnosis not present

## 2021-10-20 ENCOUNTER — Encounter: Payer: Self-pay | Admitting: Nurse Practitioner

## 2021-10-20 ENCOUNTER — Ambulatory Visit (INDEPENDENT_AMBULATORY_CARE_PROVIDER_SITE_OTHER): Payer: Medicare HMO | Admitting: Nurse Practitioner

## 2021-10-20 VITALS — BP 127/76 | HR 68 | Temp 98.2°F | Resp 16 | Ht 69.0 in | Wt 181.8 lb

## 2021-10-20 DIAGNOSIS — Z0000 Encounter for general adult medical examination without abnormal findings: Secondary | ICD-10-CM | POA: Diagnosis not present

## 2021-10-20 DIAGNOSIS — I10 Essential (primary) hypertension: Secondary | ICD-10-CM

## 2021-10-20 DIAGNOSIS — E782 Mixed hyperlipidemia: Secondary | ICD-10-CM | POA: Diagnosis not present

## 2021-10-20 DIAGNOSIS — E1140 Type 2 diabetes mellitus with diabetic neuropathy, unspecified: Secondary | ICD-10-CM

## 2021-10-20 LAB — POCT GLYCOSYLATED HEMOGLOBIN (HGB A1C): Hemoglobin A1C: 6.2 % — AB (ref 4.0–5.6)

## 2021-10-20 NOTE — Progress Notes (Signed)
[facility] [place], [place] [address]  Internal MEDICINE  Office Visit Note  Patient Name: Shawn Greene  [id number]  [id number]  Date of Service: 10/20/2021  Chief Complaint  Patient presents with   Follow-up   Diabetes   Hyperlipidemia   Hypertension    HPI Paris presents for a follow up visit for diabetes, hypertension and high cholesterol. Diabetes -- A1c is 6.2 today, improved from 7.1. lost 2 lbs. Takes medications as prescribed.  BP controlled with current medications High cholesterol -- takes zetia and gemfibrozil.      Current Medication: Outpatient Encounter Medications as of 10/20/2021  Medication Sig   aspirin EC 81 MG tablet Take 81 mg by mouth daily. Swallow whole.   blood glucose meter kit and supplies KIT Dispense based on patient and insurance preference. Use twice daily as directed   Continuous Blood Gluc Sensor (FREESTYLE LIBRE 2 SENSOR) MISC 1 Device by Does not apply route every 14 (fourteen) days.   DULoxetine (CYMBALTA) 60 MG capsule Take 1 capsule (60 mg total) by mouth daily.   empagliflozin (JARDIANCE) 25 MG TABS tablet Take 1 tablet (25 mg total) by mouth daily before breakfast.   ezetimibe (ZETIA) 10 MG tablet Take 1 tablet (10 mg total) by mouth daily.   gabapentin (NEURONTIN) 800 MG tablet Take 1 tablet (800 mg total) by mouth 3 (three) times daily.   gemfibrozil (LOPID) 600 MG tablet Take 1 tablet (600 mg total) by mouth 2 (two) times daily before a meal.   glipiZIDE (GLUCOTROL XL) 10 MG 24 hr tablet Take 1 tablet (10 mg total) by mouth daily with breakfast.   glucose blood test strip Use as instructed to test blood sugar twice daily   Lancets 28G MISC by Does not apply route daily.   lisinopril (ZESTRIL) 20 MG tablet Take 1 tablet (20 mg total) by mouth daily. Take one tab po qd   metoprolol tartrate (LOPRESSOR) 50 MG tablet Take 1 tablet (50 mg total) by mouth 2 (two) times daily.   omeprazole (PRILOSEC) 20 MG capsule  Take 1 capsule (20 mg total) by mouth daily.   pioglitazone (ACTOS) 15 MG tablet Take 1 tablet (15 mg total) by mouth daily.   tamsulosin (FLOMAX) 0.4 MG CAPS capsule Take 1 capsule (0.4 mg total) by mouth daily.   No facility-administered encounter medications on file as of 10/20/2021.    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     Family History: Family History  Problem Relation Age of Onset   Hypertension Mother     Social History   Socioeconomic History   Marital status: Married    Spouse name: Not on file   Number of children: Not on file   Years of education: Not on file   Highest education level: Not on file  Occupational History   Not on file  Tobacco Use   Smoking status: Never   Smokeless  tobacco: Never  Substance and Sexual Activity   Alcohol use: Yes    Alcohol/week: 1.0 standard drink of alcohol    Types: 1 Cans of beer per week    Comment: occ   Drug use: No   Sexual activity: Not on file  Other Topics Concern   Not on file  Social History Narrative   Not on file   Social Determinants of Health   Financial Resource Strain: Low Risk  (05/19/2020)   Overall Financial Resource Strain (CARDIA)    Difficulty of Paying Living Expenses: Not hard at all  Food Insecurity: Not on file  Transportation Needs: Not on file  Physical Activity: Not on file  Stress: Not on file  Social Connections: Not on file  Intimate Partner Violence:  Not on file      Review of Systems  Constitutional:  Negative for chills, fatigue and unexpected weight change.  HENT:  Negative for congestion, rhinorrhea, sneezing and sore throat.   Eyes:  Negative for redness.  Respiratory:  Negative for cough, chest tightness and shortness of breath.   Cardiovascular:  Negative for chest pain and palpitations.  Gastrointestinal:  Negative for abdominal pain, constipation, diarrhea, nausea and vomiting.  Genitourinary:  Negative for dysuria and frequency.  Musculoskeletal:  Negative for arthralgias, back pain, joint swelling and neck pain.  Skin:  Negative for rash.  Neurological: Negative.  Negative for tremors and numbness.  Hematological:  Negative for adenopathy. Does not bruise/bleed easily.  Psychiatric/Behavioral:  Negative for behavioral problems (Depression), sleep disturbance and suicidal ideas. The patient is not nervous/anxious.     Vital Signs: BP 127/76   Pulse 68   Temp 98.2 F (36.8 C)   Resp 16   Ht _0  (1.753 m)   Wt 181 lb 12.8 oz (82.5 kg)   SpO2 97%   BMI 26.85 kg/m    Physical Exam Vitals reviewed.  Constitutional:      General: He is not in acute distress.    Appearance: Normal appearance. He is not ill-appearing.  HENT:     Head: Normocephalic and atraumatic.  Eyes:     Pupils: Pupils are equal, round, and reactive to light.  Cardiovascular:     Rate and Rhythm: Normal rate and regular rhythm.  Pulmonary:     Effort: Pulmonary effort is normal. No respiratory distress.  Neurological:     Mental Status: He is alert and oriented to person, place, and time.  Psychiatric:        Mood and Affect: Mood normal.        Behavior: Behavior normal.        Assessment/Plan: 1. Type 2 diabetes mellitus with diabetic neuropathy, without long-term current use of insulin (HCC) A1C significantly improved. Routine labs ordered for annual wellness visit in 3 months. Continue current medications as prescribed -  POCT HgB A1C - CBC with Differential/Platelet - CMP14+EGFR - Hgb A1C w/o eAG - Lipid Profile  2. Essential hypertension, benign BP well controlled with current medications, continue as prescribed, routine labs orderd - CBC with Differential/Platelet - CMP14+EGFR - Lipid Profile  3. Mixed hyperlipidemia Routine labs ordered, continue medications as prescribed.  - CBC with Differential/Platelet - Lipid Profile  4. Routine health maintenance Upcoming annual wellness visit in 3 months, routine labs ordered.  - CBC with Differential/Platelet - CMP14+EGFR - Hgb A1C w/o eAG - Lipid Profile   General Counseling: Sylvan verbalizes understanding of the findings of todays visit and agrees with plan of treatment.  I have discussed any further diagnostic evaluation that may be needed or ordered today. We also reviewed his medications today. he has been encouraged to call the office with any questions or concerns that should arise related to todays visit.    Orders Placed This Encounter  Procedures   CBC with Differential/Platelet   CMP14+EGFR   Hgb A1C w/o eAG   Lipid Profile   POCT HgB A1C    No orders of the defined types were placed in this encounter.   Return in 3 months (on 01/24/2022) for previously scheduled, CPE, Recheck A1C, Korrine Sicard PCP.   Total time spent:30 Minutes Time spent includes review of chart, medications, test results, and follow up plan with the patient.   [place] Controlled Substance Database was reviewed by me.  This patient was seen by Jonetta Osgood, FNP-C in collaboration with Dr. Clayborn Bigness as a part of collaborative care agreement.   Lachrista Heslin R. Valetta Fuller, MSN, FNP-C Internal medicine

## 2021-10-25 ENCOUNTER — Telehealth: Payer: Self-pay

## 2021-10-25 DIAGNOSIS — E113393 Type 2 diabetes mellitus with moderate nonproliferative diabetic retinopathy without macular edema, bilateral: Secondary | ICD-10-CM | POA: Diagnosis not present

## 2021-11-07 NOTE — Telephone Encounter (Signed)
As per alyssa hold jardince for know

## 2021-11-07 NOTE — Telephone Encounter (Signed)
Pt notified hold jardiance for now alyssa will discuss at next visit

## 2021-11-09 ENCOUNTER — Ambulatory Visit: Payer: PPO | Admitting: Urology

## 2021-12-20 ENCOUNTER — Encounter: Payer: Self-pay | Admitting: Nurse Practitioner

## 2021-12-30 ENCOUNTER — Other Ambulatory Visit: Payer: Self-pay | Admitting: Urology

## 2022-01-02 DIAGNOSIS — Z0000 Encounter for general adult medical examination without abnormal findings: Secondary | ICD-10-CM | POA: Diagnosis not present

## 2022-01-02 DIAGNOSIS — E1140 Type 2 diabetes mellitus with diabetic neuropathy, unspecified: Secondary | ICD-10-CM | POA: Diagnosis not present

## 2022-01-02 DIAGNOSIS — E782 Mixed hyperlipidemia: Secondary | ICD-10-CM | POA: Diagnosis not present

## 2022-01-03 LAB — CMP14+EGFR
ALT: 29 IU/L (ref 0–44)
AST: 32 IU/L (ref 0–40)
Albumin/Globulin Ratio: 2.1 (ref 1.2–2.2)
Albumin: 4.8 g/dL (ref 3.8–4.8)
Alkaline Phosphatase: 55 IU/L (ref 44–121)
BUN/Creatinine Ratio: 22 (ref 10–24)
BUN: 21 mg/dL (ref 8–27)
Bilirubin Total: 0.4 mg/dL (ref 0.0–1.2)
CO2: 21 mmol/L (ref 20–29)
Calcium: 9.8 mg/dL (ref 8.6–10.2)
Chloride: 102 mmol/L (ref 96–106)
Creatinine, Ser: 0.96 mg/dL (ref 0.76–1.27)
Globulin, Total: 2.3 g/dL (ref 1.5–4.5)
Glucose: 108 mg/dL — ABNORMAL HIGH (ref 70–99)
Potassium: 4.8 mmol/L (ref 3.5–5.2)
Sodium: 138 mmol/L (ref 134–144)
Total Protein: 7.1 g/dL (ref 6.0–8.5)
eGFR: 84 mL/min/{1.73_m2} (ref >59)

## 2022-01-03 LAB — CBC WITH DIFFERENTIAL/PLATELET
Basophils Absolute: 0.1 10*3/uL (ref 0.0–0.2)
Basos: 2 %
EOS (ABSOLUTE): 0.3 10*3/uL (ref 0.0–0.4)
Eos: 6 %
Hematocrit: 40.5 % (ref 37.5–51.0)
Hemoglobin: 14.3 g/dL (ref 13.0–17.7)
Immature Grans (Abs): 0 10*3/uL (ref 0.0–0.1)
Immature Granulocytes: 0 %
Lymphocytes Absolute: 1.8 10*3/uL (ref 0.7–3.1)
Lymphs: 36 %
MCH: 30.4 pg (ref 26.6–33.0)
MCHC: 35.3 g/dL (ref 31.5–35.7)
MCV: 86 fL (ref 79–97)
Monocytes Absolute: 0.6 10*3/uL (ref 0.1–0.9)
Monocytes: 12 %
Neutrophils Absolute: 2.2 10*3/uL (ref 1.4–7.0)
Neutrophils: 44 %
Platelets: 275 10*3/uL (ref 150–450)
RBC: 4.7 x10E6/uL (ref 4.14–5.80)
RDW: 11.9 % (ref 11.6–15.4)
WBC: 5.1 10*3/uL (ref 3.4–10.8)

## 2022-01-03 LAB — LIPID PANEL
Chol/HDL Ratio: 3.8 ratio (ref 0.0–5.0)
Cholesterol, Total: 146 mg/dL (ref 100–199)
HDL: 38 mg/dL — ABNORMAL LOW (ref >39)
LDL Chol Calc (NIH): 85 mg/dL (ref 0–99)
Triglycerides: 128 mg/dL (ref 0–149)
VLDL Cholesterol Cal: 23 mg/dL (ref 5–40)

## 2022-01-03 LAB — HGB A1C W/O EAG: Hgb A1c MFr Bld: 6 % — ABNORMAL HIGH (ref 4.8–5.6)

## 2022-01-16 ENCOUNTER — Ambulatory Visit: Payer: Medicare HMO | Admitting: Nurse Practitioner

## 2022-01-16 ENCOUNTER — Other Ambulatory Visit: Payer: Self-pay | Admitting: Urology

## 2022-01-16 ENCOUNTER — Ambulatory Visit: Payer: Medicare HMO | Admitting: Urology

## 2022-01-16 ENCOUNTER — Encounter: Payer: Self-pay | Admitting: Urology

## 2022-01-16 VITALS — BP 132/71 | HR 87 | Ht 69.0 in | Wt 189.0 lb

## 2022-01-16 DIAGNOSIS — N401 Enlarged prostate with lower urinary tract symptoms: Secondary | ICD-10-CM | POA: Diagnosis not present

## 2022-01-16 DIAGNOSIS — N529 Male erectile dysfunction, unspecified: Secondary | ICD-10-CM

## 2022-01-16 MED ORDER — TAMSULOSIN HCL 0.4 MG PO CAPS: 0.4000 mg | ORAL_CAPSULE | Freq: Every day | ORAL | 3 refills | Status: DC

## 2022-01-16 MED ORDER — FINASTERIDE 5 MG PO TABS: 5.0000 mg | ORAL_TABLET | Freq: Every day | ORAL | 3 refills | Status: DC

## 2022-01-16 NOTE — Progress Notes (Signed)
01/16/2022 2:11 PM   Shawn Greene 28-Sep-1950 [id number]  Referring provider: Jonetta Osgood, NP [address] [place],  [place] [address]  Chief Complaint  Patient presents with   Erectile Dysfunction    Urologic history: 1.  BPH with LUTS Tamsulosin 0.4 mg daily  2.  Erectile dysfunction PDE 5 inhibitor refractory Declined second line options; ICI/VED   HPI: 71 y.o. male presents for annual follow-up.  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 If you have any questions, ask your nurse or doctor.          STOP taking these medications    empagliflozin 25 MG Tabs tablet Commonly known as: JARDIANCE Stopped by: Abbie Sons, MD       TAKE these medications    aspirin EC 81 MG tablet Take 81 mg by mouth daily. Swallow whole.   blood glucose meter kit and supplies Kit Dispense based on patient and insurance preference. Use twice daily as directed   DULoxetine 60 MG capsule Commonly known as: CYMBALTA Take 1 capsule (60 mg total) by mouth daily.   ezetimibe 10 MG tablet Commonly known as:  ZETIA Take 1 tablet (10 mg total) by mouth daily.   finasteride 5 MG tablet Commonly known as: PROSCAR Take 1 tablet (5 mg total) by mouth daily. Started by: Abbie Sons, MD   FreeStyle Libre 2 Sensor Misc 1 Device by Does not apply route every 14 (fourteen) days.   gabapentin 800 MG tablet Commonly known as: NEURONTIN Take 1 tablet (800 mg total) by mouth 3 (three) times daily.   gemfibrozil 600 MG tablet Commonly known as: LOPID Take 1 tablet (600 mg total) by mouth 2 (two) times daily before a meal.   glipiZIDE 10 MG 24 hr tablet Commonly known as: GLUCOTROL XL Take 1 tablet (10 mg total) by mouth daily with breakfast.   glucose blood test strip Use as instructed to test blood sugar twice daily   Lancets 28G Misc by Does not apply route daily.   lisinopril 20 MG tablet Commonly known as: ZESTRIL Take 1 tablet (20 mg total) by mouth daily. Take one tab po qd   metoprolol tartrate 50 MG tablet Commonly known as: LOPRESSOR Take 1 tablet (50 mg total) by mouth 2 (two) times daily.   omeprazole 20 MG capsule Commonly known as: PRILOSEC Take 1 capsule (20 mg total) by mouth daily.   pioglitazone 15 MG tablet Commonly known as: ACTOS Take 1 tablet (15 mg total) by mouth  daily.   tamsulosin 0.4 MG Caps capsule Commonly known as: FLOMAX Take 1 capsule (0.4 mg total) by mouth daily.        Allergies:  Allergies  Allergen Reactions   Pollinex-T [Modified Tree Tyrosine Adsorbate]     Family History: Family History  Problem Relation Age of Onset   Hypertension Mother     Social History:  reports that he has never smoked. He has never used smokeless tobacco. He reports current alcohol use of about 1.0 standard drink of alcohol per week. He reports that he does not use drugs.   Physical Exam: BP 132/71   Pulse 87   Ht _0  (1.753 m)   Wt 189 lb (85.7 kg)   BMI 27.91 kg/m   Constitutional:  Alert and oriented, No acute distress. HEENT: [place]  AT Respiratory: Normal respiratory effort, no increased work of breathing. GI: Abdomen is soft, nontender, nondistended, no abdominal masses Psychiatric: Normal mood and affect.   Assessment & Plan:    1.  BPH with LUTS Stable on tamsulosin Refill sent to pharmacy  Follow-up 1 year  2.  Erectile dysfunction PDE 5 inhibitor refractory    Abbie Sons, MD  [facility] [address] [place], [place] [address] [phone]

## 2022-01-23 ENCOUNTER — Other Ambulatory Visit: Payer: Self-pay | Admitting: Nurse Practitioner

## 2022-01-23 DIAGNOSIS — E1140 Type 2 diabetes mellitus with diabetic neuropathy, unspecified: Secondary | ICD-10-CM

## 2022-01-24 ENCOUNTER — Ambulatory Visit: Payer: Medicare HMO | Admitting: Nurse Practitioner

## 2022-01-26 ENCOUNTER — Telehealth: Payer: Self-pay | Admitting: Nurse Practitioner

## 2022-01-26 NOTE — Telephone Encounter (Signed)
Left vm and sent mychart message to confirm 02/02/22 appointment-Shawn Greene

## 2022-02-01 ENCOUNTER — Encounter: Payer: Self-pay | Admitting: Pharmacist

## 2022-02-01 NOTE — Progress Notes (Signed)
[facility])                                            [place] Team                                        Statin Quality Measure Assessment    02/01/2022  Shawn Greene 1950-09-09 [id number]   I am a [facility] clinical pharmacist that reviews patients for statin quality initiatives.     Per review of chart and payor information, Shawn Greene has a diagnosis of diabetes but is not currently filling a statin prescription.  This places patient into the SUPD (Statin Use In Patients with Diabetes) measure for CMS.    Patient has documented trials of statins with reported side effects, but no corresponding CPT codes that would exclude patient from SUPD measure. In the past he was coded G72.0.  Please consider evaluating Mr. Santoyo for statin therapy or code for past statin intolerance at tomorrow's office visit if clinically appropriate.  His calculated 10-year ASCVD risk score (Arnett DK, et al., 2019) is: 41.5%.    Please consider ONE of the following recommendations:  Code for past statin intolerance or  other exclusions (required annually)  Provider Requirements: Associate code during an office visit or telehealth encounter  Drug Induced Myopathy G72.0   Myopathy, unspecified G72.9   Myositis, unspecified M60.9   Rhabdomyolysis O70.78   Alcoholic fatty liver M75.4   Cirrhosis of liver K74.69   Prediabetes R73.03   Thank you for your time and consideration. Curlene Labrum, PharmD [facility] Pharmacist Office: [phone]

## 2022-02-02 ENCOUNTER — Ambulatory Visit (INDEPENDENT_AMBULATORY_CARE_PROVIDER_SITE_OTHER): Payer: Medicare HMO | Admitting: Nurse Practitioner

## 2022-02-02 ENCOUNTER — Encounter: Payer: Self-pay | Admitting: Nurse Practitioner

## 2022-02-02 VITALS — BP 125/61 | HR 85 | Temp 98.5°F | Resp 16 | Ht 69.0 in | Wt 187.4 lb

## 2022-02-02 DIAGNOSIS — I10 Essential (primary) hypertension: Secondary | ICD-10-CM | POA: Diagnosis not present

## 2022-02-02 DIAGNOSIS — Z0001 Encounter for general adult medical examination with abnormal findings: Secondary | ICD-10-CM | POA: Diagnosis not present

## 2022-02-02 DIAGNOSIS — E1140 Type 2 diabetes mellitus with diabetic neuropathy, unspecified: Secondary | ICD-10-CM

## 2022-02-02 DIAGNOSIS — R300 Dysuria: Secondary | ICD-10-CM | POA: Diagnosis not present

## 2022-02-02 DIAGNOSIS — Z760 Encounter for issue of repeat prescription: Secondary | ICD-10-CM | POA: Diagnosis not present

## 2022-02-02 MED ORDER — LISINOPRIL 20 MG PO TABS: 20.0000 mg | ORAL_TABLET | Freq: Every day | ORAL | 1 refills | Status: DC

## 2022-02-02 MED ORDER — DULOXETINE HCL 60 MG PO CPEP: 60.0000 mg | ORAL_CAPSULE | Freq: Every day | ORAL | 1 refills | Status: DC

## 2022-02-02 MED ORDER — GEMFIBROZIL 600 MG PO TABS: 600.0000 mg | ORAL_TABLET | Freq: Two times a day (BID) | ORAL | 1 refills | Status: DC

## 2022-02-02 MED ORDER — METOPROLOL TARTRATE 50 MG PO TABS: 50.0000 mg | ORAL_TABLET | Freq: Two times a day (BID) | ORAL | 1 refills | Status: DC

## 2022-02-02 MED ORDER — EZETIMIBE 10 MG PO TABS: 10.0000 mg | ORAL_TABLET | Freq: Every day | ORAL | 1 refills | Status: DC

## 2022-02-02 MED ORDER — OMEPRAZOLE 20 MG PO CPDR: 20.0000 mg | DELAYED_RELEASE_CAPSULE | Freq: Every day | ORAL | 1 refills | Status: DC

## 2022-02-02 MED ORDER — PIOGLITAZONE HCL 15 MG PO TABS: 15.0000 mg | ORAL_TABLET | Freq: Every day | ORAL | 1 refills | Status: DC

## 2022-02-02 MED ORDER — GABAPENTIN 800 MG PO TABS: 800.0000 mg | ORAL_TABLET | Freq: Three times a day (TID) | ORAL | 1 refills | Status: DC

## 2022-02-02 NOTE — Progress Notes (Addendum)
[facility] [place], [place] [address]  Internal MEDICINE  Office Visit Note  Patient Name: Shawn Greene  [id number]  [id number]  Date of Service: 02/02/2022  Chief Complaint  Patient presents with   Medicare Wellness   Diabetes   Hyperlipidemia   Hypertension    HPI Shawn Greene presents for an annual well visit and physical exam.  Well-appearing 71 y.o. male with hypertension, diabetes, and high cholesterol Routine CRC screening: done on 01/12/21, no need to repeat per GI Eye exam: march 2023  foot exam: due today -- done Labs: drawn in November and discussed at his previous office visit on 11/27 New or worsening pain: none Other concerns: none      02/02/2022    9:49 AM 01/21/2021    9:57 AM 01/07/2019   11:59 AM  MMSE - Mini Mental State Exam  Orientation to time _0 Orientation to Place _1 Registration _2 Attention/ Calculation _3 Recall _4 Language- name 2 objects _5 Language- repeat _6 Language- follow 3 step command _7 Language- read & follow direction _8 Write a sentence _9 Copy design _10 Total score _11 Functional Status Survey: Is the patient deaf or have difficulty hearing?: No Does the patient have difficulty seeing, even when wearing glasses/contacts?: No Does the patient have difficulty concentrating, remembering, or making decisions?: No Does the patient have difficulty walking or climbing stairs?: Yes Does the patient have difficulty dressing or bathing?: No Does the patient have difficulty doing errands alone such as visiting a doctor's office or shopping?: No     01/21/2021    9:54 AM 04/21/2021    8:53 AM 07/21/2021    9:25 AM 10/20/2021    9:19 AM 02/02/2022    9:48 AM  Fall Risk  Falls in the past year? 0 1 1 0 0  Was there an injury with Fall?  0 0  0  Fall Risk Category Calculator  1 1  0  Fall Risk Category  Low Low  Low  Patient Fall Risk Level Low fall risk  Low fall risk Low fall risk  Low fall risk  Patient at Risk for Falls Due to No Fall Risks    No Fall Risks  Fall risk Follow up Falls evaluation completed Falls evaluation completed Falls evaluation completed  Falls evaluation completed       02/02/2022    9:48 AM  Depression screen PHQ 2/9  Decreased Interest 0  Down, Depressed, Hopeless 0  PHQ - 2 Score 0       Current Medication: Outpatient Encounter Medications as of 02/02/2022  Medication Sig   aspirin EC 81 MG tablet Take 81 mg by mouth daily. Swallow whole.   blood glucose meter kit and supplies KIT Dispense based on patient and insurance preference. Use twice daily as directed   Continuous Blood Gluc Sensor (FREESTYLE LIBRE 2 SENSOR) MISC 1 Device by Does not apply route every 14 (fourteen) days.   finasteride (PROSCAR) 5 MG tablet Take 1 tablet (5 mg total) by mouth daily.   glipiZIDE (GLUCOTROL XL) 10 MG 24 hr tablet TAKE 1 TABLET BY MOUTH DAILY WITH BREAKFAST   glucose blood test strip Use as instructed to test blood sugar twice daily   Lancets 28G MISC by Does  not apply route daily.   tamsulosin (FLOMAX) 0.4 MG CAPS capsule Take 1 capsule (0.4 mg total) by mouth daily.   [DISCONTINUED] DULoxetine (CYMBALTA) 60 MG capsule Take 1 capsule (60 mg total) by mouth daily.   [DISCONTINUED] ezetimibe (ZETIA) 10 MG tablet Take 1 tablet (10 mg total) by mouth daily.   [DISCONTINUED] gabapentin (NEURONTIN) 800 MG tablet Take 1 tablet (800 mg total) by mouth 3 (three) times daily.   [DISCONTINUED] gemfibrozil (LOPID) 600 MG tablet Take 1 tablet (600 mg total) by mouth 2 (two) times daily before a meal.   [DISCONTINUED] lisinopril (ZESTRIL) 20 MG tablet Take 1 tablet (20 mg total) by mouth daily. Take one tab po qd   [DISCONTINUED] metoprolol tartrate (LOPRESSOR) 50 MG tablet Take 1 tablet (50 mg total) by mouth 2 (two) times daily.   [DISCONTINUED] omeprazole (PRILOSEC) 20 MG capsule Take 1 capsule (20 mg total) by mouth daily.    [DISCONTINUED] pioglitazone (ACTOS) 15 MG tablet Take 1 tablet (15 mg total) by mouth daily.   DULoxetine (CYMBALTA) 60 MG capsule Take 1 capsule (60 mg total) by mouth daily.   ezetimibe (ZETIA) 10 MG tablet Take 1 tablet (10 mg total) by mouth daily.   gabapentin (NEURONTIN) 800 MG tablet Take 1 tablet (800 mg total) by mouth 3 (three) times daily.   gemfibrozil (LOPID) 600 MG tablet Take 1 tablet (600 mg total) by mouth 2 (two) times daily before a meal.   lisinopril (ZESTRIL) 20 MG tablet Take 1 tablet (20 mg total) by mouth daily. Take one tab po qd   metoprolol tartrate (LOPRESSOR) 50 MG tablet Take 1 tablet (50 mg total) by mouth 2 (two) times daily.   omeprazole (PRILOSEC) 20 MG capsule Take 1 capsule (20 mg total) by mouth daily.   pioglitazone (ACTOS) 15 MG tablet Take 1 tablet (15 mg total) by mouth daily.   No facility-administered encounter medications on file as of 02/02/2022.    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     Family History: Family History  Problem Relation Age of Onset   Hypertension Mother     Social History   Socioeconomic History   Marital status: Married    Spouse name: Not on file   Number of children: Not on file   Years of education: Not on file   Highest education level: Not on file  Occupational History   Not on file  Tobacco Use   Smoking status: Never   Smokeless tobacco: Never  Substance and Sexual Activity    Alcohol use: Yes    Alcohol/week: 1.0 standard drink of alcohol    Types: 1 Cans of beer per week    Comment: occ   Drug use: No   Sexual activity: Not on file  Other Topics Concern   Not on file  Social History Narrative   Not on file   Social Determinants of Health   Financial Resource Strain: Low  Risk  (05/19/2020)   Overall Financial Resource Strain (CARDIA)    Difficulty of Paying Living Expenses: Not hard at all  Food Insecurity: Not on file  Transportation Needs: Not on file  Physical Activity: Not on file  Stress: Not on file  Social Connections: Not on file  Intimate Partner Violence: Not on file      Review of Systems  Constitutional:  Negative for activity change, appetite change, chills, fatigue, fever and unexpected weight change.  HENT: Negative.  Negative for congestion, ear pain, rhinorrhea, sore throat and trouble swallowing.   Eyes: Negative.   Respiratory: Negative.  Negative for cough, chest tightness, shortness of breath and wheezing.   Cardiovascular: Negative.  Negative for chest pain.  Gastrointestinal: Negative.  Negative for abdominal pain, blood in stool, constipation, diarrhea, nausea and vomiting.  Endocrine: Negative.   Genitourinary: Negative.  Negative for difficulty urinating, dysuria, frequency, hematuria and urgency.  Musculoskeletal: Negative.  Negative for arthralgias, back pain, joint swelling, myalgias and neck pain.  Skin: Negative.  Negative for rash and wound.  Allergic/Immunologic: Negative.  Negative for immunocompromised state.  Neurological: Negative.  Negative for dizziness, seizures, numbness and headaches.  Hematological: Negative.   Psychiatric/Behavioral: Negative.  Negative for behavioral problems, self-injury and suicidal ideas. The patient is not nervous/anxious.     Vital Signs: BP 125/61   Pulse 85   Temp 98.5 F (36.9 C)   Resp 16   Ht _0  (1.753 m)   Wt 187 lb 6.4 oz (85 kg)   SpO2 97%   BMI 27.67 kg/m     Physical Exam Vitals reviewed.  Constitutional:      General: He is awake. He is not in acute distress.    Appearance: Normal appearance. He is well-developed, well-groomed and normal weight. He is not ill-appearing or diaphoretic.  HENT:     Head: Normocephalic and atraumatic.     Right Ear: Tympanic membrane, ear canal and external ear normal.     Left Ear: Tympanic membrane, ear canal and external ear normal.     Nose: Nose normal. No congestion or rhinorrhea.     Mouth/Throat:     Lips: Pink.     Mouth: Mucous membranes are moist.     Pharynx: Oropharynx is clear. Uvula midline. No oropharyngeal exudate or posterior oropharyngeal erythema.  Eyes:     General: Lids are normal. Vision grossly intact. Gaze aligned appropriately. No scleral icterus.       Right eye: No discharge.        Left eye: No discharge.     Extraocular Movements: Extraocular movements intact.     Conjunctiva/sclera: Conjunctivae normal.     Pupils: Pupils are equal, round, and reactive to light.     Funduscopic exam:    Right eye: Red reflex present.        Left eye: Red reflex present. Neck:     Thyroid: No thyromegaly.     Vascular: No JVD.     Trachea: Trachea and phonation normal. No tracheal deviation.  Cardiovascular:     Rate and Rhythm: Normal rate and regular rhythm.     Pulses: Normal pulses.          Dorsalis pedis pulses are 2+ on the right side and 2+ on the left side.       Posterior tibial pulses are 2+ on the right side and 2+ on the left side.     Heart sounds: Normal heart sounds, S1 normal and  S2 normal. No murmur heard.    No friction rub. No gallop.  Pulmonary:     Effort: Pulmonary effort is normal. No accessory muscle usage or respiratory distress.     Breath sounds: Normal breath sounds and air entry. No stridor. No wheezing or rales.  Chest:     Chest wall: No tenderness.  Abdominal:     General: Bowel sounds are normal. There is no distension.     Palpations: Abdomen is  soft. There is no shifting dullness, fluid wave, mass or pulsatile mass.     Tenderness: There is no abdominal tenderness. There is no guarding or rebound.  Musculoskeletal:        General: No tenderness or deformity. Normal range of motion.     Cervical back: Normal range of motion and neck supple.     Right lower leg: No edema.     Left lower leg: No edema.     Right foot: Normal range of motion. No deformity, bunion, Charcot foot, foot drop or prominent metatarsal heads.     Left foot: Normal range of motion. No deformity, bunion, Charcot foot, foot drop or prominent metatarsal heads.  Feet:     Right foot:     Protective Sensation: 6 sites tested.  6 sites sensed.     Skin integrity: Callus and dry skin present. No ulcer, blister, skin breakdown, erythema, warmth or fissure.     Toenail Condition: Right toenails are ingrown.     Left foot:     Protective Sensation: 6 sites tested.  6 sites sensed.     Skin integrity: Callus and dry skin present. No ulcer, blister, skin breakdown, erythema, warmth or fissure.     Toenail Condition: Left toenails are ingrown.  Lymphadenopathy:     Cervical: No cervical adenopathy.  Skin:    General: Skin is warm and dry.     Capillary Refill: Capillary refill takes less than 2 seconds.     Coloration: Skin is not pale.     Findings: No erythema or rash.  Neurological:     Mental Status: He is alert and oriented to person, place, and time.     Cranial Nerves: No cranial nerve deficit.     Motor: No abnormal muscle tone.     Coordination: Coordination normal.     Gait: Gait normal.     Deep Tendon Reflexes: Reflexes are normal and symmetric.  Psychiatric:        Mood and Affect: Mood normal.        Behavior: Behavior normal. Behavior is cooperative.        Thought Content: Thought content normal.        Judgment: Judgment normal.     Diabetic Foot Exam - Simple   Simple Foot Form Diabetic Foot exam was performed with the following findings:  Yes 02/02/2022 11:00 AM  Visual Inspection Sensation Testing Pulse Check Comments       Assessment/Plan: 1. Encounter for general adult medical examination with abnormal findings Age-appropriate preventive screenings and vaccinations discussed, annual physical exam completed. Routine labs for health maintenance results discussed at prior office visit on 01/16/22. PHM updated.   2. Type 2 diabetes mellitus with diabetic neuropathy, without long-term current use of insulin (HCC) A1c is stable, will repeat in 3 months. Continue current medications as prescribed. Takes medications for diabetic neuropathy as well, continue as prescribed. Diabetic foot exam done today.  - DULoxetine (CYMBALTA) 60 MG capsule; Take 1 capsule (60 mg  total) by mouth daily.  Dispense: 90 capsule; Refill: 1 - gabapentin (NEURONTIN) 800 MG tablet; Take 1 tablet (800 mg total) by mouth 3 (three) times daily.  Dispense: 270 tablet; Refill: 1 - pioglitazone (ACTOS) 15 MG tablet; Take 1 tablet (15 mg total) by mouth daily.  Dispense: 90 tablet; Refill: 1  3. Essential hypertension, benign Stable, continue medications as prescribed, refills ordered accordingly - lisinopril (ZESTRIL) 20 MG tablet; Take 1 tablet (20 mg total) by mouth daily. Take one tab po qd  Dispense: 90 tablet; Refill: 1 - metoprolol tartrate (LOPRESSOR) 50 MG tablet; Take 1 tablet (50 mg total) by mouth 2 (two) times daily.  Dispense: 180 tablet; Refill: 1  4. Medication refill - ezetimibe (ZETIA) 10 MG tablet; Take 1 tablet (10 mg total) by mouth daily.  Dispense: 90 tablet; Refill: 1 - DULoxetine (CYMBALTA) 60 MG capsule; Take 1 capsule (60 mg total) by mouth daily.  Dispense: 90 capsule; Refill: 1 - gabapentin (NEURONTIN) 800 MG tablet; Take 1 tablet (800 mg total) by mouth 3 (three) times daily.  Dispense: 270 tablet; Refill: 1 - gemfibrozil (LOPID) 600 MG tablet; Take 1 tablet (600 mg total) by mouth 2 (two) times daily before a meal.  Dispense:  180 tablet; Refill: 1 - lisinopril (ZESTRIL) 20 MG tablet; Take 1 tablet (20 mg total) by mouth daily. Take one tab po qd  Dispense: 90 tablet; Refill: 1 - metoprolol tartrate (LOPRESSOR) 50 MG tablet; Take 1 tablet (50 mg total) by mouth 2 (two) times daily.  Dispense: 180 tablet; Refill: 1 - omeprazole (PRILOSEC) 20 MG capsule; Take 1 capsule (20 mg total) by mouth daily.  Dispense: 90 capsule; Refill: 1 - pioglitazone (ACTOS) 15 MG tablet; Take 1 tablet (15 mg total) by mouth daily.  Dispense: 90 tablet; Refill: 1  5. Dysuria Routine urinalysis done - Urinalysis, Routine w reflex microscopic    General Counseling: ahmet schank understanding of the findings of todays visit and agrees with plan of treatment. I have discussed any further diagnostic evaluation that may be needed or ordered today. We also reviewed his medications today. he has been encouraged to call the office with any questions or concerns that should arise related to todays visit.    No orders of the defined types were placed in this encounter.   Meds ordered this encounter  Medications   ezetimibe (ZETIA) 10 MG tablet    Sig: Take 1 tablet (10 mg total) by mouth daily.    Dispense:  90 tablet    Refill:  1   DULoxetine (CYMBALTA) 60 MG capsule    Sig: Take 1 capsule (60 mg total) by mouth daily.    Dispense:  90 capsule    Refill:  1   gabapentin (NEURONTIN) 800 MG tablet    Sig: Take 1 tablet (800 mg total) by mouth 3 (three) times daily.    Dispense:  270 tablet    Refill:  1   gemfibrozil (LOPID) 600 MG tablet    Sig: Take 1 tablet (600 mg total) by mouth 2 (two) times daily before a meal.    Dispense:  180 tablet    Refill:  1   lisinopril (ZESTRIL) 20 MG tablet    Sig: Take 1 tablet (20 mg total) by mouth daily. Take one tab po qd    Dispense:  90 tablet    Refill:  1   metoprolol tartrate (LOPRESSOR) 50 MG tablet    Sig: Take 1 tablet (50 mg  total) by mouth 2 (two) times daily.    Dispense:  180  tablet    Refill:  1   omeprazole (PRILOSEC) 20 MG capsule    Sig: Take 1 capsule (20 mg total) by mouth daily.    Dispense:  90 capsule    Refill:  1   pioglitazone (ACTOS) 15 MG tablet    Sig: Take 1 tablet (15 mg total) by mouth daily.    Dispense:  90 tablet    Refill:  1    Return in about 3 months (around 05/04/2022) for F/U, Recheck A1C, Ellis Koffler PCP.   Total time spent:30 Minutes Time spent includes review of chart, medications, test results, and follow up plan with the patient.   [place] Controlled Substance Database was reviewed by me.  This patient was seen by Jonetta Osgood, FNP-C in collaboration with Dr. Clayborn Bigness as a part of collaborative care agreement.  Christos Mixson R. Valetta Fuller, MSN, FNP-C Internal medicine

## 2022-02-02 NOTE — Addendum Note (Signed)
Addended by: Jonetta Osgood on: 02/02/2022 11:09 AM   Modules accepted: Orders

## 2022-02-03 LAB — URINALYSIS, ROUTINE W REFLEX MICROSCOPIC
Bilirubin, UA: NEGATIVE
Glucose, UA: NEGATIVE
Ketones, UA: NEGATIVE
Leukocytes,UA: NEGATIVE
Nitrite, UA: NEGATIVE
Protein,UA: NEGATIVE
RBC, UA: NEGATIVE
Specific Gravity, UA: 1.022 (ref 1.005–1.030)
Urobilinogen, Ur: 0.2 mg/dL (ref 0.2–1.0)
pH, UA: 6 (ref 5.0–7.5)

## 2022-02-22 ENCOUNTER — Encounter: Payer: Self-pay | Admitting: Podiatry

## 2022-02-22 ENCOUNTER — Ambulatory Visit: Payer: Medicare HMO | Admitting: Podiatry

## 2022-02-22 VITALS — BP 129/63 | HR 79

## 2022-02-22 DIAGNOSIS — L600 Ingrowing nail: Secondary | ICD-10-CM | POA: Diagnosis not present

## 2022-02-22 MED ORDER — NEOMYCIN-POLYMYXIN-HC 1 % OT SOLN: OTIC | 1 refills | Status: DC

## 2022-02-22 NOTE — Progress Notes (Signed)
Subjective:  Patient ID: Shawn Greene, male    DOB: Jan 25, 1951,  MRN: [id number] HPI Chief Complaint  Patient presents with   Toe Pain    Hallux bilateral - both borders, ingrown x months, has neuropathy, tried neosporin   New Patient (Initial Visit)   Diabetes    Last A1c 6.?    72 y.o. male presents with the above complaint.   ROS: Denies fever chills nausea vomit muscle aches pains calf pain back pain chest pain shortness of breath.  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 Swallow whole., Disp: , Rfl:    blood glucose meter kit and supplies KIT, Dispense based on patient and insurance preference. Use twice daily as directed, Disp: 1 each, Rfl: 3   Continuous Blood Gluc Sensor (FREESTYLE LIBRE 2 SENSOR) MISC, 1 Device by Does not apply route every 14 (fourteen) days., Disp: 1 each, Rfl: 2   DULoxetine (CYMBALTA) 60 MG capsule, Take 1 capsule (60 mg total) by mouth daily., Disp: 90 capsule, Rfl: 1   ezetimibe (ZETIA) 10 MG tablet, Take 1 tablet (10 mg total) by mouth daily., Disp: 90 tablet, Rfl: 1   finasteride (PROSCAR) 5 MG tablet, Take 1 tablet (5 mg total) by mouth daily., Disp: 90 tablet, Rfl: 3   gabapentin (NEURONTIN) 800 MG tablet, Take 1 tablet (800 mg total) by  mouth 3 (three) times daily., Disp: 270 tablet, Rfl: 1   gemfibrozil (LOPID) 600 MG tablet, Take 1 tablet (600 mg total) by mouth 2 (two) times daily before a meal., Disp: 180 tablet, Rfl: 1   glipiZIDE (GLUCOTROL XL) 10 MG 24 hr tablet, TAKE 1 TABLET BY MOUTH DAILY WITH BREAKFAST, Disp: 90 tablet, Rfl: 1   glucose blood test strip, Use as instructed to test blood sugar twice daily, Disp: 100 each, Rfl: 3   Lancets 28G MISC, by Does not apply route daily., Disp: , Rfl:    lisinopril (ZESTRIL) 20 MG tablet, Take 1 tablet (20 mg total) by mouth daily. Take one tab po qd, Disp: 90 tablet, Rfl: 1   metoprolol tartrate (LOPRESSOR) 50 MG tablet, Take 1 tablet (50 mg total) by mouth 2 (two) times daily., Disp: 180 tablet, Rfl: 1   omeprazole (PRILOSEC) 20 MG capsule, Take 1 capsule (20 mg total) by mouth daily., Disp: 90 capsule, Rfl: 1   pioglitazone (ACTOS) 15 MG tablet, Take 1 tablet (15 mg total) by mouth daily., Disp: 90 tablet, Rfl: 1   tamsulosin (FLOMAX) 0.4 MG CAPS capsule, Take 1 capsule (0.4 mg total) by mouth daily., Disp: 90 capsule, Rfl: 3  Allergies  Allergen Reactions   Pollinex-T [Modified Tree Tyrosine Adsorbate]    Review of Systems Objective:   Vitals:   02/22/22  0943  BP: 129/63  Pulse: 79    General: Well developed, nourished, in no acute distress, alert and oriented x3   Dermatological: Skin is warm, dry and supple bilateral. Nails x 10 are well maintained; remaining integument appears unremarkable at this time. There are no open sores, no preulcerative lesions, no rash or signs of infection present.  Sharp and abraded nail margin on the tibial-fibular border with mild erythema no purulence no malodor exquisitely tender on palpation.  Vascular: Dorsalis Pedis artery and Posterior Tibial artery pedal pulses are 2/4 bilateral with immedate capillary fill time. Pedal hair growth present. No varicosities and no lower extremity edema present bilateral.   Neruologic: Grossly  intact via light touch bilateral. Vibratory intact via tuning fork bilateral. Protective threshold with Semmes Wienstein monofilament intact to all pedal sites bilateral. Patellar and Achilles deep tendon reflexes 2+ bilateral. No Babinski or clonus noted bilateral.   Musculoskeletal: No gross boney pedal deformities bilateral. No pain, crepitus, or limitation noted with foot and ankle range of motion bilateral. Muscular strength 5/5 in all groups tested bilateral.  Gait: Unassisted, Nonantalgic.    Radiographs:  None taken  Assessment & Plan:   Assessment: Ingrown toenails tibial-fibular border the hallux bilateral  Plan: Discussed etiology pathology conservative or surgical therapies.  At this point a performed a chemical matricectomy to the tibial-fibular border the hallux bilateral tolerated procedure well without complications.  Was given both oral and written home-going structure for the care and soaking of the toe as well as a prescription for Cortisporin Otic to be applied twice daily after soaking.     Lella Mullany T. [place], [place]

## 2022-02-22 NOTE — Patient Instructions (Signed)

## 2022-03-15 ENCOUNTER — Ambulatory Visit: Payer: Medicare HMO | Admitting: Podiatry

## 2022-03-15 ENCOUNTER — Encounter: Payer: Self-pay | Admitting: Podiatry

## 2022-03-15 DIAGNOSIS — L600 Ingrowing nail: Secondary | ICD-10-CM

## 2022-03-15 DIAGNOSIS — Z9889 Other specified postprocedural states: Secondary | ICD-10-CM

## 2022-03-15 NOTE — Progress Notes (Signed)
He presents today for nail check hallux bilaterally.  He states that he is doing very well he is recovering well.  He states that he has been putting a cover over them daily.  States that he stopped sucking.  Objective: Vital signs stable alert oriented x 3 there is minimal erythema no edema some drainage appears to be serosanguineous in nature and mild tenderness on palpation of the proximal nail fold.  Assessment: Well-healing surgical toes hallux bilateral tibia-fibula border.  Plan: Discussed etiology pathology conservative surgical therapies at this point continue to soak Epsom salts and warm water once daily or every other day continue to cover during the day leave open at bedtime but continue to do this until there is no redness no drainage and no pain on palpation.  That he may discontinue.

## 2022-04-20 ENCOUNTER — Telehealth: Payer: Self-pay | Admitting: Urology

## 2022-04-20 MED ORDER — FINASTERIDE 5 MG PO TABS: 5.0000 mg | ORAL_TABLET | Freq: Every day | ORAL | 2 refills | Status: DC

## 2022-04-20 NOTE — Telephone Encounter (Signed)
Pt would like a refill for Finasteride sent to [place].

## 2022-04-20 NOTE — Telephone Encounter (Signed)
Pts wife aware med erxed.

## 2022-05-03 DIAGNOSIS — H524 Presbyopia: Secondary | ICD-10-CM | POA: Diagnosis not present

## 2022-05-03 DIAGNOSIS — E113393 Type 2 diabetes mellitus with moderate nonproliferative diabetic retinopathy without macular edema, bilateral: Secondary | ICD-10-CM | POA: Diagnosis not present

## 2022-05-05 ENCOUNTER — Encounter: Payer: Self-pay | Admitting: Nurse Practitioner

## 2022-05-05 ENCOUNTER — Ambulatory Visit (INDEPENDENT_AMBULATORY_CARE_PROVIDER_SITE_OTHER): Payer: Medicare HMO | Admitting: Nurse Practitioner

## 2022-05-05 VITALS — BP 108/62 | HR 80 | Temp 98.3°F | Resp 16 | Ht 69.0 in | Wt 189.0 lb

## 2022-05-05 DIAGNOSIS — E1140 Type 2 diabetes mellitus with diabetic neuropathy, unspecified: Secondary | ICD-10-CM

## 2022-05-05 DIAGNOSIS — I10 Essential (primary) hypertension: Secondary | ICD-10-CM | POA: Diagnosis not present

## 2022-05-05 DIAGNOSIS — Z888 Allergy status to other drugs, medicaments and biological substances status: Secondary | ICD-10-CM | POA: Diagnosis not present

## 2022-05-05 DIAGNOSIS — E782 Mixed hyperlipidemia: Secondary | ICD-10-CM

## 2022-05-05 LAB — POCT GLYCOSYLATED HEMOGLOBIN (HGB A1C): Hemoglobin A1C: 6.3 % — AB (ref 4.0–5.6)

## 2022-05-05 NOTE — Progress Notes (Signed)
[facility] [place], [place] [address]  Internal MEDICINE  Office Visit Note  Patient Name: Shawn Greene  [id number]  [id number]  Date of Service: 05/05/2022  Chief Complaint  Patient presents with   Diabetes   Hyperlipidemia   Follow-up    HPI Shmar presents for a follow-up visit for diabetes and hypertension Diabetes -- controlled, A1c did go up to 6.3 but still within goal range.  Hypertension -- bp well controlled with current medications. Hyperlipidemia -- takes ezetimibe and gemfibrozil. Does not take statins due to having a rash from taking one of them.   Current Medication: Outpatient Encounter Medications as of 05/05/2022  Medication Sig   aspirin EC 81 MG tablet Take 81 mg by mouth daily. Swallow whole.   blood glucose meter kit and supplies KIT Dispense based on patient and insurance preference. Use twice daily as directed   Continuous Blood Gluc Sensor (FREESTYLE LIBRE 2 SENSOR) MISC 1 Device by Does not apply route every 14 (fourteen) days.   DULoxetine (CYMBALTA) 60 MG capsule Take 1 capsule (60 mg total) by mouth daily.   ezetimibe (ZETIA) 10 MG tablet Take 1 tablet (10 mg total) by mouth daily.   finasteride (PROSCAR) 5 MG tablet Take 1 tablet (5 mg total) by mouth daily.   gabapentin (NEURONTIN) 800 MG tablet Take 1 tablet (800 mg total) by mouth 3 (three) times daily.   gemfibrozil (LOPID) 600 MG tablet Take 1 tablet (600 mg total) by mouth 2 (two) times daily before a meal.   glipiZIDE (GLUCOTROL XL) 10 MG 24 hr tablet TAKE 1 TABLET BY MOUTH DAILY WITH BREAKFAST   glucose blood test strip Use as instructed to test blood sugar twice daily   Lancets 28G MISC by Does not apply route daily.   lisinopril (ZESTRIL) 20 MG tablet Take 1 tablet (20 mg total) by mouth daily. Take one tab po qd   metoprolol tartrate (LOPRESSOR) 50 MG tablet Take 1 tablet (50 mg total) by mouth 2 (two) times daily.   NEOMYCIN-POLYMYXIN-HYDROCORTISONE (CORTISPORIN)  1 % SOLN OTIC solution Apply 1-2 drops to toe BID after soaking   omeprazole (PRILOSEC) 20 MG capsule Take 1 capsule (20 mg total) by mouth daily.   pioglitazone (ACTOS) 15 MG tablet Take 1 tablet (15 mg total) by mouth daily.   tamsulosin (FLOMAX) 0.4 MG CAPS capsule Take 1 capsule (0.4 mg total) by mouth daily.   [DISCONTINUED] finasteride (PROSCAR) 5 MG tablet Take 1 tablet (5 mg total) by mouth daily.   No facility-administered encounter medications on file as of 05/05/2022.    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     Family History: Family History  Problem Relation Age of Onset   Hypertension Mother     Social History   Socioeconomic History   Marital status: Married    Spouse name: Not on file   Number of children: Not on file  Years of education: Not on file   Highest education level: Not on file  Occupational History   Not on file  Tobacco Use   Smoking status: Never   Smokeless tobacco: Never  Substance and Sexual Activity   Alcohol use: Yes    Alcohol/week: 1.0 standard drink of alcohol    Types: 1 Cans of beer per week    Comment: occ   Drug use: No   Sexual activity: Not on file  Other Topics Concern   Not on file  Social History Narrative   Not on file   Social Determinants of Health   Financial Resource Strain: Low Risk  (05/19/2020)   Overall Financial Resource Strain (CARDIA)    Difficulty of Paying Living Expenses: Not  hard at all  Food Insecurity: Not on file  Transportation Needs: Not on file  Physical Activity: Not on file  Stress: Not on file  Social Connections: Not on file  Intimate Partner Violence: Not on file      Review of Systems  Constitutional:  Negative for chills, fatigue and unexpected weight change.  HENT:  Negative for congestion, rhinorrhea, sneezing and sore throat.   Eyes:  Negative for redness.  Respiratory:  Negative for cough, chest tightness and shortness of breath.   Cardiovascular:  Negative for chest pain and palpitations.  Gastrointestinal:  Negative for abdominal pain, constipation, diarrhea, nausea and vomiting.  Genitourinary:  Negative for dysuria and frequency.  Musculoskeletal:  Negative for arthralgias, back pain, joint swelling and neck pain.  Skin:  Negative for rash.  Neurological: Negative.  Negative for tremors and numbness.  Hematological:  Negative for adenopathy. Does not bruise/bleed easily.  Psychiatric/Behavioral:  Negative for behavioral problems (Depression), sleep disturbance and suicidal ideas. The patient is not nervous/anxious.     Vital Signs: BP 108/62   Pulse 80   Temp 98.3 F (36.8 C)   Resp 16   Ht 5\' 9"  (1.753 m)   Wt 189 lb (85.7 kg)   SpO2 95%   BMI 27.91 kg/m    Physical Exam Vitals reviewed.  Constitutional:      General: He is not in acute distress.    Appearance: Normal appearance. He is not ill-appearing.  HENT:     Head: Normocephalic and atraumatic.  Eyes:     Pupils: Pupils are equal, round, and reactive to light.  Cardiovascular:     Rate and Rhythm: Normal rate and regular rhythm.  Pulmonary:     Effort: Pulmonary effort is normal. No respiratory distress.  Neurological:     Mental Status: He is alert and oriented to person, place, and time.  Psychiatric:        Mood and Affect: Mood normal.        Behavior: Behavior normal.        Assessment/Plan: 1. Type 2 diabetes mellitus with diabetic  neuropathy, without long-term current use of insulin (HCC) Stable, A1c is 6.3, continue current medications as prescribed and diet and lifestyle modification as discussed.  - POCT glycosylated hemoglobin (Hb A1C)  2. Essential hypertension, benign Stable, continue medications as prescribed.   3. Mixed hyperlipidemia Continue ezetimibe and gemfibrozil as prescribed.   4. Allergy to statin medication Cannot take statin medications   General Counseling: trayvin davide understanding of the findings of todays visit and agrees with plan of treatment. I have discussed any further diagnostic evaluation that may be needed or ordered today. We also reviewed his medications today. he has been encouraged to  call the office with any questions or concerns that should arise related to todays visit.    Orders Placed This Encounter  Procedures   POCT glycosylated hemoglobin (Hb A1C)    No orders of the defined types were placed in this encounter.   Return in about 6 months (around 11/05/2022), or if symptoms worsen or fail to improve, for F/U, Recheck A1C, Deleah Tison PCP.   Total time spent:30 Minutes Time spent includes review of chart, medications, test results, and follow up plan with the patient.   [place] Controlled Substance Database was reviewed by me.  This patient was seen by Jonetta Osgood, FNP-C in collaboration with Dr. Clayborn Bigness as a part of collaborative care agreement.   Cindi Ghazarian R. Valetta Fuller, MSN, FNP-C Internal medicine

## 2022-07-18 ENCOUNTER — Other Ambulatory Visit: Payer: Self-pay | Admitting: Internal Medicine

## 2022-07-18 DIAGNOSIS — E1140 Type 2 diabetes mellitus with diabetic neuropathy, unspecified: Secondary | ICD-10-CM

## 2022-08-09 ENCOUNTER — Telehealth: Payer: Self-pay | Admitting: Nurse Practitioner

## 2022-08-09 NOTE — Telephone Encounter (Signed)
Lvm to schedule diabetic eye exam-Shawn Greene 

## 2022-08-10 ENCOUNTER — Other Ambulatory Visit: Payer: Self-pay | Admitting: Nurse Practitioner

## 2022-08-10 DIAGNOSIS — Z760 Encounter for issue of repeat prescription: Secondary | ICD-10-CM

## 2022-09-27 ENCOUNTER — Other Ambulatory Visit: Payer: Self-pay | Admitting: Nurse Practitioner

## 2022-09-27 DIAGNOSIS — I10 Essential (primary) hypertension: Secondary | ICD-10-CM

## 2022-09-27 DIAGNOSIS — Z760 Encounter for issue of repeat prescription: Secondary | ICD-10-CM

## 2022-09-27 DIAGNOSIS — E1140 Type 2 diabetes mellitus with diabetic neuropathy, unspecified: Secondary | ICD-10-CM

## 2022-09-27 NOTE — Telephone Encounter (Signed)
Can you review and send?

## 2022-10-12 DIAGNOSIS — D2272 Melanocytic nevi of left lower limb, including hip: Secondary | ICD-10-CM | POA: Diagnosis not present

## 2022-10-12 DIAGNOSIS — L578 Other skin changes due to chronic exposure to nonionizing radiation: Secondary | ICD-10-CM | POA: Diagnosis not present

## 2022-10-12 DIAGNOSIS — D2271 Melanocytic nevi of right lower limb, including hip: Secondary | ICD-10-CM | POA: Diagnosis not present

## 2022-10-12 DIAGNOSIS — L538 Other specified erythematous conditions: Secondary | ICD-10-CM | POA: Diagnosis not present

## 2022-10-12 DIAGNOSIS — L298 Other pruritus: Secondary | ICD-10-CM | POA: Diagnosis not present

## 2022-10-12 DIAGNOSIS — D2262 Melanocytic nevi of left upper limb, including shoulder: Secondary | ICD-10-CM | POA: Diagnosis not present

## 2022-10-12 DIAGNOSIS — D2261 Melanocytic nevi of right upper limb, including shoulder: Secondary | ICD-10-CM | POA: Diagnosis not present

## 2022-10-12 DIAGNOSIS — L820 Inflamed seborrheic keratosis: Secondary | ICD-10-CM | POA: Diagnosis not present

## 2022-10-12 DIAGNOSIS — D225 Melanocytic nevi of trunk: Secondary | ICD-10-CM | POA: Diagnosis not present

## 2022-11-07 ENCOUNTER — Ambulatory Visit (INDEPENDENT_AMBULATORY_CARE_PROVIDER_SITE_OTHER): Payer: Medicare HMO | Admitting: Nurse Practitioner

## 2022-11-07 ENCOUNTER — Encounter: Payer: Self-pay | Admitting: Nurse Practitioner

## 2022-11-07 VITALS — BP 110/72 | HR 75 | Temp 98.2°F | Resp 16 | Ht 69.0 in | Wt 190.2 lb

## 2022-11-07 DIAGNOSIS — Z888 Allergy status to other drugs, medicaments and biological substances status: Secondary | ICD-10-CM

## 2022-11-07 DIAGNOSIS — I152 Hypertension secondary to endocrine disorders: Secondary | ICD-10-CM | POA: Diagnosis not present

## 2022-11-07 DIAGNOSIS — E1159 Type 2 diabetes mellitus with other circulatory complications: Secondary | ICD-10-CM | POA: Diagnosis not present

## 2022-11-07 DIAGNOSIS — E782 Mixed hyperlipidemia: Secondary | ICD-10-CM | POA: Diagnosis not present

## 2022-11-07 DIAGNOSIS — Z79899 Other long term (current) drug therapy: Secondary | ICD-10-CM | POA: Diagnosis not present

## 2022-11-07 LAB — POCT GLYCOSYLATED HEMOGLOBIN (HGB A1C): Hemoglobin A1C: 6.1 % — AB (ref 4.0–5.6)

## 2022-11-07 MED ORDER — DULOXETINE HCL 60 MG PO CPEP: 60.0000 mg | ORAL_CAPSULE | Freq: Every day | ORAL | 1 refills | Status: DC

## 2022-11-07 MED ORDER — OMEPRAZOLE 20 MG PO CPDR: 20.0000 mg | DELAYED_RELEASE_CAPSULE | Freq: Every day | ORAL | 1 refills | Status: DC

## 2022-11-07 MED ORDER — GLIPIZIDE ER 10 MG PO TB24: 10.0000 mg | ORAL_TABLET | Freq: Every day | ORAL | 1 refills | Status: DC

## 2022-11-07 MED ORDER — EZETIMIBE 10 MG PO TABS: 10.0000 mg | ORAL_TABLET | Freq: Every day | ORAL | 1 refills | Status: DC

## 2022-11-07 MED ORDER — GEMFIBROZIL 600 MG PO TABS
600.0000 mg | ORAL_TABLET | Freq: Two times a day (BID) | ORAL | 1 refills | Status: DC
Start: 2022-11-07 — End: 2023-02-05

## 2022-11-07 MED ORDER — DEXCOM G7 SENSOR MISC
11 refills | Status: DC
Start: 2022-11-07 — End: 2023-02-05

## 2022-11-07 MED ORDER — LISINOPRIL 20 MG PO TABS
20.0000 mg | ORAL_TABLET | Freq: Every day | ORAL | 1 refills | Status: DC
Start: 2022-11-07 — End: 2023-02-05

## 2022-11-07 NOTE — Progress Notes (Signed)
[facility] [address] [place], [place] [address]  Internal MEDICINE  Office Visit Note  Patient Name: Shawn Greene  [id number]  [id number]  Date of Service: 11/07/2022  Chief Complaint  Patient presents with  . Diabetes  . Hypertension  . Hyperlipidemia  . Follow-up    HPI Shawn Greene presents for a follow-up visit for diabetes, hypertension, and high cholesterol.  Diabetes -- A1c improved to 6.1. was unable to get freestyle libre, will try dexcom. Needs refills  Has allergy to statin medication -- develops rash. Due for upcoming labs. Cholesterol panel was normal except for low HDL at 38 last year. Is taking gemfibrozil and ezetimibe instead  Hypertension -- controlled with current medications, taking lisinopril and metoprolol tartrate.     Current Medication: Outpatient Encounter Medications as of 11/07/2022  Medication Sig  . aspirin EC 81 MG tablet Take 81 mg by mouth daily. Swallow whole.  . blood glucose meter kit and supplies KIT Dispense based on patient and insurance preference. Use twice daily as directed  . Continuous Glucose Sensor (DEXCOM G7 SENSOR) MISC Apply 1 sensor to skin every 10 day to monitor glucose for diabetes  . finasteride (PROSCAR) 5 MG tablet Take 1 tablet (5 mg total) by mouth daily.  Marland Kitchen gabapentin (NEURONTIN) 800 MG tablet TAKE ONE TABLET BY MOUTH THREE TIMES A DAY  . glucose blood test strip Use as instructed to test blood sugar twice daily  . Lancets 28G MISC by Does not apply route daily.  . metoprolol tartrate (LOPRESSOR) 50 MG tablet TAKE 1 TABLET BY MOUTH TWICE A DAY  . NEOMYCIN-POLYMYXIN-HYDROCORTISONE (CORTISPORIN) 1 % SOLN OTIC solution Apply 1-2 drops to toe BID after soaking  . pioglitazone (ACTOS) 15 MG tablet TAKE 1 TABLET BY MOUTH DAILY  . tamsulosin (FLOMAX) 0.4 MG CAPS capsule Take 1 capsule (0.4 mg total) by mouth daily.  . [DISCONTINUED] Continuous Blood Gluc Sensor (FREESTYLE LIBRE 2 SENSOR) MISC 1 Device by Does not apply  route every 14 (fourteen) days.  . [DISCONTINUED] DULoxetine (CYMBALTA) 60 MG capsule Take 1 capsule (60 mg total) by mouth daily.  . [DISCONTINUED] ezetimibe (ZETIA) 10 MG tablet TAKE 1 TABLET BY MOUTH DAILY  . [DISCONTINUED] gemfibrozil (LOPID) 600 MG tablet Take 1 tablet (600 mg total) by mouth 2 (two) times daily before a meal.  . [DISCONTINUED] glipiZIDE (GLUCOTROL XL) 10 MG 24 hr tablet TAKE 1 TABLET BY MOUTH DAILY WITH BREAKFAST  . [DISCONTINUED] lisinopril (ZESTRIL) 20 MG tablet Take 1 tablet (20 mg total) by mouth daily. Take one tab po qd  . [DISCONTINUED] omeprazole (PRILOSEC) 20 MG capsule Take 1 capsule (20 mg total) by mouth daily.  . DULoxetine (CYMBALTA) 60 MG capsule Take 1 capsule (60 mg total) by mouth daily.  Marland Kitchen ezetimibe (ZETIA) 10 MG tablet Take 1 tablet (10 mg total) by mouth daily.  Marland Kitchen gemfibrozil (LOPID) 600 MG tablet Take 1 tablet (600 mg total) by mouth 2 (two) times daily before a meal.  . glipiZIDE (GLUCOTROL XL) 10 MG 24 hr tablet Take 1 tablet (10 mg total) by mouth daily with breakfast.  . lisinopril (ZESTRIL) 20 MG tablet Take 1 tablet (20 mg total) by mouth daily. Take one tab po qd  . omeprazole (PRILOSEC) 20 MG capsule Take 1 capsule (20 mg total) by mouth daily.   No facility-administered encounter medications on file as of 11/07/2022.    Surgical History: Past Surgical History:  Procedure Laterality Date  . ANKLE SURGERY    .  COLECTOMY  2012  . COLONOSCOPY WITH PROPOFOL N/A 01/12/2021   Procedure: COLONOSCOPY WITH PROPOFOL;  Surgeon: Earline Mayotte, MD;  Location: [facility] ENDOSCOPY;  Service: Endoscopy;  Laterality: N/A;    Medical History: Past Medical History:  Diagnosis Date  . Chronic tension-type headache, not intractable   . Essential (primary) hypertension   . Mixed hyperlipidemia   . Psoriasis   . Type 2 diabetes, controlled, with neuropathy (HCC)   . 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 Hypertension Mother     Social History   Socioeconomic History  . Marital status: Married    Spouse name: Not on file  . Number of children: Not on file  . Years of education: Not on file  . Highest education level: Not on file  Occupational History  . Not on file  Tobacco Use  . Smoking status: Never  . Smokeless tobacco: Never  Substance and Sexual Activity  . Alcohol use: Yes    Alcohol/week: 1.0 standard drink of alcohol    Types: 1 Cans of beer per week    Comment: occ  . Drug use: No  . Sexual activity: Not on file  Other Topics Concern  . Not on file  Social History Narrative  . Not on file   Social Determinants of Health   Financial Resource Strain: Low Risk  (05/19/2020)   Overall Financial Resource Strain (CARDIA)   . Difficulty of Paying Living Expenses: Not hard at all  Food Insecurity: Not on file  Transportation Needs: Not on file  Physical Activity: Not on file  Stress: Not on file  Social Connections: Not on file  Intimate Partner Violence: Not on file      Review of Systems  Constitutional:  Negative for chills, fatigue and unexpected weight change.  HENT:  Negative for congestion, rhinorrhea, sneezing and sore throat.   Eyes:  Negative for redness.  Respiratory:  Negative for cough, chest tightness and shortness of breath.   Cardiovascular:  Negative for chest pain and palpitations.  Gastrointestinal:  Negative for abdominal pain, constipation, diarrhea, nausea and vomiting.  Genitourinary:  Negative for dysuria and frequency.  Musculoskeletal:  Negative for arthralgias, back pain, joint swelling and neck pain.  Skin:  Negative for rash.  Neurological: Negative.  Negative for tremors and numbness.  Hematological:  Negative for adenopathy. Does not bruise/bleed easily.  Psychiatric/Behavioral:  Negative for behavioral problems (Depression), sleep disturbance and suicidal ideas. The patient is not nervous/anxious.      Vital Signs: BP 110/72   Pulse 75   Temp 98.2 F (36.8 C)   Resp 16   Ht 5\' 9"  (1.753 m)   Wt 190 lb 3.2 oz (86.3 kg)   SpO2 94%   BMI 28.09 kg/m    Physical Exam Vitals reviewed.  Constitutional:      General: He is not in acute distress.    Appearance: Normal appearance. He is not ill-appearing.  HENT:     Head: Normocephalic and atraumatic.  Eyes:     Pupils: Pupils are equal, round, and reactive to light.  Cardiovascular:     Rate and Rhythm: Normal rate and regular rhythm.  Pulmonary:     Effort: Pulmonary effort is normal. No respiratory distress.  Neurological:     Mental Status: He is alert and oriented to person, place, and time.  Psychiatric:  Mood and Affect: Mood normal.        Behavior: Behavior normal.       Assessment/Plan: 1. Type 2 diabetes mellitus with other circulatory complication, without long-term current use of insulin (HCC) A1c slightly improved. Routine labs ordered. Dexcom ordered. Patient would like to try this if his insurance will approve it. Continue medications as prescribed. Repeat A1c in december - POCT glycosylated hemoglobin (Hb A1C) - gemfibrozil (LOPID) 600 MG tablet; Take 1 tablet (600 mg total) by mouth 2 (two) times daily before a meal.  Dispense: 180 tablet; Refill: 1 - glipiZIDE (GLUCOTROL XL) 10 MG 24 hr tablet; Take 1 tablet (10 mg total) by mouth daily with breakfast.  Dispense: 90 tablet; Refill: 1 - Continuous Glucose Sensor (DEXCOM G7 SENSOR) MISC; Apply 1 sensor to skin every 10 day to monitor glucose for diabetes  Dispense: 3 each; Refill: 11 - CBC with Differential/Platelet - CMP14+EGFR - Lipid Profile - Hgb A1C w/o eAG  2. Hypertension associated with diabetes (HCC) Continue lisinopril and metoprolol as prescribed. Routine labs ordered  - lisinopril (ZESTRIL) 20 MG tablet; Take 1 tablet (20 mg total) by mouth daily. Take one tab po qd  Dispense: 90 tablet; Refill: 1 - CBC with Differential/Platelet -  CMP14+EGFR - Lipid Profile  3. Mixed hyperlipidemia Continue ezetimibe and gemfibrozil as prescribed. Routine labs ordered - gemfibrozil (LOPID) 600 MG tablet; Take 1 tablet (600 mg total) by mouth 2 (two) times daily before a meal.  Dispense: 180 tablet; Refill: 1 - CBC with Differential/Platelet - CMP14+EGFR - Lipid Profile - Hgb A1C w/o eAG  4. Encounter for medication review Medication list reviewed, updated and refills ordered  - gemfibrozil (LOPID) 600 MG tablet; Take 1 tablet (600 mg total) by mouth 2 (two) times daily before a meal.  Dispense: 180 tablet; Refill: 1 - DULoxetine (CYMBALTA) 60 MG capsule; Take 1 capsule (60 mg total) by mouth daily.  Dispense: 90 capsule; Refill: 1 - omeprazole (PRILOSEC) 20 MG capsule; Take 1 capsule (20 mg total) by mouth daily.  Dispense: 90 capsule; Refill: 1 - lisinopril (ZESTRIL) 20 MG tablet; Take 1 tablet (20 mg total) by mouth daily. Take one tab po qd  Dispense: 90 tablet; Refill: 1 - ezetimibe (ZETIA) 10 MG tablet; Take 1 tablet (10 mg total) by mouth daily.  Dispense: 90 tablet; Refill: 1  5. Allergy to statin medication History of allergy to statin medications so patient is taking gemfibrozil and ezetimibe instead, no issues.    General Counseling: Shawn Greene understanding of the findings of todays visit and agrees with plan of treatment. I have discussed any further diagnostic evaluation that may be needed or ordered today. We also reviewed his medications today. he has been encouraged to call the office with any questions or concerns that should arise related to todays visit.    Orders Placed This Encounter  Procedures  . CBC with Differential/Platelet  . CMP14+EGFR  . Lipid Profile  . Hgb A1C w/o eAG  . POCT glycosylated hemoglobin (Hb A1C)    Meds ordered this encounter  Medications  . gemfibrozil (LOPID) 600 MG tablet    Sig: Take 1 tablet (600 mg total) by mouth 2 (two) times daily before a meal.    Dispense:  180  tablet    Refill:  1  . DULoxetine (CYMBALTA) 60 MG capsule    Sig: Take 1 capsule (60 mg total) by mouth daily.    Dispense:  90 capsule    Refill:  1  . omeprazole (PRILOSEC) 20 MG capsule    Sig: Take 1 capsule (20 mg total) by mouth daily.    Dispense:  90 capsule    Refill:  1  . lisinopril (ZESTRIL) 20 MG tablet    Sig: Take 1 tablet (20 mg total) by mouth daily. Take one tab po qd    Dispense:  90 tablet    Refill:  1  . glipiZIDE (GLUCOTROL XL) 10 MG 24 hr tablet    Sig: Take 1 tablet (10 mg total) by mouth daily with breakfast.    Dispense:  90 tablet    Refill:  1  . ezetimibe (ZETIA) 10 MG tablet    Sig: Take 1 tablet (10 mg total) by mouth daily.    Dispense:  90 tablet    Refill:  1  . Continuous Glucose Sensor (DEXCOM G7 SENSOR) MISC    Sig: Apply 1 sensor to skin every 10 day to monitor glucose for diabetes    Dispense:  3 each    Refill:  11    Dx code E11.65    Return for previously scheduled, AWV, Shawn Greene PCP in december.   Total time spent:30 Minutes Time spent includes review of chart, medications, test results, and follow up plan with the patient.   [place] Controlled Substance Database was reviewed by me.  This patient was seen by Shawn Kuster, FNP-C in collaboration with Dr. Beverely Risen as a part of collaborative care agreement.   Shawn Leighton R. Tedd Sias, MSN, FNP-C Internal medicine

## 2022-11-28 ENCOUNTER — Other Ambulatory Visit: Payer: Self-pay | Admitting: Nurse Practitioner

## 2022-11-28 DIAGNOSIS — E1140 Type 2 diabetes mellitus with diabetic neuropathy, unspecified: Secondary | ICD-10-CM

## 2022-11-28 DIAGNOSIS — Z760 Encounter for issue of repeat prescription: Secondary | ICD-10-CM

## 2022-12-26 ENCOUNTER — Other Ambulatory Visit: Payer: Self-pay | Admitting: Urology

## 2023-01-12 ENCOUNTER — Other Ambulatory Visit: Payer: Self-pay | Admitting: Urology

## 2023-01-17 ENCOUNTER — Encounter: Payer: Self-pay | Admitting: Urology

## 2023-01-17 ENCOUNTER — Ambulatory Visit: Payer: Medicare HMO | Admitting: Urology

## 2023-01-17 VITALS — BP 92/62 | HR 80 | Ht 69.0 in | Wt 187.0 lb

## 2023-01-17 DIAGNOSIS — N401 Enlarged prostate with lower urinary tract symptoms: Secondary | ICD-10-CM

## 2023-01-17 MED ORDER — FINASTERIDE 5 MG PO TABS: 5.0000 mg | ORAL_TABLET | Freq: Every day | ORAL | 2 refills | Status: DC

## 2023-01-17 MED ORDER — TAMSULOSIN HCL 0.4 MG PO CAPS: 0.4000 mg | ORAL_CAPSULE | Freq: Two times a day (BID) | ORAL | 0 refills | Status: DC

## 2023-01-17 NOTE — Progress Notes (Signed)
I, Maysun Anabel Bene, acting as a scribe for Riki Altes, MD., have documented all relevant documentation on the behalf of Riki Altes, MD, as directed by Riki Altes, MD while in the presence of Riki Altes, MD.  01/17/2023 11:00 AM   Ernst Bowler Oct 17, 1950 [id number]  Referring provider: Sallyanne Kuster, NP [address] [place],  [place] [address]  Chief Complaint  Patient presents with   Erectile Dysfunction   Urologic history: 1.  BPH with LUTS Tamsulosin 0.4 mg daily   2.  Erectile dysfunction PDE 5 inhibitor refractory Declined second line options; ICI/VED  HPI: Shawn Greene is a 72 y.o. male presents for annual follow-up.   Since last year's visit, he has noted leakage of urine after he completes voiding without associated urgency; otherwise his voiding pattern is stable.  Remains on tamsulosin/finasteride.  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 If you have any questions, ask your nurse or doctor.          aspirin EC 81 MG tablet Take 81 mg by mouth daily. Swallow whole.   blood glucose meter kit and supplies Kit Dispense based on patient  and insurance preference. Use twice daily as directed   Dexcom G7 Sensor Misc Apply 1 sensor to skin every 10 day to monitor glucose for diabetes   DULoxetine 60 MG capsule Commonly known as: CYMBALTA Take 1 capsule (60 mg total) by mouth daily.   ezetimibe 10 MG tablet Commonly known as: ZETIA Take 1 tablet (10 mg total) by mouth daily.   finasteride 5 MG tablet Commonly known as: PROSCAR Take 1 tablet (5 mg total) by mouth daily.   gabapentin 800 MG tablet Commonly known as: NEURONTIN TAKE ONE TABLET BY MOUTH THREE TIMES A DAY   gemfibrozil 600 MG tablet Commonly known as: LOPID Take 1 tablet (600 mg total) by mouth 2 (two) times daily before a meal.   glipiZIDE 10 MG 24 hr tablet Commonly known as: GLUCOTROL XL Take 1 tablet (10 mg total) by mouth daily with breakfast.   glucose blood test strip Use as instructed to test blood sugar twice daily   Lancets 28G Misc by Does not apply route daily.   lisinopril 20 MG tablet Commonly known as: ZESTRIL Take 1 tablet (20 mg total) by mouth daily. Take one tab po qd   metoprolol tartrate 50 MG tablet Commonly known as: LOPRESSOR TAKE 1 TABLET BY MOUTH TWICE A DAY   NEOMYCIN-POLYMYXIN-HYDROCORTISONE 1 % Soln OTIC solution Commonly known as: CORTISPORIN Apply 1-2 drops to  toe BID after soaking   omeprazole 20 MG capsule Commonly known as: PRILOSEC Take 1 capsule (20 mg total) by mouth daily.   pioglitazone 15 MG tablet Commonly known as: ACTOS TAKE 1 TABLET BY MOUTH DAILY   tamsulosin 0.4 MG Caps capsule Commonly known as: FLOMAX TAKE 1 CAPSULE BY MOUTH DAILY What changed: Another medication with the same name was added. Make sure you understand how and when to take each. Changed by: Riki Altes   tamsulosin 0.4 MG Caps capsule Commonly known as: FLOMAX Take 1 capsule (0.4 mg total) by mouth 2 (two) times daily. What changed: You were already taking a medication with the same name, and this prescription was  added. Make sure you understand how and when to take each. Changed by: Riki Altes        Allergies:  Allergies  Allergen Reactions   Pollinex-T [Modified Tree Tyrosine Adsorbate]    Statins Rash    Family History: Family History  Problem Relation Age of Onset   Hypertension Mother     Social History:  reports that he has never smoked. He has never used smokeless tobacco. He reports current alcohol use of about 1.0 standard drink of alcohol per week. He reports that he does not use drugs.   Physical Exam: BP 92/62   Pulse 80   Ht 5\' 9"  (1.753 m)   Wt 187 lb (84.8 kg)   BMI 27.62 kg/m   Constitutional:  Alert and oriented, No acute distress. HEENT: [place] AT Respiratory: Normal respiratory effort, no increased work of breathing. Psychiatric: Normal mood and affect.   Assessment & Plan:    1. BPH with LUTS His urinary leakage after voiding may be anatomic with urine leakage from the bulbar urethra.  He was interested in titrating the tamsulosin to 0.8 mg for 30 days to see if this improves his symptoms.  Finasteride refilled.  1 year follow-up with [facility].   I have reviewed the above documentation for accuracy and completeness, and I agree with the above.   Riki Altes, MD  [facility] [address], [address] [place], [place] [address] [phone]

## 2023-01-30 ENCOUNTER — Telehealth: Payer: Self-pay | Admitting: Nurse Practitioner

## 2023-01-30 NOTE — Telephone Encounter (Signed)
Left vm and sent mychart message to confirm 02/05/23 appointment-Toni

## 2023-01-31 DIAGNOSIS — I10 Essential (primary) hypertension: Secondary | ICD-10-CM | POA: Diagnosis not present

## 2023-01-31 DIAGNOSIS — E1140 Type 2 diabetes mellitus with diabetic neuropathy, unspecified: Secondary | ICD-10-CM | POA: Diagnosis not present

## 2023-01-31 DIAGNOSIS — E782 Mixed hyperlipidemia: Secondary | ICD-10-CM | POA: Diagnosis not present

## 2023-02-01 LAB — CMP14+EGFR
ALT: 23 [IU]/L (ref 0–44)
AST: 23 [IU]/L (ref 0–40)
Albumin: 4.5 g/dL (ref 3.8–4.8)
Alkaline Phosphatase: 54 [IU]/L (ref 44–121)
BUN/Creatinine Ratio: 26 — ABNORMAL HIGH (ref 10–24)
BUN: 30 mg/dL — ABNORMAL HIGH (ref 8–27)
Bilirubin Total: 0.5 mg/dL (ref 0.0–1.2)
CO2: 18 mmol/L — ABNORMAL LOW (ref 20–29)
Calcium: 9.4 mg/dL (ref 8.6–10.2)
Chloride: 104 mmol/L (ref 96–106)
Creatinine, Ser: 1.16 mg/dL (ref 0.76–1.27)
Globulin, Total: 2.4 g/dL (ref 1.5–4.5)
Glucose: 137 mg/dL — ABNORMAL HIGH (ref 70–99)
Potassium: 4.6 mmol/L (ref 3.5–5.2)
Sodium: 140 mmol/L (ref 134–144)
Total Protein: 6.9 g/dL (ref 6.0–8.5)
eGFR: 67 mL/min/{1.73_m2} (ref >59)

## 2023-02-01 LAB — LIPID PANEL
Chol/HDL Ratio: 4.4 {ratio} (ref 0.0–5.0)
Cholesterol, Total: 151 mg/dL (ref 100–199)
HDL: 34 mg/dL — ABNORMAL LOW (ref >39)
LDL Chol Calc (NIH): 90 mg/dL (ref 0–99)
Triglycerides: 152 mg/dL — ABNORMAL HIGH (ref 0–149)
VLDL Cholesterol Cal: 27 mg/dL (ref 5–40)

## 2023-02-01 LAB — CBC WITH DIFFERENTIAL/PLATELET
Basophils Absolute: 0.1 10*3/uL (ref 0.0–0.2)
Basos: 1 %
EOS (ABSOLUTE): 0.3 10*3/uL (ref 0.0–0.4)
Eos: 5 %
Hematocrit: 39.7 % (ref 37.5–51.0)
Hemoglobin: 13.3 g/dL (ref 13.0–17.7)
Immature Grans (Abs): 0 10*3/uL (ref 0.0–0.1)
Immature Granulocytes: 0 %
Lymphocytes Absolute: 2.2 10*3/uL (ref 0.7–3.1)
Lymphs: 35 %
MCH: 30.6 pg (ref 26.6–33.0)
MCHC: 33.5 g/dL (ref 31.5–35.7)
MCV: 91 fL (ref 79–97)
Monocytes Absolute: 0.7 10*3/uL (ref 0.1–0.9)
Monocytes: 12 %
Neutrophils Absolute: 3 10*3/uL (ref 1.4–7.0)
Neutrophils: 47 %
Platelets: 299 10*3/uL (ref 150–450)
RBC: 4.35 x10E6/uL (ref 4.14–5.80)
RDW: 12.3 % (ref 11.6–15.4)
WBC: 6.3 10*3/uL (ref 3.4–10.8)

## 2023-02-01 LAB — HGB A1C W/O EAG: Hgb A1c MFr Bld: 6.5 % — ABNORMAL HIGH (ref 4.8–5.6)

## 2023-02-01 NOTE — Progress Notes (Signed)
Will discuss results at upcoming office visit

## 2023-02-05 ENCOUNTER — Ambulatory Visit (INDEPENDENT_AMBULATORY_CARE_PROVIDER_SITE_OTHER): Payer: Medicare HMO | Admitting: Nurse Practitioner

## 2023-02-05 ENCOUNTER — Encounter: Payer: Self-pay | Admitting: Nurse Practitioner

## 2023-02-05 VITALS — BP 120/74 | HR 69 | Temp 98.1°F | Resp 16 | Ht 69.0 in | Wt 190.0 lb

## 2023-02-05 DIAGNOSIS — K219 Gastro-esophageal reflux disease without esophagitis: Secondary | ICD-10-CM

## 2023-02-05 DIAGNOSIS — E782 Mixed hyperlipidemia: Secondary | ICD-10-CM

## 2023-02-05 DIAGNOSIS — E1169 Type 2 diabetes mellitus with other specified complication: Secondary | ICD-10-CM

## 2023-02-05 DIAGNOSIS — Z0000 Encounter for general adult medical examination without abnormal findings: Secondary | ICD-10-CM

## 2023-02-05 DIAGNOSIS — E1159 Type 2 diabetes mellitus with other circulatory complications: Secondary | ICD-10-CM

## 2023-02-05 DIAGNOSIS — I152 Hypertension secondary to endocrine disorders: Secondary | ICD-10-CM

## 2023-02-05 DIAGNOSIS — E1142 Type 2 diabetes mellitus with diabetic polyneuropathy: Secondary | ICD-10-CM | POA: Diagnosis not present

## 2023-02-05 DIAGNOSIS — Z23 Encounter for immunization: Secondary | ICD-10-CM | POA: Diagnosis not present

## 2023-02-05 DIAGNOSIS — E119 Type 2 diabetes mellitus without complications: Secondary | ICD-10-CM | POA: Insufficient documentation

## 2023-02-05 MED ORDER — LISINOPRIL 20 MG PO TABS: 20.0000 mg | ORAL_TABLET | Freq: Every day | ORAL | 1 refills | Status: DC

## 2023-02-05 MED ORDER — METOPROLOL TARTRATE 50 MG PO TABS: 50.0000 mg | ORAL_TABLET | Freq: Two times a day (BID) | ORAL | 1 refills | Status: DC

## 2023-02-05 MED ORDER — DULOXETINE HCL 60 MG PO CPEP: 60.0000 mg | ORAL_CAPSULE | Freq: Every day | ORAL | 1 refills | Status: DC

## 2023-02-05 MED ORDER — GLIPIZIDE ER 10 MG PO TB24: 10.0000 mg | ORAL_TABLET | Freq: Every day | ORAL | 1 refills | Status: DC

## 2023-02-05 MED ORDER — OMEPRAZOLE 20 MG PO CPDR: 20.0000 mg | DELAYED_RELEASE_CAPSULE | Freq: Every day | ORAL | 1 refills | Status: DC

## 2023-02-05 MED ORDER — PNEUMOCOCCAL 20-VAL CONJ VACC 0.5 ML IM SUSY: 0.5000 mL | PREFILLED_SYRINGE | INTRAMUSCULAR | 0 refills | Status: AC

## 2023-02-05 MED ORDER — GEMFIBROZIL 600 MG PO TABS: 600.0000 mg | ORAL_TABLET | Freq: Two times a day (BID) | ORAL | 1 refills | Status: DC

## 2023-02-05 MED ORDER — GABAPENTIN 800 MG PO TABS: 800.0000 mg | ORAL_TABLET | Freq: Three times a day (TID) | ORAL | 3 refills | Status: DC

## 2023-02-05 MED ORDER — PIOGLITAZONE HCL 15 MG PO TABS: 15.0000 mg | ORAL_TABLET | Freq: Every day | ORAL | 1 refills | Status: DC

## 2023-02-05 MED ORDER — EZETIMIBE 10 MG PO TABS: 10.0000 mg | ORAL_TABLET | Freq: Every day | ORAL | 1 refills | Status: DC

## 2023-02-05 NOTE — Progress Notes (Signed)
[facility] [address] [place], [place] [address]  Internal MEDICINE  Office Visit Note  Patient Name: Shawn Greene  [id number]  [id number]  Date of Service: 02/05/2023  Chief Complaint  Patient presents with   Diabetes   Hyperlipidemia   Hypertension   Medicare Wellness    HPI [name] presents for a medicare annual well visit.  Well-appearing 72 y.o. male with hypertension, diabetes, and high cholesterol  Routine CRC screening: discontinued per GI Eye exam: has an eye doctor, sees them annually in march  foot exam: done, also sees podiatry  Labs: lab results discussed with patient.  --A1c is elevated but stable at 6.5, fasting glucose is elevated --triglycerides are slightly elevated and HDL is low --CBC is normal, metabolic panel is grossly normal.  New or worsening pain: none  Other concerns: spot on left upper eyelid. Not painful or tender, has been there for a couple of months      02/05/2023    8:37 AM 02/02/2022    9:49 AM 01/21/2021    9:57 AM  MMSE - Mini Mental State Exam  Orientation to time 5 5 5   Orientation to Place 5 5 5   Registration 3 3 3   Attention/ Calculation 5 5 5   Recall 3 3 3   Language- name 2 objects 2 2 2   Language- repeat 1 1 1   Language- follow 3 step command 3 3 3   Language- read & follow direction 1 1 1   Write a sentence 0 1 1  Copy design 1 1 1   Total score 29 30 30     Medicare Risk at Home - 02/05/23 0858     Any stairs in or around the home? No    Home free of loose throw rugs in walkways, pet beds, electrical cords, etc? Yes    Adequate lighting in your home to reduce risk of falls? Yes    Life alert? No    Use of a cane, walker or w/c? No    Grab bars in the bathroom? No    Shower chair or bench in shower? Yes    Elevated toilet seat or a handicapped toilet? No              Functional Status Survey: Is the patient deaf or have difficulty hearing?: No Does the patient have difficulty seeing, even when  wearing glasses/contacts?: No Does the patient have difficulty concentrating, remembering, or making decisions?: No Does the patient have difficulty walking or climbing stairs?: Yes Does the patient have difficulty dressing or bathing?: No Does the patient have difficulty doing errands alone such as visiting a doctor's office or shopping?: No     07/21/2021    9:25 AM 10/20/2021    9:19 AM 02/02/2022    9:48 AM 05/05/2022    9:43 AM 02/05/2023    8:36 AM  Fall Risk  Falls in the past year? 1 0 0 0 0  Was there an injury with Fall? 0  0 0 0  Fall Risk Category Calculator 1  0 0 0  Fall Risk Category (Retired) Low  Low    (RETIRED) Patient Fall Risk Level Low fall risk  Low fall risk    Patient at Risk for Falls Due to   No Fall Risks No Fall Risks No Fall Risks  Fall risk Follow up Falls evaluation completed  Falls evaluation completed Falls evaluation completed Falls evaluation completed       02/05/2023    8:36 AM  Depression screen PHQ 2/9  Decreased Interest 0  Down, Depressed, Hopeless 0  PHQ - 2 Score 0        Current Medication: Outpatient Encounter Medications as of 02/05/2023  Medication Sig   aspirin EC 81 MG tablet Take 81 mg by mouth daily. Swallow whole.   blood glucose meter kit and supplies KIT Dispense based on patient and insurance preference. Use twice daily as directed   finasteride (PROSCAR) 5 MG tablet Take 1 tablet (5 mg total) by mouth daily.   glucose blood test strip Use as instructed to test blood sugar twice daily   Lancets 28G MISC by Does not apply route daily.   pneumococcal 20-valent conjugate vaccine (PREVNAR 20) 0.5 ML injection Inject 0.5 mLs into the muscle tomorrow at 10 am for 1 dose.   tamsulosin (FLOMAX) 0.4 MG CAPS capsule Take 1 capsule (0.4 mg total) by mouth 2 (two) times daily.   [DISCONTINUED] Continuous Glucose Sensor (DEXCOM G7 SENSOR) MISC Apply 1 sensor to skin every 10 day to monitor glucose for diabetes   [DISCONTINUED]  DULoxetine (CYMBALTA) 60 MG capsule Take 1 capsule (60 mg total) by mouth daily.   [DISCONTINUED] ezetimibe (ZETIA) 10 MG tablet Take 1 tablet (10 mg total) by mouth daily.   [DISCONTINUED] gabapentin (NEURONTIN) 800 MG tablet TAKE ONE TABLET BY MOUTH THREE TIMES A DAY   [DISCONTINUED] gemfibrozil (LOPID) 600 MG tablet Take 1 tablet (600 mg total) by mouth 2 (two) times daily before a meal.   [DISCONTINUED] glipiZIDE (GLUCOTROL XL) 10 MG 24 hr tablet Take 1 tablet (10 mg total) by mouth daily with breakfast.   [DISCONTINUED] lisinopril (ZESTRIL) 20 MG tablet Take 1 tablet (20 mg total) by mouth daily. Take one tab po qd   [DISCONTINUED] metoprolol tartrate (LOPRESSOR) 50 MG tablet TAKE 1 TABLET BY MOUTH TWICE A DAY   [DISCONTINUED] NEOMYCIN-POLYMYXIN-HYDROCORTISONE (CORTISPORIN) 1 % SOLN OTIC solution Apply 1-2 drops to toe BID after soaking   [DISCONTINUED] omeprazole (PRILOSEC) 20 MG capsule Take 1 capsule (20 mg total) by mouth daily.   [DISCONTINUED] pioglitazone (ACTOS) 15 MG tablet TAKE 1 TABLET BY MOUTH DAILY   [DISCONTINUED] tamsulosin (FLOMAX) 0.4 MG CAPS capsule TAKE 1 CAPSULE BY MOUTH DAILY   DULoxetine (CYMBALTA) 60 MG capsule Take 1 capsule (60 mg total) by mouth daily.   ezetimibe (ZETIA) 10 MG tablet Take 1 tablet (10 mg total) by mouth daily.   gabapentin (NEURONTIN) 800 MG tablet Take 1 tablet (800 mg total) by mouth 3 (three) times daily.   gemfibrozil (LOPID) 600 MG tablet Take 1 tablet (600 mg total) by mouth 2 (two) times daily before a meal.   glipiZIDE (GLUCOTROL XL) 10 MG 24 hr tablet Take 1 tablet (10 mg total) by mouth daily with breakfast.   lisinopril (ZESTRIL) 20 MG tablet Take 1 tablet (20 mg total) by mouth daily. Take one tab po qd   metoprolol tartrate (LOPRESSOR) 50 MG tablet Take 1 tablet (50 mg total) by mouth 2 (two) times daily.   omeprazole (PRILOSEC) 20 MG capsule Take 1 capsule (20 mg total) by mouth daily.   pioglitazone (ACTOS) 15 MG tablet Take 1 tablet  (15 mg total) by mouth daily.   No facility-administered encounter medications on file as of 02/05/2023.    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     Family History: Family History  Problem Relation Age of Onset   Hypertension Mother     Social History   Socioeconomic History   Marital status: Married    Spouse name: Not on file   Number of children: Not on file   Years of education: Not on file   Highest education level: Not on file  Occupational History   Not on file  Tobacco Use   Smoking status: Never   Smokeless tobacco: Never  Substance and Sexual Activity   Alcohol use: Yes    Alcohol/week: 1.0 standard drink of alcohol    Types: 1 Cans of beer per week    Comment: occ   Drug use: No   Sexual activity: Not on file  Other Topics Concern   Not on file  Social History Narrative   Not on file   Social Drivers of Health   Financial Resource Strain: Low Risk  (05/19/2020)   Overall Financial Resource Strain (CARDIA)    Difficulty of Paying Living Expenses: Not hard at all  Food Insecurity: Not on file  Transportation Needs: Not on file  Physical Activity: Not on file  Stress: Not on file  Social Connections: Not on file  Intimate Partner Violence: Not on file      Review of Systems  Constitutional:  Negative for activity change, appetite change, chills, fatigue, fever and unexpected weight change.  HENT: Negative.  Negative for  congestion, ear pain, rhinorrhea, sore throat and trouble swallowing.   Eyes: Negative.   Respiratory: Negative.  Negative for cough, chest tightness, shortness of breath and wheezing.   Cardiovascular: Negative.  Negative for chest pain.  Gastrointestinal: Negative.  Negative for abdominal pain, blood in stool, constipation, diarrhea, nausea and vomiting.  Endocrine: Negative.   Genitourinary: Negative.  Negative for difficulty urinating, dysuria, frequency, hematuria and urgency.  Musculoskeletal: Negative.  Negative for arthralgias, back pain, joint swelling, myalgias and neck pain.  Skin: Negative.  Negative for rash and wound.  Allergic/Immunologic: Negative.  Negative for immunocompromised state.  Neurological: Negative.  Negative for dizziness, seizures, numbness and headaches.  Hematological: Negative.   Psychiatric/Behavioral: Negative.  Negative for behavioral problems, self-injury and suicidal ideas. The patient is not nervous/anxious.     Vital Signs: BP 120/74   Pulse 69   Temp 98.1 F (36.7 C)   Resp 16   Ht 5\' 9"  (1.753 m)   Wt 190 lb (86.2 kg)   SpO2 95%   BMI 28.06 kg/m    Physical Exam Vitals reviewed.  Constitutional:      General: He is awake. He is not in acute distress.    Appearance: Normal appearance. He is well-developed, well-groomed and normal weight. He is not ill-appearing or diaphoretic.  HENT:     Head: Normocephalic and atraumatic.     Mouth/Throat:     Lips: Pink.     Pharynx: Uvula midline.  Eyes:     General: Lids are normal. Vision grossly intact. Gaze aligned appropriately.     Extraocular Movements: Extraocular movements intact.     Conjunctiva/sclera: Conjunctivae normal.  Pupils: Pupils are equal, round, and reactive to light.     Funduscopic exam:    Right eye: Red reflex present.        Left eye: Red reflex present.    Comments: Stye on upper left eyelid  Neck:     Thyroid: No thyromegaly.     Vascular: No JVD.      Trachea: Trachea and phonation normal. No tracheal deviation.  Cardiovascular:     Rate and Rhythm: Normal rate and regular rhythm.     Pulses: Normal pulses.          Dorsalis pedis pulses are 2+ on the right side and 2+ on the left side.       Posterior tibial pulses are 2+ on the right side and 2+ on the left side.     Heart sounds: Normal heart sounds, S1 normal and S2 normal. No murmur heard.    No friction rub. No gallop.  Pulmonary:     Effort: Pulmonary effort is normal. No accessory muscle usage or respiratory distress.     Breath sounds: Normal breath sounds and air entry. No stridor. No wheezing or rales.  Chest:     Chest wall: No tenderness.  Abdominal:     Palpations: There is no shifting dullness, fluid wave or pulsatile mass.  Musculoskeletal:     Right foot: Normal range of motion. No deformity, bunion, Charcot foot, foot drop or prominent metatarsal heads.     Left foot: Normal range of motion. No deformity, bunion, Charcot foot, foot drop or prominent metatarsal heads.  Feet:     Right foot:     Protective Sensation: 6 sites tested.  6 sites sensed.     Skin integrity: Callus and dry skin present. No ulcer, blister, skin breakdown, erythema, warmth or fissure.     Toenail Condition: Right toenails are ingrown.     Left foot:     Protective Sensation: 6 sites tested.  6 sites sensed.     Skin integrity: Callus and dry skin present. No ulcer, blister, skin breakdown, erythema, warmth or fissure.     Toenail Condition: Left toenails are ingrown.  Skin:    Capillary Refill: Capillary refill takes less than 2 seconds.  Neurological:     Mental Status: He is alert and oriented to person, place, and time.     Cranial Nerves: No cranial nerve deficit.     Motor: No abnormal muscle tone.     Coordination: Coordination normal.     Gait: Gait normal.     Deep Tendon Reflexes: Reflexes are normal and symmetric.  Psychiatric:        Mood and Affect: Mood normal.         Behavior: Behavior normal. Behavior is cooperative.        Thought Content: Thought content normal.        Judgment: Judgment normal.        Assessment/Plan: 1. Encounter for subsequent annual wellness visit (AWV) in Medicare patient (Primary) Age-appropriate preventive screenings and vaccinations discussed. Routine labs for health maintenance results reviewed with patient. PHM updated.    2. Type 2 diabetes mellitus with other circulatory complication, without long-term current use of insulin (HCC) A1c is slightly increased but stable, continue medications as prescribed. - gabapentin (NEURONTIN) 800 MG tablet; Take 1 tablet (800 mg total) by mouth 3 (three) times daily.  Dispense: 270 tablet; Refill: 3 - DULoxetine (CYMBALTA) 60 MG capsule; Take 1 capsule (60 mg total)  by mouth daily.  Dispense: 90 capsule; Refill: 1 - gemfibrozil (LOPID) 600 MG tablet; Take 1 tablet (600 mg total) by mouth 2 (two) times daily before a meal.  Dispense: 180 tablet; Refill: 1 - glipiZIDE (GLUCOTROL XL) 10 MG 24 hr tablet; Take 1 tablet (10 mg total) by mouth daily with breakfast.  Dispense: 90 tablet; Refill: 1 - pioglitazone (ACTOS) 15 MG tablet; Take 1 tablet (15 mg total) by mouth daily.  Dispense: 90 tablet; Refill: 1 - Urine Microalbumin w/creat. ratio  3. Hypertension associated with diabetes (HCC) Stable, continue lisinopril and metoprolol as prescribed. - lisinopril (ZESTRIL) 20 MG tablet; Take 1 tablet (20 mg total) by mouth daily. Take one tab po qd  Dispense: 90 tablet; Refill: 1 - metoprolol tartrate (LOPRESSOR) 50 MG tablet; Take 1 tablet (50 mg total) by mouth 2 (two) times daily.  Dispense: 180 tablet; Refill: 1  4. Mixed hyperlipidemia due to type 2 diabetes mellitus (HCC) Continue gemfibrozil and ezetimibe as prescribed.  - ezetimibe (ZETIA) 10 MG tablet; Take 1 tablet (10 mg total) by mouth daily.  Dispense: 90 tablet; Refill: 1 - gemfibrozil (LOPID) 600 MG tablet; Take 1 tablet (600 mg  total) by mouth 2 (two) times daily before a meal.  Dispense: 180 tablet; Refill: 1  5. Diabetic polyneuropathy associated with type 2 diabetes mellitus (HCC) Continue gabapentin and duloxetine as prescribed  - gabapentin (NEURONTIN) 800 MG tablet; Take 1 tablet (800 mg total) by mouth 3 (three) times daily.  Dispense: 270 tablet; Refill: 3 - DULoxetine (CYMBALTA) 60 MG capsule; Take 1 capsule (60 mg total) by mouth daily.  Dispense: 90 capsule; Refill: 1  6. Gastroesophageal reflux disease without esophagitis Continue omeprazole as prescribed.  - omeprazole (PRILOSEC) 20 MG capsule; Take 1 capsule (20 mg total) by mouth daily.  Dispense: 90 capsule; Refill: 1  7. Need for vaccination - pneumococcal 20-valent conjugate vaccine (PREVNAR 20) 0.5 ML injection; Inject 0.5 mLs into the muscle tomorrow at 10 am for 1 dose.  Dispense: 0.5 mL; Refill: 0      General Counseling: Kaiya verbalizes understanding of the findings of todays visit and agrees with plan of treatment. I have discussed any further diagnostic evaluation that may be needed or ordered today. We also reviewed his medications today. he has been encouraged to call the office with any questions or concerns that should arise related to todays visit.    No orders of the defined types were placed in this encounter.   Meds ordered this encounter  Medications   gabapentin (NEURONTIN) 800 MG tablet    Sig: Take 1 tablet (800 mg total) by mouth 3 (three) times daily.    Dispense:  270 tablet    Refill:  3   ezetimibe (ZETIA) 10 MG tablet    Sig: Take 1 tablet (10 mg total) by mouth daily.    Dispense:  90 tablet    Refill:  1   DULoxetine (CYMBALTA) 60 MG capsule    Sig: Take 1 capsule (60 mg total) by mouth daily.    Dispense:  90 capsule    Refill:  1   gemfibrozil (LOPID) 600 MG tablet    Sig: Take 1 tablet (600 mg total) by mouth 2 (two) times daily before a meal.    Dispense:  180 tablet    Refill:  1   glipiZIDE  (GLUCOTROL XL) 10 MG 24 hr tablet    Sig: Take 1 tablet (10 mg total) by mouth daily with  breakfast.    Dispense:  90 tablet    Refill:  1   lisinopril (ZESTRIL) 20 MG tablet    Sig: Take 1 tablet (20 mg total) by mouth daily. Take one tab po qd    Dispense:  90 tablet    Refill:  1   metoprolol tartrate (LOPRESSOR) 50 MG tablet    Sig: Take 1 tablet (50 mg total) by mouth 2 (two) times daily.    Dispense:  180 tablet    Refill:  1   omeprazole (PRILOSEC) 20 MG capsule    Sig: Take 1 capsule (20 mg total) by mouth daily.    Dispense:  90 capsule    Refill:  1   pioglitazone (ACTOS) 15 MG tablet    Sig: Take 1 tablet (15 mg total) by mouth daily.    Dispense:  90 tablet    Refill:  1   pneumococcal 20-valent conjugate vaccine (PREVNAR 20) 0.5 ML injection    Sig: Inject 0.5 mLs into the muscle tomorrow at 10 am for 1 dose.    Dispense:  0.5 mL    Refill:  0    Return in about 6 months (around 08/06/2023) for F/U, Recheck A1C, Courteney Alderete PCP.   Total time spent:30 Minutes Time spent includes review of chart, medications, test results, and follow up plan with the patient.   [place] Controlled Substance Database was reviewed by me.  This patient was seen by Sallyanne Kuster, FNP-C in collaboration with Dr. Beverely Risen as a part of collaborative care agreement.  Dwight Burdo R. Tedd Sias, MSN, FNP-C Internal medicine

## 2023-02-06 LAB — MICROALBUMIN / CREATININE URINE RATIO
Creatinine, Urine: 150.5 mg/dL
Microalb/Creat Ratio: 3 mg/g{creat} (ref 0–29)
Microalbumin, Urine: 4.9 ug/mL

## 2023-02-08 NOTE — Progress Notes (Signed)
Normal microalbumin/creatinine ratio, no microalbuminuria

## 2023-06-18 ENCOUNTER — Other Ambulatory Visit: Payer: Self-pay

## 2023-06-18 DIAGNOSIS — I152 Hypertension secondary to endocrine disorders: Secondary | ICD-10-CM

## 2023-06-18 MED ORDER — LISINOPRIL 20 MG PO TABS: 20.0000 mg | ORAL_TABLET | Freq: Every day | ORAL | 1 refills | Status: DC

## 2023-06-25 DIAGNOSIS — E113393 Type 2 diabetes mellitus with moderate nonproliferative diabetic retinopathy without macular edema, bilateral: Secondary | ICD-10-CM | POA: Diagnosis not present

## 2023-07-03 DIAGNOSIS — H9313 Tinnitus, bilateral: Secondary | ICD-10-CM | POA: Diagnosis not present

## 2023-07-03 DIAGNOSIS — H6123 Impacted cerumen, bilateral: Secondary | ICD-10-CM | POA: Diagnosis not present

## 2023-07-03 DIAGNOSIS — H903 Sensorineural hearing loss, bilateral: Secondary | ICD-10-CM | POA: Diagnosis not present

## 2023-08-06 ENCOUNTER — Ambulatory Visit (INDEPENDENT_AMBULATORY_CARE_PROVIDER_SITE_OTHER): Payer: Medicare HMO | Admitting: Nurse Practitioner

## 2023-08-06 ENCOUNTER — Encounter: Payer: Self-pay | Admitting: Nurse Practitioner

## 2023-08-06 VITALS — BP 124/68 | HR 74 | Temp 98.1°F | Resp 16 | Ht 69.0 in | Wt 188.0 lb

## 2023-08-06 DIAGNOSIS — I152 Hypertension secondary to endocrine disorders: Secondary | ICD-10-CM

## 2023-08-06 DIAGNOSIS — E1159 Type 2 diabetes mellitus with other circulatory complications: Secondary | ICD-10-CM

## 2023-08-06 DIAGNOSIS — K219 Gastro-esophageal reflux disease without esophagitis: Secondary | ICD-10-CM | POA: Diagnosis not present

## 2023-08-06 DIAGNOSIS — E1169 Type 2 diabetes mellitus with other specified complication: Secondary | ICD-10-CM | POA: Diagnosis not present

## 2023-08-06 DIAGNOSIS — E782 Mixed hyperlipidemia: Secondary | ICD-10-CM

## 2023-08-06 DIAGNOSIS — E1142 Type 2 diabetes mellitus with diabetic polyneuropathy: Secondary | ICD-10-CM | POA: Diagnosis not present

## 2023-08-06 LAB — POCT GLYCOSYLATED HEMOGLOBIN (HGB A1C): Hemoglobin A1C: 6 % — AB (ref 4.0–5.6)

## 2023-08-06 MED ORDER — METOPROLOL TARTRATE 50 MG PO TABS: 50.0000 mg | ORAL_TABLET | Freq: Two times a day (BID) | ORAL | 1 refills | Status: DC

## 2023-08-06 MED ORDER — PIOGLITAZONE HCL 15 MG PO TABS: 15.0000 mg | ORAL_TABLET | Freq: Every day | ORAL | 1 refills | Status: DC

## 2023-08-06 MED ORDER — GLIPIZIDE ER 10 MG PO TB24: 10.0000 mg | ORAL_TABLET | Freq: Every day | ORAL | 1 refills | Status: DC

## 2023-08-06 MED ORDER — OMEPRAZOLE 20 MG PO CPDR: 20.0000 mg | DELAYED_RELEASE_CAPSULE | Freq: Every day | ORAL | 1 refills | Status: DC

## 2023-08-06 MED ORDER — DULOXETINE HCL 60 MG PO CPEP: 60.0000 mg | ORAL_CAPSULE | Freq: Every day | ORAL | 1 refills | Status: DC

## 2023-08-06 MED ORDER — GEMFIBROZIL 600 MG PO TABS: 600.0000 mg | ORAL_TABLET | Freq: Two times a day (BID) | ORAL | 1 refills | Status: DC

## 2023-08-06 MED ORDER — ACCU-CHEK GUIDE TEST VI STRP: ORAL_STRIP | 12 refills | Status: AC

## 2023-08-06 MED ORDER — LISINOPRIL 20 MG PO TABS: 20.0000 mg | ORAL_TABLET | Freq: Every day | ORAL | 1 refills | Status: DC

## 2023-08-06 MED ORDER — EZETIMIBE 10 MG PO TABS: 10.0000 mg | ORAL_TABLET | Freq: Every day | ORAL | 1 refills | Status: AC

## 2023-08-06 MED ORDER — ACCU-CHEK SOFTCLIX LANCETS MISC: 12 refills | Status: AC

## 2023-08-06 NOTE — Progress Notes (Unsigned)
 [facility] [address] [place], [place] [address]  Internal MEDICINE  Office Visit Note  Patient Name: Shawn Greene  [id number]  [id number]  Date of Service: 08/06/2023  Chief Complaint  Patient presents with   Diabetes   Hypertension   Hyperlipidemia   Follow-up    HPI Shawn Greene presents for a follow-up visit for diabetes, hypertension, high cholesterol and refills.  Diabetes -- A1c is improved to 6.0 Weight is stable at this time Hypertension -- blood pressure is controled on lisinopril  and metoprolol  High cholelsterol -- taking gemfibrozil  and zetia  GERD -- controlled with omeprazole .     Current Medication: Outpatient Encounter Medications as of 08/06/2023  Medication Sig   Accu-Chek Softclix Lancets lancets Use 1 lancet to check glucose once daily and as needed for diabetes   glucose blood (ACCU-CHEK GUIDE TEST) test strip Use 1 test strip to check glucose daily as needed for diabetes.   aspirin  EC 81 MG tablet Take 81 mg by mouth daily. Swallow whole.   blood glucose meter kit and supplies KIT Dispense based on patient and insurance preference. Use twice daily as directed   DULoxetine  (CYMBALTA ) 60 MG capsule Take 1 capsule (60 mg total) by mouth daily.   ezetimibe  (ZETIA ) 10 MG tablet Take 1 tablet (10 mg total) by mouth daily.   finasteride  (PROSCAR ) 5 MG tablet Take 1 tablet (5 mg total) by mouth daily.   gabapentin  (NEURONTIN ) 800 MG tablet Take 1 tablet (800 mg total) by mouth 3 (three) times daily.   gemfibrozil  (LOPID ) 600 MG tablet Take 1 tablet (600 mg total) by mouth 2 (two) times daily before a meal.   glipiZIDE  (GLUCOTROL  XL) 10 MG 24 hr tablet Take 1 tablet (10 mg total) by mouth daily with breakfast.   lisinopril  (ZESTRIL ) 20 MG tablet Take 1 tablet (20 mg total) by mouth daily. Take one tab po qd   metoprolol  tartrate (LOPRESSOR ) 50 MG tablet Take 1 tablet (50 mg total) by mouth 2 (two) times daily.   omeprazole  (PRILOSEC) 20 MG capsule Take 1  capsule (20 mg total) by mouth daily.   pioglitazone  (ACTOS ) 15 MG tablet Take 1 tablet (15 mg total) by mouth daily.   tamsulosin  (FLOMAX ) 0.4 MG CAPS capsule Take 1 capsule (0.4 mg total) by mouth 2 (two) times daily.   [DISCONTINUED] DULoxetine  (CYMBALTA ) 60 MG capsule Take 1 capsule (60 mg total) by mouth daily.   [DISCONTINUED] ezetimibe  (ZETIA ) 10 MG tablet Take 1 tablet (10 mg total) by mouth daily.   [DISCONTINUED] gemfibrozil  (LOPID ) 600 MG tablet Take 1 tablet (600 mg total) by mouth 2 (two) times daily before a meal.   [DISCONTINUED] glipiZIDE  (GLUCOTROL  XL) 10 MG 24 hr tablet Take 1 tablet (10 mg total) by mouth daily with breakfast.   [DISCONTINUED] glucose blood test strip Use as instructed to test blood sugar twice daily   [DISCONTINUED] Lancets 28G MISC by Does not apply route daily.   [DISCONTINUED] lisinopril  (ZESTRIL ) 20 MG tablet Take 1 tablet (20 mg total) by mouth daily. Take one tab po qd   [DISCONTINUED] metoprolol  tartrate (LOPRESSOR ) 50 MG tablet Take 1 tablet (50 mg total) by mouth 2 (two) times daily.   [DISCONTINUED] omeprazole  (PRILOSEC) 20 MG capsule Take 1 capsule (20 mg total) by mouth daily.   [DISCONTINUED] pioglitazone  (ACTOS ) 15 MG tablet Take 1 tablet (15 mg total) by mouth daily.   No facility-administered encounter medications on file as of 08/06/2023.    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     Family History: Family  History  Problem Relation Age of Onset   Hypertension Mother     Social History   Socioeconomic History   Marital status: Married    Spouse name: Not on file   Number of children: Not on file   Years of education: Not on file   Highest education level: Not on file  Occupational History   Not on file  Tobacco Use   Smoking status: Never   Smokeless tobacco: Never  Substance and Sexual Activity   Alcohol use: Yes    Alcohol/week: 1.0 standard drink of alcohol    Types: 1 Cans of beer per week    Comment: occ   Drug use: No   Sexual activity: Not on file  Other Topics Concern   Not on file  Social History Narrative   Not on file   Social Drivers of Health   Financial Resource Strain: Low Risk  (05/19/2020)   Overall Financial Resource Strain (CARDIA)    Difficulty of Paying Living Expenses: Not hard at all  Food Insecurity: Not on file  Transportation Needs: Not on file  Physical Activity: Not on file  Stress: Not on file  Social Connections: Not on file  Intimate Partner Violence: Not on file      Review of Systems  Constitutional:  Negative for chills, fatigue and unexpected weight change.  HENT:  Negative for congestion, rhinorrhea, sneezing and sore throat.   Eyes:  Negative for redness.  Respiratory:  Negative for cough, chest tightness and shortness of breath.   Cardiovascular:  Negative for chest pain and palpitations.  Gastrointestinal:  Negative for abdominal pain, constipation, diarrhea, nausea and vomiting.  Genitourinary:  Negative for dysuria and frequency.  Musculoskeletal:  Negative for arthralgias, back pain, joint swelling and neck pain.  Skin:  Negative for rash.  Neurological: Negative.  Negative for tremors and numbness.  Hematological:  Negative for adenopathy. Does not bruise/bleed easily.  Psychiatric/Behavioral:  Negative for behavioral problems (Depression), sleep disturbance and suicidal ideas. The patient is not nervous/anxious.      Vital Signs: BP 124/68   Pulse 74   Temp 98.1 F (36.7 C)   Resp 16   Ht 5' 9 (1.753 m)   Wt 188 lb (85.3 kg)   SpO2 95%   BMI 27.76 kg/m    Physical Exam Vitals reviewed.  Constitutional:      General: He is not in acute distress.    Appearance: Normal appearance. He is not ill-appearing.  HENT:     Head: Normocephalic and atraumatic.   Eyes:     Pupils: Pupils are equal, round, and reactive to light.    Cardiovascular:     Rate and Rhythm: Normal rate and regular rhythm.  Pulmonary:     Effort: Pulmonary effort is normal. No respiratory distress.   Neurological:     Mental Status: He is alert  and oriented to person, place, and time.   Psychiatric:        Mood and Affect: Mood normal.        Behavior: Behavior normal.        Assessment/Plan: 1. Type 2 diabetes mellitus with other circulatory complication, without long-term current use of insulin  (HCC) (Primary) A1c is stable and improved at 6.0 today. Continue medications as prescribed. Check fasting glucose at least a few times a week.  - POCT glycosylated hemoglobin (Hb A1C) - glipiZIDE  (GLUCOTROL  XL) 10 MG 24 hr tablet; Take 1 tablet (10 mg total) by mouth daily with breakfast.  Dispense: 90 tablet; Refill: 1 - gemfibrozil  (LOPID ) 600 MG tablet; Take 1 tablet (600 mg total) by mouth 2 (two) times daily before a meal.  Dispense: 180 tablet; Refill: 1 - pioglitazone  (ACTOS ) 15 MG tablet; Take 1 tablet (15 mg total) by mouth daily.  Dispense: 90 tablet; Refill: 1 - Accu-Chek Softclix Lancets lancets; Use 1 lancet to check glucose once daily and as needed for diabetes  Dispense: 100 each; Refill: 12 - glucose blood (ACCU-CHEK GUIDE TEST) test strip; Use 1 test strip to check glucose daily as needed for diabetes.  Dispense: 100 each; Refill: 12  2. Hypertension associated with diabetes (HCC) Stable, Continue lisinopril  and metoprolol  as prescribed.  - lisinopril  (ZESTRIL ) 20 MG tablet; Take 1 tablet (20 mg  total) by mouth daily. Take one tab po qd  Dispense: 90 tablet; Refill: 1 - metoprolol  tartrate (LOPRESSOR ) 50 MG tablet; Take 1 tablet (50 mg total) by mouth 2 (two) times daily.  Dispense: 180 tablet; Refill: 1  3. Mixed hyperlipidemia due to type 2 diabetes mellitus (HCC) Continue gemfibrozil  and zetia  as prescribed.  - ezetimibe  (ZETIA ) 10 MG tablet; Take 1 tablet (10 mg total) by mouth daily.  Dispense: 90 tablet; Refill: 1 - gemfibrozil  (LOPID ) 600 MG tablet; Take 1 tablet (600 mg total) by mouth 2 (two) times daily before a meal.  Dispense: 180 tablet; Refill: 1  4. Diabetic polyneuropathy associated with type 2 diabetes mellitus (HCC) Continue duloxetine  as prescribed.  - DULoxetine  (CYMBALTA ) 60 MG capsule; Take 1 capsule (60 mg total) by mouth daily.  Dispense: 90 capsule; Refill: 1  5. Gastroesophageal reflux disease without esophagitis Continue omeprazole  as prescribed.  - omeprazole  (PRILOSEC) 20 MG capsule; Take 1 capsule (20 mg total) by mouth daily.  Dispense: 90 capsule; Refill: 1   General Counseling: Shawn Greene understanding of the findings of todays visit and agrees with plan of treatment. I have discussed any further diagnostic evaluation that may be needed or ordered today. We also reviewed his medications today. he has been encouraged to call the office with any questions or concerns that should arise related to todays visit.    Orders Placed This Encounter  Procedures   POCT glycosylated hemoglobin (Hb A1C)    Meds ordered this encounter  Medications   ezetimibe  (ZETIA ) 10 MG tablet    Sig: Take 1 tablet (10 mg total) by mouth daily.    Dispense:  90 tablet    Refill:  1   DULoxetine  (CYMBALTA ) 60 MG capsule    Sig: Take 1 capsule (60 mg total) by mouth daily.    Dispense:  90 capsule    Refill:  1   glipiZIDE  (GLUCOTROL  XL) 10 MG 24 hr tablet    Sig: Take 1 tablet (10 mg total) by mouth daily with breakfast.    Dispense:  90 tablet    Refill:  1    gemfibrozil  (LOPID ) 600 MG tablet    Sig: Take 1 tablet (600 mg total) by mouth 2 (two) times daily before a meal.    Dispense:  180 tablet    Refill:  1   lisinopril  (ZESTRIL ) 20 MG tablet    Sig: Take 1 tablet (20 mg total) by mouth daily. Take one tab po qd    Dispense:  90 tablet    Refill:  1   metoprolol  tartrate (LOPRESSOR ) 50 MG tablet    Sig: Take 1 tablet (50 mg total) by mouth 2 (two) times daily.    Dispense:  180 tablet    Refill:  1   omeprazole  (PRILOSEC) 20 MG capsule    Sig: Take 1 capsule (20 mg total) by mouth daily.    Dispense:  90 capsule    Refill:  1   pioglitazone  (ACTOS ) 15 MG tablet    Sig: Take 1 tablet (15 mg total) by mouth daily.    Dispense:  90 tablet    Refill:  1   Accu-Chek Softclix Lancets lancets    Sig: Use 1 lancet to check glucose once daily and as needed for diabetes    Dispense:  100 each    Refill:  12    Dx code E11.65   glucose blood (ACCU-CHEK GUIDE TEST) test strip    Sig: Use 1 test strip to check glucose daily as needed for diabetes.    Dispense:  100 each    Refill:  12    Dx code E11.65    Return in about 4 months (around 12/06/2023) for F/U, Recheck A1C, Shawn Greene PCP.   Total time spent:30 Minutes Time spent includes review of chart, medications, test results, and follow up plan with the patient.   [place] Controlled Substance Database was reviewed by me.  This patient was seen by Laurence Pons, FNP-C in collaboration with Dr. Verneta Gone as a part of collaborative care agreement.   Romain Erion R. Bobbi Burow, MSN, FNP-C Internal medicine

## 2023-08-09 ENCOUNTER — Encounter: Payer: Self-pay | Admitting: Nurse Practitioner

## 2023-09-19 ENCOUNTER — Encounter: Payer: Self-pay | Admitting: Urology

## 2023-10-17 DIAGNOSIS — L570 Actinic keratosis: Secondary | ICD-10-CM | POA: Diagnosis not present

## 2023-10-17 DIAGNOSIS — D2261 Melanocytic nevi of right upper limb, including shoulder: Secondary | ICD-10-CM | POA: Diagnosis not present

## 2023-10-17 DIAGNOSIS — D2262 Melanocytic nevi of left upper limb, including shoulder: Secondary | ICD-10-CM | POA: Diagnosis not present

## 2023-10-17 DIAGNOSIS — D225 Melanocytic nevi of trunk: Secondary | ICD-10-CM | POA: Diagnosis not present

## 2023-10-17 DIAGNOSIS — D2272 Melanocytic nevi of left lower limb, including hip: Secondary | ICD-10-CM | POA: Diagnosis not present

## 2023-10-17 DIAGNOSIS — D2271 Melanocytic nevi of right lower limb, including hip: Secondary | ICD-10-CM | POA: Diagnosis not present

## 2023-10-17 DIAGNOSIS — L821 Other seborrheic keratosis: Secondary | ICD-10-CM | POA: Diagnosis not present

## 2023-11-23 NOTE — Progress Notes (Signed)
 Shawn Greene                                          MRN: [id number]   11/23/2023   The [facility] Quality Team Specialist reviewed this patient medical record for the purposes of chart review for care gap closure. The following were reviewed: chart review for care gap closure-kidney health evaluation for diabetes:eGFR  and uACR.    [facility] Quality Team

## 2023-12-04 ENCOUNTER — Telehealth: Payer: Self-pay | Admitting: Nurse Practitioner

## 2023-12-04 NOTE — Telephone Encounter (Signed)
 Lvm to see if okay to see dfk on 12/06/23 appt-Toni

## 2023-12-06 ENCOUNTER — Ambulatory Visit (INDEPENDENT_AMBULATORY_CARE_PROVIDER_SITE_OTHER): Admitting: Internal Medicine

## 2023-12-06 ENCOUNTER — Encounter: Payer: Self-pay | Admitting: Internal Medicine

## 2023-12-06 VITALS — BP 110/68 | HR 71 | Temp 96.5°F | Resp 16 | Ht 69.0 in | Wt 186.0 lb

## 2023-12-06 DIAGNOSIS — E1159 Type 2 diabetes mellitus with other circulatory complications: Secondary | ICD-10-CM

## 2023-12-06 DIAGNOSIS — E782 Mixed hyperlipidemia: Secondary | ICD-10-CM | POA: Diagnosis not present

## 2023-12-06 DIAGNOSIS — I152 Hypertension secondary to endocrine disorders: Secondary | ICD-10-CM

## 2023-12-06 DIAGNOSIS — R350 Frequency of micturition: Secondary | ICD-10-CM

## 2023-12-06 DIAGNOSIS — E1169 Type 2 diabetes mellitus with other specified complication: Secondary | ICD-10-CM

## 2023-12-06 DIAGNOSIS — N401 Enlarged prostate with lower urinary tract symptoms: Secondary | ICD-10-CM

## 2023-12-06 LAB — POCT GLYCOSYLATED HEMOGLOBIN (HGB A1C): Hemoglobin A1C: 6 % — AB (ref 4.0–5.6)

## 2023-12-06 MED ORDER — ROSUVASTATIN CALCIUM 5 MG PO TABS: ORAL_TABLET | ORAL | 1 refills | Status: DC

## 2023-12-06 NOTE — Progress Notes (Signed)
 [facility] [address] [place], [place] [address]  Internal MEDICINE  Office Visit Note  Patient Name: Shawn Greene  [id number]  [id number]  Date of Service: 12/06/2023  Chief Complaint  Patient presents with   Diabetes   Hypertension   Hyperlipidemia   Follow-up    HPI Patient is seen for routine follow-up Patient is a diabetic and is on Actos  and glipizide , hemoglobin A1c is at target Patient also has neuropathy and has been seen by a specialist Patient has not been on a statin but does take gemfibrozil  He is requesting a lab order for upcoming physical, he is also followed by urology    Current Medication: Outpatient Encounter Medications as of 12/06/2023  Medication Sig   rosuvastatin (CRESTOR) 5 MG tablet Take one tab twice a week   Accu-Chek Softclix Lancets lancets Use 1 lancet to check glucose once daily and as needed for diabetes   aspirin  EC 81 MG tablet Take 81 mg by mouth daily. Swallow whole.   blood glucose meter kit and supplies KIT Dispense based on patient and insurance preference. Use twice daily as directed   DULoxetine  (CYMBALTA ) 60 MG capsule Take 1 capsule (60 mg total) by mouth daily.   ezetimibe  (ZETIA ) 10 MG tablet Take 1 tablet (10 mg total) by mouth daily.   finasteride  (PROSCAR ) 5 MG tablet Take 1 tablet (5 mg total) by mouth daily.   gabapentin  (NEURONTIN ) 800 MG tablet Take 1 tablet (800 mg total) by mouth 3 (three) times daily.   gemfibrozil  (LOPID ) 600 MG tablet Take 1 tablet (600 mg total) by mouth 2 (two) times daily before a meal.   glipiZIDE  (GLUCOTROL  XL) 10 MG 24 hr tablet Take 1 tablet (10 mg total) by mouth daily with breakfast.   glucose blood (ACCU-CHEK GUIDE TEST) test strip Use 1 test strip to check glucose daily as needed for diabetes.   lisinopril  (ZESTRIL ) 20 MG tablet Take 1 tablet (20 mg total) by mouth daily. Take one tab po qd   metoprolol  tartrate (LOPRESSOR ) 50 MG tablet Take 1 tablet (50 mg total) by mouth  2 (two) times daily.   omeprazole  (PRILOSEC) 20 MG capsule Take 1 capsule (20 mg total) by mouth daily.   pioglitazone  (ACTOS ) 15 MG tablet Take 1 tablet (15 mg total) by mouth daily.   tamsulosin  (FLOMAX ) 0.4 MG CAPS capsule Take 1 capsule (0.4 mg total) by mouth 2 (two) times daily.   No facility-administered encounter medications on file as of 12/06/2023.    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     Family History: Family History  Problem Relation Age of Onset   Hypertension Mother     Social History   Socioeconomic History   Marital status: Married    Spouse name: Not on file   Number of children: Not on file   Years of education: Not on file   Highest education level: Not on file  Occupational History   Not on file  Tobacco Use   Smoking status: Never   Smokeless tobacco: Never  Substance and Sexual Activity   Alcohol use: Yes    Alcohol/week: 1.0 standard drink of alcohol    Types: 1 Cans of beer per week    Comment: occ   Drug use: No   Sexual activity: Not on file  Other Topics Concern   Not on file  Social History Narrative   Not on file   Social Drivers of Health   Financial Resource Strain: Low Risk  (05/19/2020)   Overall Financial Resource Strain (CARDIA)    Difficulty of Paying Living Expenses: Not hard at all  Food Insecurity: Not on file  Transportation Needs: Not on file  Physical Activity: Not on file  Stress: Not on  file  Social Connections: Not on file  Intimate Partner Violence: Not on file      Review of Systems  Constitutional:  Negative for fatigue and fever.  HENT:  Negative for congestion, mouth sores and postnasal drip.   Respiratory:  Negative for cough.   Cardiovascular:  Negative for chest pain.  Genitourinary:  Negative for flank pain.  Psychiatric/Behavioral: Negative.      Vital Signs: BP 110/68   Pulse 71   Temp (!) 96.5 F (35.8 C)   Resp 16   Ht 5' 9 (1.753 m)   Wt 186 lb (84.4 kg)   SpO2 95%   BMI 27.47 kg/m    Physical Exam Constitutional:      Appearance: Normal appearance.  HENT:     Head: Normocephalic and atraumatic.     Nose: Nose normal.     Mouth/Throat:     Mouth: Mucous membranes are moist.     Pharynx: No posterior oropharyngeal erythema.  Eyes:     Extraocular Movements: Extraocular movements intact.     Pupils: Pupils are equal, round, and reactive to light.  Cardiovascular:     Pulses: Normal pulses.     Heart sounds: Normal heart sounds.  Pulmonary:     Effort: Pulmonary effort is normal.     Breath sounds: Normal breath sounds.  Neurological:     General: No focal deficit present.     Mental Status: He is alert.  Psychiatric:        Mood and Affect: Mood normal.        Behavior: Behavior normal.        Assessment/Plan: 1. Type 2 diabetes mellitus with other circulatory complication, without long-term current use of insulin  (HCC) (Primary) Patient had explained to diabetic control however he is diabetic treatment can be modified due to better profile of the newer class of antidiabetic's like the S GLP-1 Will DC his Actos , and reduce glipizide  to 5 mg once a day. Samples of Farxiga 5 mg for 1 week and then increase to 10 mg once a day patient is to monitor his blood sugars at home, might need low-dose Rybelsus as well in future - Urine Microalbumin w/creat. ratio - Hemoglobin A1c  2. Hypertension associated with diabetes  (HCC) Controlled with medications - CBC with Differential/Platelet - Comprehensive metabolic panel with GFR  3. Mixed hyperlipidemia due to type 2 diabetes mellitus (HCC) Will start him on low-dose Crestor - Lipid Panel With LDL/HDL Ratio - Comprehensive metabolic panel with GFR - rosuvastatin (CRESTOR) 5 MG tablet; Take one tab twice a week  Dispense: 24 tablet; Refill: 1  4. Benign prostatic hyperplasia with urinary frequency Followed  by urology - PSA Total (Reflex To Free)     General Counseling: montague corella understanding of the findings of todays visit and agrees with plan of treatment. I have discussed any further diagnostic evaluation that may be needed or ordered today. We also reviewed his medications today. he has been encouraged to call the office with any questions or concerns that should arise related to todays visit.    Orders Placed This Encounter  Procedures   CBC with Differential/Platelet   Lipid Panel With LDL/HDL Ratio   TSH   T4, free   Comprehensive metabolic panel with GFR   Urine Microalbumin w/creat. ratio   Hemoglobin A1c   PSA Total (Reflex To Free)   POCT glycosylated hemoglobin (Hb A1C)    Meds ordered this encounter  Medications   rosuvastatin (CRESTOR) 5 MG tablet    Sig: Take one tab twice a week    Dispense:  24 tablet    Refill:  1    Total time spent:30 Minutes Time spent includes review of chart, medications, test results, and follow up plan with the patient.   [place] Controlled Substance Database was reviewed by me.   Dr Todrick Siedschlag M Marvell Tamer Internal medicine

## 2023-12-10 ENCOUNTER — Other Ambulatory Visit: Payer: Self-pay

## 2023-12-10 DIAGNOSIS — E11649 Type 2 diabetes mellitus with hypoglycemia without coma: Secondary | ICD-10-CM

## 2023-12-10 MED ORDER — ACCU-CHEK GUIDE ME W/DEVICE KIT: PACK | 0 refills | Status: AC

## 2023-12-14 ENCOUNTER — Other Ambulatory Visit: Payer: Self-pay

## 2023-12-14 DIAGNOSIS — I152 Hypertension secondary to endocrine disorders: Secondary | ICD-10-CM

## 2023-12-14 MED ORDER — LISINOPRIL 20 MG PO TABS: 20.0000 mg | ORAL_TABLET | Freq: Every day | ORAL | 1 refills | Status: AC

## 2023-12-19 ENCOUNTER — Other Ambulatory Visit: Payer: Self-pay | Admitting: Urology

## 2023-12-25 ENCOUNTER — Ambulatory Visit: Admitting: Internal Medicine

## 2023-12-27 LAB — CBC WITH DIFFERENTIAL/PLATELET
Basophils Absolute: 0.1 x10E3/uL (ref 0.0–0.2)
Basos: 1 %
EOS (ABSOLUTE): 0.3 x10E3/uL (ref 0.0–0.4)
Eos: 5 %
Hematocrit: 43.2 % (ref 37.5–51.0)
Hemoglobin: 14.6 g/dL (ref 13.0–17.7)
Immature Grans (Abs): 0 x10E3/uL (ref 0.0–0.1)
Immature Granulocytes: 0 %
Lymphocytes Absolute: 2.2 x10E3/uL (ref 0.7–3.1)
Lymphs: 39 %
MCH: 30.2 pg (ref 26.6–33.0)
MCHC: 33.8 g/dL (ref 31.5–35.7)
MCV: 89 fL (ref 79–97)
Monocytes Absolute: 0.6 x10E3/uL (ref 0.1–0.9)
Monocytes: 11 %
Neutrophils Absolute: 2.5 x10E3/uL (ref 1.4–7.0)
Neutrophils: 44 %
Platelets: 321 x10E3/uL (ref 150–450)
RBC: 4.83 x10E6/uL (ref 4.14–5.80)
RDW: 12.4 % (ref 11.6–15.4)
WBC: 5.6 x10E3/uL (ref 3.4–10.8)

## 2023-12-27 LAB — COMPREHENSIVE METABOLIC PANEL WITH GFR
ALT: 26 IU/L (ref 0–44)
AST: 31 IU/L (ref 0–40)
Albumin: 4.8 g/dL (ref 3.8–4.8)
Alkaline Phosphatase: 56 IU/L (ref 47–123)
BUN/Creatinine Ratio: 19 (ref 10–24)
BUN: 23 mg/dL (ref 8–27)
Bilirubin Total: 0.5 mg/dL (ref 0.0–1.2)
CO2: 19 mmol/L — ABNORMAL LOW (ref 20–29)
Calcium: 9.8 mg/dL (ref 8.6–10.2)
Chloride: 104 mmol/L (ref 96–106)
Creatinine, Ser: 1.19 mg/dL (ref 0.76–1.27)
Globulin, Total: 2.5 g/dL (ref 1.5–4.5)
Glucose: 132 mg/dL — ABNORMAL HIGH (ref 70–99)
Potassium: 4.6 mmol/L (ref 3.5–5.2)
Sodium: 140 mmol/L (ref 134–144)
Total Protein: 7.3 g/dL (ref 6.0–8.5)
eGFR: 64 mL/min/1.73 (ref >59)

## 2023-12-27 LAB — T4, FREE: Free T4: 0.96 ng/dL (ref 0.82–1.77)

## 2023-12-27 LAB — LIPID PANEL WITH LDL/HDL RATIO
Cholesterol, Total: 142 mg/dL (ref 100–199)
HDL: 40 mg/dL (ref >39)
LDL Chol Calc (NIH): 76 mg/dL (ref 0–99)
LDL/HDL Ratio: 1.9 ratio (ref 0.0–3.6)
Triglycerides: 152 mg/dL — ABNORMAL HIGH (ref 0–149)
VLDL Cholesterol Cal: 26 mg/dL (ref 5–40)

## 2023-12-27 LAB — HEMOGLOBIN A1C
Est. average glucose Bld gHb Est-mCnc: 128 mg/dL
Hgb A1c MFr Bld: 6.1 % — ABNORMAL HIGH (ref 4.8–5.6)

## 2023-12-27 LAB — PSA TOTAL (REFLEX TO FREE): Prostate Specific Ag, Serum: 0.8 ng/mL (ref 0.0–4.0)

## 2023-12-27 LAB — MICROALBUMIN / CREATININE URINE RATIO

## 2023-12-27 LAB — TSH: TSH: 2.34 u[IU]/mL (ref 0.450–4.500)

## 2024-01-01 ENCOUNTER — Ambulatory Visit: Admitting: Internal Medicine

## 2024-01-08 ENCOUNTER — Ambulatory Visit (INDEPENDENT_AMBULATORY_CARE_PROVIDER_SITE_OTHER): Admitting: Internal Medicine

## 2024-01-08 ENCOUNTER — Encounter: Payer: Self-pay | Admitting: Internal Medicine

## 2024-01-08 VITALS — BP 110/67 | HR 96 | Temp 98.0°F | Resp 16 | Ht 69.0 in | Wt 185.0 lb

## 2024-01-08 DIAGNOSIS — K5903 Drug induced constipation: Secondary | ICD-10-CM

## 2024-01-08 DIAGNOSIS — E1159 Type 2 diabetes mellitus with other circulatory complications: Secondary | ICD-10-CM | POA: Diagnosis not present

## 2024-01-08 NOTE — Progress Notes (Signed)
 [facility] [address] [place], [place] [address]  Internal MEDICINE  Office Visit Note  Patient Name: Shawn Greene  [id number]  [id number]  Date of Service: 01/08/2024  Chief Complaint  Patient presents with   Follow-up    Review labs - Diabetes, HTN   Quality Metric Gaps    AWV    HPI Patient is seen today for her diabetes management He has been on Glucotrol  and Actos , hemoglobin A1c has been at target However I wanted to change the medicine to SGLT due to better profile and long-term benefits Patient did not tolerate Doreen, caused him to have constipation and would like to go back on his original medication Glucotrol  and Actos     Current Medication: Outpatient Encounter Medications as of 01/08/2024  Medication Sig   Accu-Chek Softclix Lancets lancets Use 1 lancet to check glucose once daily and as needed for diabetes   aspirin  EC 81 MG tablet Take 81 mg by mouth daily. Swallow whole.   Blood Glucose Monitoring Suppl (ACCU-CHEK GUIDE ME) w/Device KIT Use as directed DX E11.65   DULoxetine  (CYMBALTA ) 60 MG capsule Take 1 capsule (60 mg total) by mouth daily.   ezetimibe  (ZETIA ) 10 MG tablet Take 1 tablet (10 mg total) by mouth daily.   finasteride  (PROSCAR ) 5 MG tablet Take 1 tablet (5 mg total) by mouth daily.   gabapentin  (NEURONTIN ) 800 MG tablet Take 1 tablet (800 mg total) by mouth 3 (three) times daily.   gemfibrozil  (LOPID ) 600 MG tablet Take 1 tablet (600 mg total) by mouth 2 (two) times daily before a meal.   glipiZIDE  (GLUCOTROL  XL) 10 MG 24 hr tablet Take 1 tablet (10 mg total) by mouth daily with breakfast.   glucose blood (ACCU-CHEK GUIDE TEST) test strip Use 1 test strip to check glucose daily as needed for diabetes.   lisinopril  (ZESTRIL ) 20 MG tablet Take 1 tablet (20 mg total) by mouth daily. Take one tab po qd   metoprolol  tartrate (LOPRESSOR ) 50 MG tablet Take 1 tablet (50 mg total) by mouth 2 (two) times daily.   omeprazole  (PRILOSEC) 20  MG capsule Take 1 capsule (20 mg total) by mouth daily.   pioglitazone  (ACTOS ) 15 MG tablet Take 1 tablet (15 mg total) by mouth daily.   tamsulosin  (FLOMAX ) 0.4 MG CAPS capsule TAKE 1 CAPSULE BY MOUTH DAILY   [DISCONTINUED] rosuvastatin (CRESTOR) 5 MG tablet Take one tab twice a week   No facility-administered encounter medications on file as of 01/08/2024.    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     Family History: Family History  Problem Relation Age of Onset   Hypertension Mother     Social History   Socioeconomic History   Marital status: Married    Spouse name: Not on file   Number of children: Not on file   Years of education: Not on file   Highest education level: Not on file  Occupational History   Not on file  Tobacco Use   Smoking status: Never   Smokeless tobacco: Never  Substance and Sexual Activity   Alcohol use: Yes    Alcohol/week: 1.0 standard drink of alcohol    Types: 1 Cans of beer per week    Comment: occ   Drug use: No   Sexual activity: Not on file  Other Topics Concern   Not on file  Social History Narrative   Not on file   Social Drivers of Health   Financial Resource Strain: Low Risk  (05/19/2020)   Overall Financial Resource Strain (CARDIA)    Difficulty of Paying Living Expenses: Not hard at all  Food Insecurity: Not on file  Transportation Needs: Not on file  Physical Activity: Not on file  Stress: Not on  file  Social Connections: Not on file  Intimate Partner Violence: Not on file      Review of Systems  Constitutional:  Negative for fatigue and fever.  HENT:  Negative for congestion, mouth sores and postnasal drip.   Respiratory:  Negative for cough.   Cardiovascular:  Negative for chest pain.  Genitourinary:  Negative for flank pain.  Psychiatric/Behavioral: Negative.      Vital Signs: BP 110/67   Pulse 96   Temp 98 F (36.7 C)   Resp 16   Ht 5' 9 (1.753 m)   Wt 185 lb (83.9 kg)   SpO2 95%   BMI 27.32 kg/m    Physical Exam Constitutional:      Appearance: Normal appearance.  HENT:     Head: Normocephalic and atraumatic.     Nose: Nose normal.     Mouth/Throat:     Mouth: Mucous membranes are moist.     Pharynx: No posterior oropharyngeal erythema.  Eyes:     Extraocular Movements: Extraocular movements intact.     Pupils: Pupils are equal, round, and reactive to light.  Cardiovascular:     Pulses: Normal pulses.     Heart sounds: Normal heart sounds.  Pulmonary:     Effort: Pulmonary effort is normal.     Breath sounds: Normal breath sounds.  Neurological:     General: No focal deficit present.     Mental Status: He is alert.  Psychiatric:        Mood and Affect: Mood normal.        Behavior: Behavior normal.        Assessment/Plan: 1. Type 2 diabetes mellitus with other circulatory complication, without long-term current use of insulin  (HCC) (Primary) Patient wishes to go back on Actos  and Glucotrol  does have patient reports options so we will start as before - Urine Microalbumin w/creat. ratio  2. Drug-induced constipation Patient instructed to increase his water intake and take Dulcolax as needed, this should resolve on its own if this was a side effect of the medication  General Counseling: kamir selover understanding of the findings of todays visit and agrees with plan of treatment. I have discussed any further diagnostic evaluation that  may be needed or ordered today. We also reviewed his medications today. he has been encouraged to call the office with any questions or concerns that should arise related to todays visit.    Orders Placed This Encounter  Procedures   Urine Microalbumin w/creat. ratio    No orders of the defined types were placed in this encounter.   Total time spent:20 Minutes Time spent includes review of chart, medications, test results, and follow  up plan with the patient.   [place] Controlled Substance Database was reviewed by me.   Dr Jaleea Alesi M All[facility] Mcauliff Internal medicine

## 2024-01-09 LAB — MICROALBUMIN / CREATININE URINE RATIO
Creatinine, Urine: 179 mg/dL
Microalb/Creat Ratio: 22 mg/g{creat} (ref 0–29)
Microalbumin, Urine: 39.9 ug/mL

## 2024-01-16 ENCOUNTER — Encounter: Payer: Self-pay | Admitting: Urology

## 2024-01-16 ENCOUNTER — Ambulatory Visit (INDEPENDENT_AMBULATORY_CARE_PROVIDER_SITE_OTHER): Admitting: Urology

## 2024-01-16 VITALS — BP 123/71 | HR 56 | Ht 69.0 in | Wt 184.0 lb

## 2024-01-16 DIAGNOSIS — N401 Enlarged prostate with lower urinary tract symptoms: Secondary | ICD-10-CM

## 2024-01-16 LAB — BLADDER SCAN AMB NON-IMAGING: Scan Result: 16

## 2024-01-16 MED ORDER — FINASTERIDE 5 MG PO TABS: 5.0000 mg | ORAL_TABLET | Freq: Every day | ORAL | 3 refills | Status: AC

## 2024-01-16 NOTE — Progress Notes (Signed)
 01/16/2024 10:02 AM   Shawn Greene 12-24-1950 [id number]  Referring provider: Liana Fish, NP [address] [place],  [place] [address]  Chief Complaint  Patient presents with   Benign Prostatic Hypertrophy   Urologic history: 1.  BPH with LUTS Tamsulosin  0.4 mg daily   2.  Erectile dysfunction PDE 5 inhibitor refractory Declined second line options; ICI/VED   HPI: Shawn Greene is a 73 y.o. 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 If you have any questions, ask your nurse or doctor.          Accu-Chek Guide Me w/Device Kit Use as directed DX E11.65   Accu-Chek Guide Test test strip Generic drug: glucose blood Use 1 test strip to check glucose daily as needed for diabetes.   Accu-Chek Softclix Lancets lancets Use 1 lancet to check glucose once daily and as needed for diabetes   aspirin  EC 81 MG tablet Take 81 mg by mouth daily. Swallow whole.   DULoxetine  60 MG capsule Commonly known as: CYMBALTA  Take 1 capsule (60 mg total) by mouth daily.   ezetimibe  10 MG tablet Commonly known as:  ZETIA  Take 1 tablet (10 mg total) by mouth daily.   finasteride  5 MG tablet Commonly known as: PROSCAR  Take 1 tablet (5 mg total) by mouth daily.   gabapentin  800 MG tablet Commonly known as: NEURONTIN  Take 1 tablet (800 mg total) by mouth 3 (three) times daily.   gemfibrozil  600 MG tablet Commonly known as: LOPID  Take 1 tablet (600 mg total) by mouth 2 (two) times daily before a meal.   glipiZIDE  10 MG 24 hr tablet Commonly known as: GLUCOTROL  XL Take 1 tablet (10 mg total) by mouth daily with breakfast.   lisinopril  20 MG tablet Commonly known as: ZESTRIL  Take 1 tablet (20 mg total) by mouth daily. Take one tab po qd   metoprolol  tartrate 50 MG tablet Commonly known as: LOPRESSOR  Take 1 tablet (50 mg total) by mouth 2 (two) times daily.   omeprazole  20 MG capsule Commonly known as: PRILOSEC Take 1 capsule (20 mg total) by mouth daily.   pioglitazone  15 MG tablet Commonly known as: ACTOS  Take 1 tablet (15 mg total) by mouth daily.   tamsulosin  0.4 MG Caps capsule Commonly known as: FLOMAX  TAKE 1 CAPSULE BY MOUTH DAILY        Allergies:  Allergies  Allergen Reactions   Pollinex-T [Modified Tree Tyrosine Adsorbate]     Family History: Family History  Problem Relation Age of  Onset   Hypertension Mother     Social History:  reports that he has never smoked. He has never used smokeless tobacco. He reports current alcohol use of about 1.0 standard drink of alcohol per week. He reports that he does not use drugs.   Physical Exam: BP 123/71   Pulse (!) 56   Ht 5' 9 (1.753 m)   Wt 184 lb (83.5 kg)   BMI 27.17 kg/m   Constitutional:  Alert, No acute distress. HEENT: [place] AT Respiratory: Normal respiratory effort, no increased work of breathing. Psychiatric: Normal mood and affect.   Assessment & Plan:    1. Benign prostatic hyperplasia with LUTS Stable on tamsulosin /finasteride  Refill sent to pharmacy PVR today 16 mL Continue annual  follow-up   Shawn JAYSON Barba, MD  [facility] [address], [address] [place], [place] [address] [phone]

## 2024-01-18 ENCOUNTER — Ambulatory Visit: Payer: Self-pay | Admitting: Urology

## 2024-02-06 ENCOUNTER — Ambulatory Visit: Payer: Medicare HMO | Admitting: Nurse Practitioner

## 2024-02-06 ENCOUNTER — Encounter: Payer: Self-pay | Admitting: Nurse Practitioner

## 2024-02-06 VITALS — BP 130/76 | HR 86 | Temp 97.6°F | Resp 16 | Ht 69.0 in | Wt 188.2 lb

## 2024-02-06 DIAGNOSIS — E1142 Type 2 diabetes mellitus with diabetic polyneuropathy: Secondary | ICD-10-CM | POA: Diagnosis not present

## 2024-02-06 DIAGNOSIS — K219 Gastro-esophageal reflux disease without esophagitis: Secondary | ICD-10-CM | POA: Diagnosis not present

## 2024-02-06 DIAGNOSIS — E782 Mixed hyperlipidemia: Secondary | ICD-10-CM

## 2024-02-06 DIAGNOSIS — E1159 Type 2 diabetes mellitus with other circulatory complications: Secondary | ICD-10-CM | POA: Diagnosis not present

## 2024-02-06 DIAGNOSIS — E1169 Type 2 diabetes mellitus with other specified complication: Secondary | ICD-10-CM | POA: Insufficient documentation

## 2024-02-06 DIAGNOSIS — Z0001 Encounter for general adult medical examination with abnormal findings: Secondary | ICD-10-CM | POA: Diagnosis not present

## 2024-02-06 DIAGNOSIS — I152 Hypertension secondary to endocrine disorders: Secondary | ICD-10-CM | POA: Diagnosis not present

## 2024-02-06 MED ORDER — PIOGLITAZONE HCL 15 MG PO TABS: 15.0000 mg | ORAL_TABLET | Freq: Every day | ORAL | 1 refills | Status: AC

## 2024-02-06 MED ORDER — DULOXETINE HCL 60 MG PO CPEP: 60.0000 mg | ORAL_CAPSULE | Freq: Every day | ORAL | 1 refills | Status: AC

## 2024-02-06 MED ORDER — GLIPIZIDE ER 10 MG PO TB24: 10.0000 mg | ORAL_TABLET | Freq: Every day | ORAL | 1 refills | Status: AC

## 2024-02-06 MED ORDER — ROSUVASTATIN CALCIUM 5 MG PO TABS: ORAL_TABLET | ORAL | Status: AC

## 2024-02-06 MED ORDER — OMEPRAZOLE 20 MG PO CPDR: 20.0000 mg | DELAYED_RELEASE_CAPSULE | Freq: Every day | ORAL | 1 refills | Status: AC

## 2024-02-06 MED ORDER — GEMFIBROZIL 600 MG PO TABS: 600.0000 mg | ORAL_TABLET | Freq: Two times a day (BID) | ORAL | 1 refills | Status: AC

## 2024-02-06 MED ORDER — GABAPENTIN 800 MG PO TABS: 800.0000 mg | ORAL_TABLET | Freq: Three times a day (TID) | ORAL | 3 refills | Status: AC

## 2024-02-06 MED ORDER — METOPROLOL TARTRATE 50 MG PO TABS: 50.0000 mg | ORAL_TABLET | Freq: Two times a day (BID) | ORAL | 1 refills | Status: DC

## 2024-02-06 NOTE — Progress Notes (Signed)
 [facility] [address] [place], [place] [address]  Internal MEDICINE  Office Visit Note  Patient Name: Shawn Greene  [id number]  [id number]  Date of Service: 02/06/2024  Chief Complaint  Patient presents with   Diabetes   Hypertension   Hyperlipidemia   Medicare Wellness    HPI Shawn Greene presents for a medicare annual wellness visit.  Well-appearing 73 y.o. male with hypertension, diabetes, and high cholesterol  Routine CRC screening: discontinued by GI, last colonoscopy was 2022 Eye exam: done in may this year  foot exam: done  Labs: routine labs done in November A1c is stable at 6.1 Urine microalbumin is normal PSA is normal Fasting glucose 132 Elevated triglycerides 152 HDL improved to normal range New or worsening pain: none, still having chronic left knee pain due to arthritis and bilateral diabetic neuropathy of both feet.  Other concerns: Has tried farxiga and jardiance  but did not tolerate either medication. Farxiga caused constipation and jardiance  caused abdominal pain and upset stomach.      02/06/2024    8:52 AM 02/05/2023    8:37 AM 02/02/2022    9:49 AM  MMSE - Mini Mental State Exam  Orientation to time 5 5 5   Orientation to Place 5 5 5   Registration 3 3 3   Attention/ Calculation 5 5 5   Recall 3 3 3   Language- name 2 objects 2 2 2   Language- repeat 1 1 1   Language- follow 3 step command 3 3 3   Language- read & follow direction 1 1 1   Write a sentence 0 0 1  Copy design 1 1 1   Total score 29 29 30     Functional Status Survey: Is the patient deaf or have difficulty hearing?: Yes Does the patient have difficulty seeing, even when wearing glasses/contacts?: No Does the patient have difficulty concentrating, remembering, or making decisions?: No Does the patient have difficulty walking or climbing stairs?: Yes Does the patient have difficulty dressing or bathing?: No Does the patient have difficulty doing errands alone such as visiting  a doctor's office or shopping?: No     02/02/2022    9:48 AM 05/05/2022    9:43 AM 02/05/2023    8:36 AM 01/08/2024    1:20 PM 02/06/2024    8:51 AM  Fall Risk  Falls in the past year? 0 0 0 0 0  Was there an injury with Fall? 0  0  0   0  Fall Risk Category Calculator 0 0 0  0  Fall Risk Category (Retired) Low       (RETIRED) Patient Fall Risk Level Low fall risk       Patient at Risk for Falls Due to No Fall Risks No Fall Risks No Fall Risks    Fall risk Follow up Falls evaluation completed  Falls evaluation completed Falls evaluation completed  Falls evaluation completed     Data saved with a previous flowsheet row definition       02/06/2024    8:51 AM  Depression screen PHQ 2/9  Decreased Interest 0  Down, Depressed, Hopeless 0  PHQ - 2 Score 0       Current Medication: Outpatient Encounter Medications as of 02/06/2024  Medication Sig   Accu-Chek Softclix Lancets lancets Use 1 lancet to check glucose once daily and as needed for diabetes   aspirin  EC 81 MG tablet Take 81 mg by mouth daily. Swallow whole.   Blood Glucose Monitoring Suppl (ACCU-CHEK GUIDE ME) w/Device KIT  Use as directed DX E11.65   DULoxetine  (CYMBALTA ) 60 MG capsule Take 1 capsule (60 mg total) by mouth daily.   ezetimibe  (ZETIA ) 10 MG tablet Take 1 tablet (10 mg total) by mouth daily.   finasteride  (PROSCAR ) 5 MG tablet Take 1 tablet (5 mg total) by mouth daily.   gabapentin  (NEURONTIN ) 800 MG tablet Take 1 tablet (800 mg total) by mouth 3 (three) times daily.   gemfibrozil  (LOPID ) 600 MG tablet Take 1 tablet (600 mg total) by mouth 2 (two) times daily before a meal.   glipiZIDE  (GLUCOTROL  XL) 10 MG 24 hr tablet Take 1 tablet (10 mg total) by mouth daily with breakfast.   glucose blood (ACCU-CHEK GUIDE TEST) test strip Use 1 test strip to check glucose daily as needed for diabetes.   lisinopril  (ZESTRIL ) 20 MG tablet Take 1 tablet (20 mg total) by mouth daily. Take one tab po qd   metoprolol   tartrate (LOPRESSOR ) 50 MG tablet Take 1 tablet (50 mg total) by mouth 2 (two) times daily.   omeprazole  (PRILOSEC) 20 MG capsule Take 1 capsule (20 mg total) by mouth daily.   pioglitazone  (ACTOS ) 15 MG tablet Take 1 tablet (15 mg total) by mouth daily.   rosuvastatin  (CRESTOR ) 5 MG tablet Take one tab twice a week   tamsulosin  (FLOMAX ) 0.4 MG CAPS capsule TAKE 1 CAPSULE BY MOUTH DAILY   [DISCONTINUED] DULoxetine  (CYMBALTA ) 60 MG capsule Take 1 capsule (60 mg total) by mouth daily.   [DISCONTINUED] gabapentin  (NEURONTIN ) 800 MG tablet Take 1 tablet (800 mg total) by mouth 3 (three) times daily.   [DISCONTINUED] gemfibrozil  (LOPID ) 600 MG tablet Take 1 tablet (600 mg total) by mouth 2 (two) times daily before a meal.   [DISCONTINUED] glipiZIDE  (GLUCOTROL  XL) 10 MG 24 hr tablet Take 1 tablet (10 mg total) by mouth daily with breakfast.   [DISCONTINUED] metoprolol  tartrate (LOPRESSOR ) 50 MG tablet Take 1 tablet (50 mg total) by mouth 2 (two) times daily.   [DISCONTINUED] omeprazole  (PRILOSEC) 20 MG capsule Take 1 capsule (20 mg total) by mouth daily.   [DISCONTINUED] pioglitazone  (ACTOS ) 15 MG tablet Take 1 tablet (15 mg total) by mouth daily.   No facility-administered encounter medications on file as of 02/06/2024.    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     Family History: Family History  Problem Relation Age of Onset   Hypertension Mother     Social  History   Socioeconomic History   Marital status: Married    Spouse name: Not on file   Number of children: Not on file   Years of education: Not on file   Highest education level: Not on file  Occupational History   Not on file  Tobacco Use   Smoking status: Never   Smokeless tobacco: Never  Substance and Sexual Activity   Alcohol use: Yes    Alcohol/week: 1.0 standard drink of alcohol    Types: 1  Cans of beer per week    Comment: occ   Drug use: No   Sexual activity: Not on file  Other Topics Concern   Not on file  Social History Narrative   Not on file   Social Drivers of Health   Tobacco Use: Low Risk (02/06/2024)   Patient History    Smoking Tobacco Use: Never    Smokeless Tobacco Use: Never    Passive Exposure: Not on file  Financial Resource Strain: Not on file  Food Insecurity: Not on file  Transportation Needs: Not on file  Physical Activity: Not on file  Stress: Not on file  Social Connections: Not on file  Intimate Partner Violence: Not on file  Depression (PHQ2-9): Low Risk (02/06/2024)   Depression (PHQ2-9)    PHQ-2 Score: 0  Alcohol Screen: Low Risk (01/08/2024)   Alcohol Screen    Last Alcohol Screening Score (AUDIT): 1  Housing: Not on file  Utilities: Not on file  Health Literacy: Not on file      Review of Systems  Constitutional:  Negative for activity change, appetite change, chills, fatigue, fever and unexpected weight change.  HENT:  Positive for hearing loss and tinnitus. Negative for congestion, ear pain, rhinorrhea, sore throat and trouble swallowing.   Eyes: Negative.   Respiratory: Negative.  Negative for cough, chest tightness, shortness of breath and wheezing.   Cardiovascular: Negative.  Negative for chest pain.  Gastrointestinal: Negative.  Negative for abdominal pain, blood in stool, constipation, diarrhea, nausea and vomiting.  Endocrine: Negative.   Genitourinary: Negative.  Negative for difficulty urinating, dysuria,  frequency, hematuria and urgency.  Musculoskeletal: Negative.  Negative for arthralgias, back pain, joint swelling, myalgias and neck pain.  Skin: Negative.  Negative for rash and wound.  Allergic/Immunologic: Negative.  Negative for immunocompromised state.  Neurological: Negative.  Negative for dizziness, seizures, numbness and headaches.  Hematological: Negative.   Psychiatric/Behavioral: Negative.  Negative for behavioral problems, self-injury and suicidal ideas. The patient is not nervous/anxious.     Vital Signs: BP 130/76   Pulse 86   Temp 97.6 F (36.4 C)   Resp 16   Ht 5' 9 (1.753 m)   Wt 188 lb 3.2 oz (85.4 kg)   SpO2 94%   BMI 27.79 kg/m    Physical Exam Vitals reviewed.  Constitutional:      General: Shawn Greene is awake. Shawn Greene is not in acute distress.    Appearance: Normal appearance. Shawn Greene is well-developed, well-groomed and normal weight. Shawn Greene is not ill-appearing or diaphoretic.  HENT:     Head: Normocephalic and atraumatic.     Mouth/Throat:     Lips: Pink.     Pharynx: Uvula midline.  Eyes:     General: Lids are normal. Vision grossly intact. Gaze aligned appropriately.     Extraocular Movements: Extraocular movements intact.     Conjunctiva/sclera: Conjunctivae normal.     Pupils: Pupils are equal, round, and reactive to light.     Funduscopic exam:    Right eye: Red reflex present.        Left eye: Red reflex present.    Comments: Stye on upper left eyelid  Neck:     Thyroid : No thyromegaly.     Vascular: No JVD.     Trachea: Trachea and phonation normal. No tracheal deviation.  Cardiovascular:     Rate and Rhythm: Normal rate and regular rhythm.     Pulses: Normal pulses.  Dorsalis pedis pulses are 2+ on the right side and 2+ on the left side.       Posterior tibial pulses are 2+ on the right side and 2+ on the left side.     Heart sounds: Normal heart sounds, S1 normal and S2 normal. No murmur heard.    No friction rub. No gallop.  Pulmonary:      Effort: Pulmonary effort is normal. No accessory muscle usage or respiratory distress.     Breath sounds: Normal breath sounds and air entry. No stridor. No wheezing or rales.  Chest:     Chest wall: No tenderness.  Abdominal:     Palpations: There is no shifting dullness, fluid wave or pulsatile mass.  Musculoskeletal:     Right foot: Normal range of motion. No deformity, bunion, Charcot foot, foot drop or prominent metatarsal heads.     Left foot: Normal range of motion. No deformity, bunion, Charcot foot, foot drop or prominent metatarsal heads.  Feet:     Right foot:     Protective Sensation: 6 sites tested.  6 sites sensed.     Skin integrity: Callus and dry skin present. No ulcer, blister, skin breakdown, erythema, warmth or fissure.     Toenail Condition: Right toenails are ingrown.     Left foot:     Protective Sensation: 6 sites tested.  6 sites sensed.     Skin integrity: Callus and dry skin present. No ulcer, blister, skin breakdown, erythema, warmth or fissure.     Toenail Condition: Left toenails are ingrown.  Skin:    Capillary Refill: Capillary refill takes less than 2 seconds.  Neurological:     Mental Status: Shawn Greene is alert and oriented to person, place, and time.     Cranial Nerves: No cranial nerve deficit.     Motor: No abnormal muscle tone.     Coordination: Coordination normal.     Gait: Gait normal.     Deep Tendon Reflexes: Reflexes are normal and symmetric.  Psychiatric:        Mood and Affect: Mood normal.        Behavior: Behavior normal. Behavior is cooperative.        Thought Content: Thought content normal.        Judgment: Judgment normal.        Assessment/Plan: 1. Encounter for Medicare annual examination with abnormal findings (Primary) Age-appropriate preventive screenings and vaccinations discussed, annual physical exam completed. Routine labs for health maintenance results discussed with the patient. PHM updated.    2. Type 2 diabetes  mellitus with other circulatory complication, without long-term current use of insulin  (HCC) A1c is stable at 6.1, continue medications as prescribed. Follow up in march to repeat A1c.  - gemfibrozil  (LOPID ) 600 MG tablet; Take 1 tablet (600 mg total) by mouth 2 (two) times daily before a meal.  Dispense: 180 tablet; Refill: 1 - gabapentin  (NEURONTIN ) 800 MG tablet; Take 1 tablet (800 mg total) by mouth 3 (three) times daily.  Dispense: 270 tablet; Refill: 3 - pioglitazone  (ACTOS ) 15 MG tablet; Take 1 tablet (15 mg total) by mouth daily.  Dispense: 90 tablet; Refill: 1 - glipiZIDE  (GLUCOTROL  XL) 10 MG 24 hr tablet; Take 1 tablet (10 mg total) by mouth daily with breakfast.  Dispense: 90 tablet; Refill: 1  3. Hypertension associated with diabetes (HCC) Stable, continue metoprolol  and lisinopril  as prescribed.  - metoprolol  tartrate (LOPRESSOR ) 50 MG tablet; Take 1 tablet (50 mg total) by mouth  2 (two) times daily.  Dispense: 180 tablet; Refill: 1  4. Mixed hyperlipidemia due to type 2 diabetes mellitus (HCC) Restart rosuvastatin  as prescribed twice weekly, continue gemfibrozil  as prescribed and will consider stopping ezetimibe  later.  - rosuvastatin  (CRESTOR ) 5 MG tablet; Take one tab twice a week - gemfibrozil  (LOPID ) 600 MG tablet; Take 1 tablet (600 mg total) by mouth 2 (two) times daily before a meal.  Dispense: 180 tablet; Refill: 1  5. Diabetic polyneuropathy associated with type 2 diabetes mellitus (HCC) Continue duloxetine  and gabapentin  as prescribed.  - gabapentin  (NEURONTIN ) 800 MG tablet; Take 1 tablet (800 mg total) by mouth 3 (three) times daily.  Dispense: 270 tablet; Refill: 3 - DULoxetine  (CYMBALTA ) 60 MG capsule; Take 1 capsule (60 mg total) by mouth daily.  Dispense: 90 capsule; Refill: 1  6. Gastroesophageal reflux disease without esophagitis Continue omeprazole  as prescribed  - omeprazole  (PRILOSEC) 20 MG capsule; Take 1 capsule (20 mg total) by mouth daily.  Dispense: 90  capsule; Refill: 1     General Counseling: strider vallance understanding of the findings of todays visit and agrees with plan of treatment. I have discussed any further diagnostic evaluation that may be needed or ordered today. We also reviewed his medications today. Shawn Greene has been encouraged to call the office with any questions or concerns that should arise related to todays visit.    No orders of the defined types were placed in this encounter.   Meds ordered this encounter  Medications   rosuvastatin  (CRESTOR ) 5 MG tablet    Sig: Take one tab twice a week   gemfibrozil  (LOPID ) 600 MG tablet    Sig: Take 1 tablet (600 mg total) by mouth 2 (two) times daily before a meal.    Dispense:  180 tablet    Refill:  1   gabapentin  (NEURONTIN ) 800 MG tablet    Sig: Take 1 tablet (800 mg total) by mouth 3 (three) times daily.    Dispense:  270 tablet    Refill:  3   DULoxetine  (CYMBALTA ) 60 MG capsule    Sig: Take 1 capsule (60 mg total) by mouth daily.    Dispense:  90 capsule    Refill:  1   pioglitazone  (ACTOS ) 15 MG tablet    Sig: Take 1 tablet (15 mg total) by mouth daily.    Dispense:  90 tablet    Refill:  1   omeprazole  (PRILOSEC) 20 MG capsule    Sig: Take 1 capsule (20 mg total) by mouth daily.    Dispense:  90 capsule    Refill:  1   metoprolol  tartrate (LOPRESSOR ) 50 MG tablet    Sig: Take 1 tablet (50 mg total) by mouth 2 (two) times daily.    Dispense:  180 tablet    Refill:  1   glipiZIDE  (GLUCOTROL  XL) 10 MG 24 hr tablet    Sig: Take 1 tablet (10 mg total) by mouth daily with breakfast.    Dispense:  90 tablet    Refill:  1    Return in about 3 months (around 05/06/2024) for F/U, Recheck A1C, Amiere Cawley PCP.   Total time spent:30 Minutes Time spent includes review of chart, medications, test results, and follow up plan with the patient.   [place] Controlled Substance Database was reviewed by me.  This patient was seen by Mardy Maxin, FNP-C in collaboration with  Dr. Sigrid Bathe as a part of collaborative care agreement.  Makyra Corprew R. Maxin, MSN, FNP-C  Internal medicine

## 2024-03-17 ENCOUNTER — Other Ambulatory Visit: Payer: Self-pay | Admitting: Nurse Practitioner

## 2024-03-17 DIAGNOSIS — E1159 Type 2 diabetes mellitus with other circulatory complications: Secondary | ICD-10-CM

## 2024-05-09 ENCOUNTER — Ambulatory Visit: Admitting: Nurse Practitioner

## 2025-01-20 ENCOUNTER — Ambulatory Visit: Admitting: Urology

## 2025-02-06 ENCOUNTER — Ambulatory Visit: Admitting: Nurse Practitioner
